# Patient Record
Sex: Male | Born: 1970 | Race: White | Hispanic: No | Marital: Married | Smoking: Former smoker
Health system: Southern US, Community
[De-identification: ages and names within clinical notes are randomized; demographics above are authoritative.]

## PROBLEM LIST (undated history)

## (undated) DIAGNOSIS — F329 Major depressive disorder, single episode, unspecified: Secondary | ICD-10-CM

## (undated) DIAGNOSIS — E785 Hyperlipidemia, unspecified: Secondary | ICD-10-CM

## (undated) DIAGNOSIS — E119 Type 2 diabetes mellitus without complications: Secondary | ICD-10-CM

## (undated) DIAGNOSIS — G4730 Sleep apnea, unspecified: Secondary | ICD-10-CM

## (undated) DIAGNOSIS — F419 Anxiety disorder, unspecified: Secondary | ICD-10-CM

## (undated) DIAGNOSIS — I10 Essential (primary) hypertension: Secondary | ICD-10-CM

## (undated) DIAGNOSIS — Z951 Presence of aortocoronary bypass graft: Secondary | ICD-10-CM

## (undated) DIAGNOSIS — R350 Frequency of micturition: Secondary | ICD-10-CM

## (undated) DIAGNOSIS — R519 Headache, unspecified: Secondary | ICD-10-CM

## (undated) DIAGNOSIS — F32A Depression, unspecified: Secondary | ICD-10-CM

## (undated) DIAGNOSIS — K219 Gastro-esophageal reflux disease without esophagitis: Secondary | ICD-10-CM

## (undated) DIAGNOSIS — R51 Headache: Secondary | ICD-10-CM

## (undated) DIAGNOSIS — I2119 ST elevation (STEMI) myocardial infarction involving other coronary artery of inferior wall: Secondary | ICD-10-CM

## (undated) DIAGNOSIS — I5042 Chronic combined systolic (congestive) and diastolic (congestive) heart failure: Secondary | ICD-10-CM

## (undated) DIAGNOSIS — I255 Ischemic cardiomyopathy: Secondary | ICD-10-CM

## (undated) DIAGNOSIS — Z8701 Personal history of pneumonia (recurrent): Secondary | ICD-10-CM

## (undated) DIAGNOSIS — I2510 Atherosclerotic heart disease of native coronary artery without angina pectoris: Secondary | ICD-10-CM

## (undated) DIAGNOSIS — Z973 Presence of spectacles and contact lenses: Secondary | ICD-10-CM

## (undated) DIAGNOSIS — Z95810 Presence of automatic (implantable) cardiac defibrillator: Secondary | ICD-10-CM

## (undated) HISTORY — DX: Type 2 diabetes mellitus without complications: E11.9

## (undated) HISTORY — DX: Chronic combined systolic (congestive) and diastolic (congestive) heart failure: I50.42

## (undated) HISTORY — DX: Anxiety disorder, unspecified: F41.9

## (undated) HISTORY — DX: Morbid (severe) obesity due to excess calories: E66.01

## (undated) HISTORY — DX: ST elevation (STEMI) myocardial infarction involving other coronary artery of inferior wall: I21.19

## (undated) HISTORY — DX: Essential (primary) hypertension: I10

## (undated) HISTORY — PX: EYE SURGERY: SHX253

## (undated) HISTORY — PX: COLONOSCOPY: SHX174

## (undated) HISTORY — DX: Hyperlipidemia, unspecified: E78.5

## (undated) HISTORY — DX: Atherosclerotic heart disease of native coronary artery without angina pectoris: I25.10

## (undated) HISTORY — DX: Depression, unspecified: F32.A

## (undated) HISTORY — PX: FINGER SURGERY: SHX640

## (undated) HISTORY — DX: Ischemic cardiomyopathy: I25.5

## (undated) HISTORY — DX: Presence of aortocoronary bypass graft: Z95.1

## (undated) HISTORY — DX: Major depressive disorder, single episode, unspecified: F32.9

---
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
## 2007-10-04 DIAGNOSIS — I10 Essential (primary) hypertension: Secondary | ICD-10-CM | POA: Insufficient documentation

## 2008-04-05 DIAGNOSIS — I5042 Chronic combined systolic (congestive) and diastolic (congestive) heart failure: Secondary | ICD-10-CM

## 2008-04-05 HISTORY — PX: CORONARY STENT INTERVENTION: CATH118234

## 2008-04-05 HISTORY — DX: Chronic combined systolic (congestive) and diastolic (congestive) heart failure: I50.42

## 2008-07-07 DIAGNOSIS — I2119 ST elevation (STEMI) myocardial infarction involving other coronary artery of inferior wall: Secondary | ICD-10-CM

## 2008-07-07 HISTORY — DX: ST elevation (STEMI) myocardial infarction involving other coronary artery of inferior wall: I21.19

## 2008-07-08 HISTORY — PX: OTHER SURGICAL HISTORY: SHX169

## 2008-11-03 DIAGNOSIS — I255 Ischemic cardiomyopathy: Secondary | ICD-10-CM | POA: Insufficient documentation

## 2011-04-06 DIAGNOSIS — Z95810 Presence of automatic (implantable) cardiac defibrillator: Secondary | ICD-10-CM

## 2011-04-06 HISTORY — DX: Presence of automatic (implantable) cardiac defibrillator: Z95.810

## 2011-04-06 HISTORY — PX: PACEMAKER INSERTION: SHX728

## 2011-10-04 DIAGNOSIS — I5042 Chronic combined systolic (congestive) and diastolic (congestive) heart failure: Secondary | ICD-10-CM | POA: Insufficient documentation

## 2011-10-04 DIAGNOSIS — Z95810 Presence of automatic (implantable) cardiac defibrillator: Secondary | ICD-10-CM | POA: Insufficient documentation

## 2014-04-05 DIAGNOSIS — Z8701 Personal history of pneumonia (recurrent): Secondary | ICD-10-CM

## 2014-04-05 HISTORY — DX: Personal history of pneumonia (recurrent): Z87.01

## 2014-05-02 ENCOUNTER — Encounter: Payer: Self-pay | Admitting: Neurology

## 2014-05-06 ENCOUNTER — Encounter: Payer: Self-pay | Admitting: Neurology

## 2014-05-06 ENCOUNTER — Ambulatory Visit (INDEPENDENT_AMBULATORY_CARE_PROVIDER_SITE_OTHER): Payer: 59 | Admitting: Neurology

## 2014-05-06 VITALS — BP 124/81 | HR 87 | Temp 98.0°F | Resp 16 | Ht 69.0 in | Wt 300.0 lb

## 2014-05-06 DIAGNOSIS — IMO0002 Reserved for concepts with insufficient information to code with codable children: Secondary | ICD-10-CM

## 2014-05-06 DIAGNOSIS — G4733 Obstructive sleep apnea (adult) (pediatric): Secondary | ICD-10-CM

## 2014-05-06 DIAGNOSIS — Z955 Presence of coronary angioplasty implant and graft: Secondary | ICD-10-CM

## 2014-05-06 DIAGNOSIS — E669 Obesity, unspecified: Secondary | ICD-10-CM

## 2014-05-06 DIAGNOSIS — I5040 Unspecified combined systolic (congestive) and diastolic (congestive) heart failure: Secondary | ICD-10-CM

## 2014-05-06 DIAGNOSIS — Z95810 Presence of automatic (implantable) cardiac defibrillator: Secondary | ICD-10-CM

## 2014-05-06 DIAGNOSIS — E1165 Type 2 diabetes mellitus with hyperglycemia: Secondary | ICD-10-CM

## 2014-05-06 DIAGNOSIS — G4734 Idiopathic sleep related nonobstructive alveolar hypoventilation: Secondary | ICD-10-CM

## 2014-05-06 NOTE — Patient Instructions (Signed)

## 2014-05-06 NOTE — Progress Notes (Signed)
Subjective:    Patient ID: Anibal Quinby is a 44 y.o. male.  HPI     Huston Foley, MD, PhD [facility] [address], [address] [address] [place], [place] [address]  Dear Vonna Kotyk,   I saw your patient, July Nickson, upon your kind request in my neurologic clinic today for initial consultation of his sleep disorder, in particular, concern for underlying obstructive sleep apnea. The patient is unaccompanied today. As you know, Mr. Bohman is a 44 year old right-handed gentleman with an underlying medical history of type 2 diabetes, chronic combined systolic and diastolic heart failure, ischemic cardiomyopathy, morbid obesity, and coronary artery disease, status post MI in 2010 at age 34, status post pacemaker/defibrillator placement in 2013 and s/p cardiac stent placements, who has a history of snoring and reports daytime somnolence. He had a recent overnight pulse oximetry test on 04/22/2014 which I reviewed: Total test time was 6 hours and 37 minutes, average oxygen saturation 91.3%, lowest oxygen saturation 50%, time below 88% saturation was 85.4 minutes.  He moved here from [place]. He tells me that he was actually diagnosed with obstructive sleep apnea with a sleep study over a year ago when he was still residing in [place]. He does not have the actual test results. He was tried on CPAP at night. He had trouble tolerating it but would be willing to come back for diagnosis and treatment with another sleep study. He estimates that his sleep study was well over a year ago. He has gained a lot of weight in the last year because of increase in his insulin dose. He has had trouble with diabetes control. He's had diabetes for about 14 years but thankfully does not endorse any complications from diabetes. He has not seen an ophthalmologist in over a year. He had a recent echocardiogram which he reports showed an EF of 50%. His echocardiogram from August 2014 showed an EF  of 35%. He does not endorse any chest pain or shortness of breath at this time. He endorses loud snoring, gasping sensations while asleep, witnessed apneic pauses and frequent morning headaches.  Her typical bedtime is reported to be around 8 to 9 PM and usual wake time is around 4:30 AM. Sleep onset typically occurs within minutes. He reports feeling poorly rested upon awakening. He wakes up on an average 3 to 4 times in the middle of the night and has to go to the bathroom 3 to 4 times on a typical night. He admits to frequent morning headaches. He has to be at work at 5 AM. He works as an Nature conservation officer at [employer].  He reports excessive daytime somnolence (EDS) and His Epworth Sleepiness Score (ESS) is 18/24 today. He has fallen asleep while driving, in the past, on longer distances. He knows to stop and pull over if he feels sleepy at the wheel. He definitely falls asleep when he is a passenger. He does not take any scheduled. He suspects that his father has sleep apnea but he has not been formally tested. He has a family history of heart disease in his father had heart transplant.  He drinks unsweet tea maybe a glass a day. He drinks alcohol maybe at the most once per month. He quit smoking on 07/07/2008 when he had his heart attack. He had 3 coronary stents placed on 07/08/2008. He denies cataplexy, sleep paralysis, hypnagogic or hypnopompic hallucinations, or sleep attacks. He does not report any vivid dreams, nightmares, dream enactments, or parasomnias,  such as sleep walking but does report some sleep talking.   His wife and 52 yo stepdaughter are still in [place]. He does not have a TV in his bedroom.   His Past Medical History Is Significant For: Past Medical History  Diagnosis Date  . Diabetes mellitus without complication     type ll,uncontrolled with renal complications  . Heart failure     chronic combined systolic and diastolic  . Ischemic cardiomyopathy   . Morbid  obesity   . Atherosclerosis   . Hyperlipemia   . Hypertension     essential  . Cardiac defibrillator in place   . Depression   . Anxiety     His Past Surgical History Is Significant For: Past Surgical History  Procedure Laterality Date  . Eye surgery Left     age 19,to correct lazy eye  . Mastectomy Bilateral 1985    gynecomastia  . Pacemaker insertion  2013    St Jude,implantable defibrillator  . Stents  07/08/2008    3     His Family History Is Significant For: Family History  Problem Relation Age of Onset  . Heart Problems Father     transplant at age 38  . Kidney failure Father   . Dementia Father     His Social History Is Significant For: History   Social History  . Marital Status: Married    Spouse Name: Maralyn Sago    Number of Children: 1  . Years of Education: college   Occupational History  . 1    Social History Main Topics  . Smoking status: Former Games developer  . Smokeless tobacco: Never Used     Comment: quit in 2010  . Alcohol Use: No  . Drug Use: No  . Sexual Activity: None   Other Topics Concern  . None   Social History Narrative    His Allergies Are:  Allergies  Allergen Reactions  . Penicillins Shortness Of Breath  . Sulfa Antibiotics Swelling and Rash  :   His Current Medications Are:  Outpatient Encounter Prescriptions as of 05/06/2014  Medication Sig  . aspirin 325 MG tablet Take 325 mg by mouth daily.  . carvedilol (COREG) 12.5 MG tablet Take 12.5 mg by mouth 2 (two) times daily with a meal.  . CLOPIDOGREL BISULFATE PO Take 75 mg by mouth daily.  Marland Kitchen FLUoxetine (PROZAC) 40 MG capsule Take 40 mg by mouth daily.  . Insulin Detemir (LEVEMIR) 100 UNIT/ML Pen Inject into the skin daily at 10 pm. Inject 40 units  Subcutaneous nightly  . insulin lispro (HUMALOG) 100 UNIT/ML KiwkPen Inject into the skin. Inject 30 units subcutaneous three times daily  . lansoprazole (PREVACID) 15 MG capsule Take 15 mg by mouth daily at 12 noon.  Marland Kitchen LOSARTAN  POTASSIUM PO Take 50 mg by mouth daily.  Marland Kitchen METFORMIN HCL ER PO Take 500 mg by mouth 2 (two) times daily. At bedtime  . oxymetazoline (AFRIN) 0.05 % nasal spray Place into the nose. As needed  . OXYMETAZOLINE HCL, OPHTH, 0.025 % SOLN Apply to eye. 2 sprays each nostril two times daily  . rosuvastatin (CRESTOR) 40 MG tablet Take 40 mg by mouth daily.  :  Review of Systems:  Out of a complete 14 point review of systems, all are reviewed and negative with the exception of these symptoms as listed below:   Review of Systems  Constitutional: Positive for fatigue.  HENT:       Ringing in ears  Respiratory:  Positive for shortness of breath.        Snoring  Genitourinary:       Impotence  Allergic/Immunologic: Positive for environmental allergies.  Neurological: Positive for headaches.       Sleepiness, restless legs  Psychiatric/Behavioral:       Depression, anxiety,, decreased energy, disinterest in activities    Objective:  Neurologic Exam  Physical Exam Physical Examination:   Filed Vitals:   05/06/14 1251  BP: 124/81  Pulse: 87  Temp: 98 F (36.7 C)  Resp: 16    General Examination: The patient is a very pleasant 44 y.o. male in no acute distress. He appears well-developed and well-nourished and adequately groomed. He is obese.   HEENT: Normocephalic, atraumatic, pupils are equal, round and reactive to light and accommodation. Funduscopic exam is normal with sharp disc margins noted. Extraocular tracking is good without limitation to gaze excursion or nystagmus noted. Normal smooth pursuit is noted. Hearing is grossly intact. Tympanic membranes are clear bilaterally. Face is symmetric with normal facial animation and normal facial sensation. Speech is clear with no dysarthria noted. There is no hypophonia. There is no lip, neck/head, jaw or voice tremor. Neck is supple with full range of passive and active motion. There are no carotid bruits on auscultation. Oropharynx exam  reveals: mild mouth dryness, adequate dental hygiene and moderate airway crowding, due to redundant soft palate and elongated uvula, tonsils are 1+. Mallampati is class III. Tongue protrudes centrally and palate elevates symmetrically. Neck size is 18-1/4 inches.   Chest: Clear to auscultation without wheezing, rhonchi or crackles noted.  Heart: S1+S2+0, regular and normal without murmurs, rubs or gallops noted.   Abdomen: Soft, non-tender and non-distended with normal bowel sounds appreciated on auscultation.  Extremities: There is trace pitting edema in the distal lower extremities bilaterally. Pedal pulses are intact.  Skin: Warm and dry without trophic changes noted. There are some varicose veins.  Musculoskeletal: exam reveals no obvious joint deformities, tenderness or joint swelling or erythema.   Neurologically:  Mental status: The patient is awake, alert and oriented in all 4 spheres. His immediate and remote memory, attention, language skills and fund of knowledge are appropriate. There is no evidence of aphasia, agnosia, apraxia or anomia. Speech is clear with normal prosody and enunciation. Thought process is linear. Mood is normal and affect is normal.  Cranial nerves II - XII are as described above under HEENT exam. In addition: shoulder shrug is normal with equal shoulder height noted. Motor exam: Normal bulk, strength and tone is noted. There is no drift, tremor or rebound. Romberg is negative. Reflexes are 2+ throughout. Fine motor skills and coordination: intact with normal finger taps, normal hand movements, normal rapid alternating patting, normal foot taps and normal foot agility.  Cerebellar testing: No dysmetria or intention tremor on finger to nose testing. Heel to shin is unremarkable bilaterally. There is no truncal or gait ataxia.  Sensory exam: intact to light touch, pinprick, vibration, temperature sense in the upper and lower extremities.  Gait, station and balance:  He stands easily. No veering to one side is noted. No leaning to one side is noted. Posture is age-appropriate and stance is narrow based. Gait shows normal stride length and normal pace. No problems turning are noted. He turns en bloc. Tandem walk is unremarkable.               Assessment and Plan:  In summary, Tanor Glaspy is a very pleasant 44 y.o.-year old male  with an underlying medical history of type 2 diabetes, chronic combined systolic and diastolic heart failure, ischemic cardiomyopathy, morbid obesity, and coronary artery disease, status post MI in 2010 at age 89, status post pacemaker/defibrillator placement in 2013 and s/p cardiac stent placements, whose history and physical exam are in keeping with obstructive sleep apnea (OSA). His recent overnight pulse oximetry test shows severe desaturations. I had a long chat with the patient  about my findings and the diagnosis of OSA, its prognosis and treatment options. We talked about medical treatments, surgical interventions and non-pharmacological approaches. I explained in particular the risks and ramifications of untreated moderate to severe OSA, especially with respect to developing cardiovascular disease down the Road, including congestive heart failure, difficult to treat hypertension, cardiac arrhythmias, or stroke. Even type 2 diabetes has, in part, been linked to untreated OSA. Symptoms of untreated OSA include daytime sleepiness, memory problems, mood irritability and mood disorder such as depression and anxiety, lack of energy, as well as recurrent headaches, especially morning headaches. We talked about trying to maintain a healthy lifestyle in general, as well as the importance of weight control. I encouraged the patient to eat healthy, exercise daily and keep well hydrated, to keep a scheduled bedtime and wake time routine, to not skip any meals and eat healthy snacks in between meals. I advised the patient not to drive when feeling  sleepy. I recommended the following at this time: sleep study with potential positive airway pressure titration. (We will score hypopneas at 4% and split the sleep study into diagnostic and treatment portion, if the estimated. 2 hour AHI is >20/h).   I explained the sleep test procedure to the patient and also outlined possible surgical and non-surgical treatment options of OSA, including the use of a custom-made dental device (which would require a referral to a specialist dentist or oral surgeon), upper airway surgical options, such as pillar implants, radiofrequency surgery, tongue base surgery, and UPPP (which would involve a referral to an ENT surgeon). Rarely, jaw surgery such as mandibular advancement may be considered.  I also explained the CPAP treatment option to the patient, who indicated that he would be willing to try CPAP if the need arises. I explained the importance of being compliant with PAP treatment, not only for insurance purposes but primarily to improve His symptoms, and for the patient's long term health benefit, including to reduce His cardiovascular risks. I answered all his questions today and the patientwas in agreement. I would like to see him back after the sleep study is completed and encouraged him to call with any interim questions, concerns, problems or updates.   Thank you very much for allowing me to participate in the care of this nice patient. If I can be of any further assistance to you please do not hesitate to call me at [phone].  Sincerely,   Huston FoleySaima Ezechiel [place]ooksbury, MD, PhD

## 2014-05-25 ENCOUNTER — Encounter: Payer: 59 | Admitting: Neurology

## 2014-05-26 ENCOUNTER — Telehealth: Payer: Self-pay | Admitting: *Deleted

## 2014-05-26 NOTE — Telephone Encounter (Signed)
Pt was called, no answer

## 2014-06-24 ENCOUNTER — Telehealth: Payer: Self-pay | Admitting: Neurology

## 2014-06-24 ENCOUNTER — Ambulatory Visit (INDEPENDENT_AMBULATORY_CARE_PROVIDER_SITE_OTHER): Payer: 59 | Admitting: Neurology

## 2014-06-24 VITALS — BP 137/87 | HR 85 | Resp 14

## 2014-06-24 DIAGNOSIS — R9431 Abnormal electrocardiogram [ECG] [EKG]: Secondary | ICD-10-CM

## 2014-06-24 DIAGNOSIS — G4730 Sleep apnea, unspecified: Secondary | ICD-10-CM

## 2014-06-24 DIAGNOSIS — G4733 Obstructive sleep apnea (adult) (pediatric): Secondary | ICD-10-CM

## 2014-06-24 DIAGNOSIS — G4710 Hypersomnia, unspecified: Secondary | ICD-10-CM

## 2014-06-24 DIAGNOSIS — G479 Sleep disorder, unspecified: Secondary | ICD-10-CM

## 2014-06-24 NOTE — Telephone Encounter (Signed)
Patient has confirmed appointment for tonight's sleep study.

## 2014-06-25 NOTE — Sleep Study (Signed)
Please see the scanned sleep study interpretation located in the Procedure tab within the Chart Review section. 

## 2014-07-10 ENCOUNTER — Telehealth: Payer: Self-pay | Admitting: Neurology

## 2014-07-10 DIAGNOSIS — G4733 Obstructive sleep apnea (adult) (pediatric): Secondary | ICD-10-CM

## 2014-07-10 NOTE — Telephone Encounter (Signed)
Please call and notify patient that the recent sleep study confirmed the diagnosis of severe OSA. He did very well with CPAP during the study with significant improvement of the respiratory events. Therefore, I would like start the patient on CPAP at home. I placed the order in the chart.   Arrange for CPAP set up at home through a DME company of patient's choice and fax/route report to PCP and referring MD (if other than PCP).   The patient will also need a follow up appointment with me in 6-8 weeks post set up that has to be scheduled; help the patient schedule this (in a follow-up slot).   Please re-enforce the importance of compliance with treatment and the need for us to monitor compliance data.   Once you have spoken to the patient and scheduled the return appointment, you may close this encounter, thanks,   Tomorrow Dehaas, MD, PhD [facility] ([facility])    

## 2014-07-11 ENCOUNTER — Encounter: Payer: Self-pay | Admitting: Neurology

## 2014-07-11 ENCOUNTER — Encounter: Payer: Self-pay | Admitting: *Deleted

## 2014-07-11 NOTE — Telephone Encounter (Signed)
Patient contacted our office inquiring of his sleep study results.  The patient was informed that a diagnosis of severe OSA had been determined by Dr. Frances FurbishAthar, yet the CPAP therapy was effective in treatment.  The patient was understanding and was referred to [facility] for CPAP set up.  Dr. Jacinto HalimGanji was faxed a copy of the sleep study results.   Patient instructed to contact our office 6-8 weeks post set up to schedule a follow up appointment.  The patient gave verbal permission to mail a copy of his test results.

## 2014-08-20 ENCOUNTER — Telehealth: Payer: Self-pay | Admitting: Neurology

## 2014-08-20 NOTE — Telephone Encounter (Signed)
pls call patient to make FU appt in sleep clinic. He is not fully compliant with his CPAP and no appt appears to be pending.

## 2014-08-21 NOTE — Telephone Encounter (Signed)
Pt aware of CPAP download results. He made appt for 5/26.

## 2014-08-29 ENCOUNTER — Ambulatory Visit (INDEPENDENT_AMBULATORY_CARE_PROVIDER_SITE_OTHER): Payer: 59 | Admitting: Neurology

## 2014-08-29 ENCOUNTER — Encounter: Payer: Self-pay | Admitting: Neurology

## 2014-08-29 VITALS — BP 110/76 | HR 82 | Resp 18 | Ht 69.0 in | Wt 310.0 lb

## 2014-08-29 DIAGNOSIS — Z955 Presence of coronary angioplasty implant and graft: Secondary | ICD-10-CM | POA: Diagnosis not present

## 2014-08-29 DIAGNOSIS — Z95810 Presence of automatic (implantable) cardiac defibrillator: Secondary | ICD-10-CM | POA: Diagnosis not present

## 2014-08-29 DIAGNOSIS — G4733 Obstructive sleep apnea (adult) (pediatric): Secondary | ICD-10-CM | POA: Diagnosis not present

## 2014-08-29 DIAGNOSIS — E669 Obesity, unspecified: Secondary | ICD-10-CM | POA: Diagnosis not present

## 2014-08-29 DIAGNOSIS — E1165 Type 2 diabetes mellitus with hyperglycemia: Secondary | ICD-10-CM | POA: Diagnosis not present

## 2014-08-29 DIAGNOSIS — Z9989 Dependence on other enabling machines and devices: Principal | ICD-10-CM

## 2014-08-29 DIAGNOSIS — IMO0002 Reserved for concepts with insufficient information to code with codable children: Secondary | ICD-10-CM

## 2014-08-29 NOTE — Patient Instructions (Signed)
Please continue using your CPAP regularly. While your insurance requires that you use CPAP at least 4 hours each night on 70% of the nights, I recommend, that you not skip any nights and use it throughout the night if you can. Getting used to CPAP and staying with the treatment long term does take time and patience and discipline. Untreated obstructive sleep apnea when it is moderate to severe can have an adverse impact on cardiovascular health and raise her risk for heart disease, arrhythmias, hypertension, congestive heart failure, stroke and diabetes. Untreated obstructive sleep apnea causes sleep disruption, nonrestorative sleep, and sleep deprivation. This can have an impact on your day to day functioning and cause daytime sleepiness and impairment of cognitive function, memory loss, mood disturbance, and problems focussing. Using CPAP regularly can improve these symptoms.  I will see you back in 3 months for check up of your sleep apnea. Call or email for questions.

## 2014-08-29 NOTE — Progress Notes (Signed)
Subjective:    Patient ID: Brett Drake is a 44 y.o. male.  HPI     Interim history:   Brett Drake is a 44 year old right-handed gentleman with an underlying medical history of type 2 diabetes, chronic combined systolic and diastolic heart failure, ischemic cardiomyopathy, morbid obesity, and coronary artery disease, status post MI in 2010 at age 50, status post pacemaker/defibrillator placement in 2013 and s/p cardiac stent placements, who presents for follow-up consultation of his obstructive sleep apnea, after his recent sleep study. The patient is unaccompanied today. I first met him on 05/06/2014 at the request of his cardiologist, at which time he reported a prior diagnosis of OSA but intolerance to CPAP in the past. He reported weight gain. He had also had an abnormal overnight pulse oximetry test through his cardiologist's which I reviewed at the time. I invited him back for sleep study. He had a split-night sleep study on 06/24/2014 and went over his test results with him in detail today. His baseline sleep efficiency of was reduced at 65.4% with a latency to sleep of 16.5 minutes and wake after sleep onset of 26 minutes with moderate sleep fragmentation noted. He had absence of slow-wave sleep and absence of REM sleep during the baseline portion of the study. He had frequent PVCs and PACs on EKG. He had moderate snoring. Total AHI was highly elevated at 100.6 per hour. Average oxygen saturation was 90%, nadir was 70%. He was therefore titrated on CPAP during the later portion of the study. His arousal index improved. He achieved slow-wave sleep and REM sleep. Average oxygen saturations improved to 94%, nadir was 85%. He was titrated on CPAP from 5 cm to 10 cm of water pressure, AHI was reduced to 5.9 events per hour at the final pressure with supine REM sleep achieved. Based on the test results I prescribed CPAP therapy for home use at a pressure of 11 cm due to residual sleep disordered  breathing noted on the final pressure of 10 cm.  Today, 08/29/2014: I reviewed his CPAP compliance data from 07/29/2014 through 08/27/2014 which is a total of 30 days during which time he used his machine 27 days with percent used days rated and 4 hours at 47%, indicating suboptimal compliance with an average usage for all nights of 3 hours and 24 minutes only. Residual AHI good at 2.6 per hour with leak low at 2.2 L/m for the 95th percentile and a pressure of 11 cm with EPR of 2. It does look like he has increased his CPAP usage in the last 2 weeks.  Today, 08/29/2014: He reports that he had difficulty adjusting to CPAP but he is getting better with it. In the past 10-14 days he has used CPAP consistently. He had the nasal pillows initially but had sores around the nostrils and did not like the nasal pillows. He switched to the nose mask which he is tolerating well. He feels better rested. He wakes up with more energy. He is overall pleased with how he is doing. He has been seen a nutritionist and has been able to lose about 5 pounds so far. He had some blood in the stool. He is scheduled for colonoscopy next week. He has a family history of colon cancer. His hemoglobin A1c has come down from 11 previously to 8.8, which was checked this week. He has had some changes in his diabetes medications.  Previously:   He has a history of snoring and reports daytime somnolence. He  had an overnight pulse oximetry test on 04/22/2014: Total test time was 6 hours and 37 minutes, average oxygen saturation 91.3%, lowest oxygen saturation 50%, time below 88% saturation was 85.4 minutes.  He moved here from [place]. He tells me that he was actually diagnosed with obstructive sleep apnea with a sleep study over a year ago when he was still residing in [place]. He does not have the actual test results. He was tried on CPAP at night. He had trouble tolerating it but would be willing to come back for diagnosis and treatment  with another sleep study. He estimates that his sleep study was well over a year ago. He has gained a lot of weight in the last year because of increase in his insulin dose. He has had trouble with diabetes control. He's had diabetes for about 14 years but thankfully does not endorse any complications from diabetes. He has not seen an ophthalmologist in over a year. He had a recent echocardiogram which he reports showed an EF of 50%. His echocardiogram from August 2014 showed an EF of 35%. He does not endorse any chest pain or shortness of breath at this time. He endorses loud snoring, gasping sensations while asleep, witnessed apneic pauses and frequent morning headaches.  Her typical bedtime is reported to be around 8 to 9 PM and usual wake time is around 4:30 AM. Sleep onset typically occurs within minutes. He reports feeling poorly rested upon awakening. He wakes up on an average 3 to 4 times in the middle of the night and has to go to the bathroom 3 to 4 times on a typical night. He admits to frequent morning headaches. He has to be at work at 5 AM. He works as an Sales promotion account executive at [employer]  He reports excessive daytime somnolence (EDS) and His Epworth Sleepiness Score (ESS) is 18/24 today. He has fallen asleep while driving, in the past, on longer distances. He knows to stop and pull over if he feels sleepy at the wheel. He definitely falls asleep when he is a passenger. He does not take any scheduled. He suspects that his father has sleep apnea but he has not been formally tested. He has a family history of heart disease in his father had heart transplant.   He drinks unsweet tea maybe a glass a day. He drinks alcohol maybe at the most once per month. He quit smoking on 07/07/2008 when he had his heart attack. He had 3 coronary stents placed on 07/08/2008. He denies cataplexy, sleep paralysis, hypnagogic or hypnopompic hallucinations, or sleep attacks. He does not report any vivid dreams,  nightmares, dream enactments, or parasomnias, such as sleep walking but does report some sleep talking.   His wife and 24 yo stepdaughter are still in [place]. He does not have a TV in his bedroom.    His Past Medical History Is Significant For: Past Medical History  Diagnosis Date  . Diabetes mellitus without complication     type ll,uncontrolled with renal complications  . Heart failure     chronic combined systolic and diastolic  . Ischemic cardiomyopathy   . Morbid obesity   . Atherosclerosis   . Hyperlipemia   . Hypertension     essential  . Cardiac defibrillator in place   . Depression   . Anxiety     His Past Surgical History Is Significant For: Past Surgical History  Procedure Laterality Date  . Eye surgery Left     age  10,to correct lazy eye  . Mastectomy Bilateral 1985    gynecomastia  . Pacemaker insertion  2013    [employer] Jude,implantable defibrillator  . Stents  07/08/2008    3     His Family History Is Significant For: Family History  Problem Relation Age of Onset  . Heart Problems Father     transplant at age 23  . Kidney failure Father   . Dementia Father     His Social History Is Significant For: History   Social History  . Marital Status: Married    Spouse Name: Judson Roch  . Number of Children: 1  . Years of Education: college   Occupational History  . 1    Social History Main Topics  . Smoking status: Former Research scientist (life sciences)  . Smokeless tobacco: Never Used     Comment: quit in 2010  . Alcohol Use: No  . Drug Use: No  . Sexual Activity: Not on file   Other Topics Concern  . None   Social History Narrative    His Allergies Are:  Allergies  Allergen Reactions  . Penicillins Shortness Of Breath  . Sulfa Antibiotics Swelling and Rash  :   His Current Medications Are:  Outpatient Encounter Prescriptions as of 08/29/2014  Medication Sig  . aspirin 325 MG tablet Take 325 mg by mouth daily.  . carvedilol (COREG) 12.5 MG tablet Take 12.5 mg by  mouth 2 (two) times daily with a meal.  . CLOPIDOGREL BISULFATE PO Take 75 mg by mouth daily.  Marland Kitchen FLUoxetine (PROZAC) 40 MG capsule Take 40 mg by mouth daily.  . Insulin Degludec 200 UNIT/ML SOPN Inject into the skin.  Marland Kitchen insulin lispro (HUMALOG) 100 UNIT/ML KiwkPen Inject into the skin. Inject 30 units subcutaneous three times daily  . lansoprazole (PREVACID) 15 MG capsule Take 15 mg by mouth daily at 12 noon.  Marland Kitchen LOSARTAN POTASSIUM PO Take 50 mg by mouth daily.  Marland Kitchen METFORMIN HCL ER PO Take 500 mg by mouth 2 (two) times daily. At bedtime  . oxymetazoline (AFRIN) 0.05 % nasal spray Place into the nose. As needed  . OXYMETAZOLINE HCL, OPHTH, 0.025 % SOLN Apply to eye. 2 sprays each nostril two times daily  . rosuvastatin (CRESTOR) 40 MG tablet Take 40 mg by mouth daily.  . [DISCONTINUED] Insulin Detemir (LEVEMIR) 100 UNIT/ML Pen Inject into the skin daily at 10 pm. Inject 40 units  Subcutaneous nightly   No facility-administered encounter medications on file as of 08/29/2014.  : Review of Systems:  Out of a complete 14 point review of systems, all are reviewed and negative with the exception of these symptoms as listed below:  Review of Systems  All other systems reviewed and are negative.  Objective:  Neurologic Exam  Physical Exam Physical Examination:   Filed Vitals:   08/29/14 1521  BP: 110/76  Pulse: 82  Resp: 18   General Examination: The patient is a very pleasant 44 y.o. male in no acute distress. He appears well-developed and well-nourished and adequately groomed. He is obese.   HEENT: Normocephalic, atraumatic, pupils are equal, round and reactive to light and accommodation. Funduscopic exam is normal with sharp disc margins noted. Extraocular tracking is good without limitation to gaze excursion or nystagmus noted. Normal smooth pursuit is noted. Hearing is grossly intact. Face is symmetric with normal facial animation and normal facial sensation. Speech is clear with no  dysarthria noted. There is no hypophonia. There is no lip, neck/head, jaw or voice  tremor. Neck is supple with full range of passive and active motion. There are no carotid bruits on auscultation. Oropharynx exam reveals: mild mouth dryness, adequate dental hygiene and moderate airway crowding, due to redundant soft palate and elongated uvula, tonsils are 1+. Mallampati is class III. Tongue protrudes centrally and palate elevates symmetrically.    Chest: Clear to auscultation without wheezing, rhonchi or crackles noted.  Heart: S1+S2+0, regular and normal without murmurs, rubs or gallops noted.   Abdomen: Soft, non-tender and non-distended with normal bowel sounds appreciated on auscultation.  Extremities: There is no pitting edema in the distal lower extremities bilaterally. Pedal pulses are intact.  Skin: Warm and dry without trophic changes noted. There are some varicose veins.  Musculoskeletal: exam reveals no obvious joint deformities, tenderness or joint swelling or erythema.   Neurologically:  Mental status: The patient is awake, alert and oriented in all 4 spheres. His immediate and remote memory, attention, language skills and fund of knowledge are appropriate. There is no evidence of aphasia, agnosia, apraxia or anomia. Speech is clear with normal prosody and enunciation. Thought process is linear. Mood is normal and affect is normal.  Cranial nerves II - XII are as described above under HEENT exam. In addition: shoulder shrug is normal with equal shoulder height noted. Motor exam: Normal bulk, strength and tone is noted. There is no drift, tremor or rebound. Romberg is negative. Reflexes are 2+ throughout. Fine motor skills and coordination: intact with normal finger taps, normal hand movements, normal rapid alternating patting, normal foot taps and normal foot agility.  Cerebellar testing: No dysmetria or intention tremor on finger to nose testing. Heel to shin is unremarkable  bilaterally. There is no truncal or gait ataxia.  Sensory exam: intact to light touch, pinprick, vibration, temperature sense in the upper and lower extremities.  Gait, station and balance: He stands easily. No veering to one side is noted. No leaning to one side is noted. Posture is age-appropriate and stance is narrow based. Gait shows normal stride length and normal pace. No problems turning are noted. He turns en bloc. Tandem walk is unremarkable.               Assessment and Plan:  In summary, Brett Drake is a very pleasant 44 year old male with an underlying medical history of type 2 diabetes, chronic combined systolic and diastolic heart failure, ischemic cardiomyopathy, morbid obesity, and coronary artery disease, status post MI in 2010 at age 66, status post pacemaker/defibrillator placement in 2013 and s/p cardiac stent placements, who presents for follow-up consultation of his severe obstructive sleep apnea. He had evidence of severe desaturations, as low as 70%. He has done well with CPAP therapy on a pressure of 11 cm. He had some initial difficulty adjusting to the treatment and has a prior history of CPAP intolerance when he was still living in [place]. Given his previous CPAP intolerance he has done great. He is advised about his recent split-night sleep study results in detail and we also went over his compliance data. His compliance is indeed improved in the last 2 weeks. He is encouraged to continue using CPAP regularly. He is encouraged to try to pursue weight loss and congratulated on his recent weight loss success. His hemoglobin A1c has also come down some. He is working with his primary care provider on his diabetes control. His exam for me is stable and he is reassured.  I again had a long chat with the patient about my  findings and the diagnosis of OSA, its prognosis and treatment options. We talked about medical treatments, surgical interventions and non-pharmacological  approaches. I explained in particular the risks and ramifications of untreated moderate to severe OSA, especially with respect to developing cardiovascular disease down the Road, including congestive heart failure, difficult to treat hypertension, cardiac arrhythmias, or stroke. Even type 2 diabetes has, in part, been linked to untreated OSA. Symptoms of untreated OSA include daytime sleepiness, memory problems, mood irritability and mood disorder such as depression and anxiety, lack of energy, as well as recurrent headaches, especially morning headaches. We talked about trying to maintain a healthy lifestyle in general, as well as the importance of weight control. I encouraged the patient to eat healthy, exercise daily and keep well hydrated, to keep a scheduled bedtime and wake time routine, to not skip any meals and eat healthy snacks in between meals. I advised the patient not to drive when feeling sleepy. I recommended the following at this time: continue CPAP therapy at the current settings on the current mask. I explained the importance of being compliant with PAP treatment, not only for insurance purposes but primarily to improve His symptoms, and for the patient's long term health benefit, including to reduce His cardiovascular risks. I answered all his questions today and the patientwas in agreement. I would like to see him back in 3 months, sooner if needed and encouraged him to call with any interim questions, concerns, problems or updates.  I spent 25 minutes in total face-to-face time with the patient, more than 50% of which was spent in counseling and coordination of care, reviewing test results, reviewing medication and discussing or reviewing the diagnosis of OSA, its prognosis and treatment options.

## 2014-12-04 ENCOUNTER — Ambulatory Visit (INDEPENDENT_AMBULATORY_CARE_PROVIDER_SITE_OTHER): Payer: 59 | Admitting: Neurology

## 2014-12-04 ENCOUNTER — Encounter: Payer: Self-pay | Admitting: Neurology

## 2014-12-04 VITALS — BP 122/74 | HR 78 | Resp 18 | Ht 69.0 in | Wt 317.0 lb

## 2014-12-04 DIAGNOSIS — E669 Obesity, unspecified: Secondary | ICD-10-CM

## 2014-12-04 DIAGNOSIS — Z955 Presence of coronary angioplasty implant and graft: Secondary | ICD-10-CM | POA: Diagnosis not present

## 2014-12-04 DIAGNOSIS — Z95810 Presence of automatic (implantable) cardiac defibrillator: Secondary | ICD-10-CM | POA: Diagnosis not present

## 2014-12-04 DIAGNOSIS — Z9989 Dependence on other enabling machines and devices: Principal | ICD-10-CM

## 2014-12-04 DIAGNOSIS — G4733 Obstructive sleep apnea (adult) (pediatric): Secondary | ICD-10-CM | POA: Diagnosis not present

## 2014-12-04 NOTE — Progress Notes (Signed)
Subjective:    Patient ID: Brett Drake is a 44 y.o. male.  HPI     Interim history:   Brett Drake is a 44 year old right-handed gentleman with an underlying medical history of type 2 diabetes, chronic combined systolic and diastolic heart failure, ischemic cardiomyopathy, morbid obesity, and coronary artery disease, status post MI in 2010 at age 34, status post pacemaker/defibrillator placement in 2013 and s/p cardiac stent placements, who presents for follow-up consultation of his obstructive sleep apnea, on treatment with CPAP. The patient is unaccompanied today. I last saw him on 08/29/2014, at which time we talked about the sleep test results and his compliance data with CPAP therapy. He reported that he had some difficulty adjusting to CPAP therapy but he was getting better. He had used CPAP fairly consistently in the previous 2 weeks and preferred the nasal mask. He felt better rested. He felt that he had more daytime energy and overall was quite pleased with how he was doing. He was able to lose some weight. He was scheduled for a colonoscopy soon. His hemoglobin A1c had come down from 11 to 8.8. I encouraged him to be fully compliant with treatment. I commended him for trying and the fact that he had overall done better than in the past when he was tried on CPAP.  Today, 12/04/2014: I reviewed his CPAP compliance data from 11/03/2014 through 12/02/2014 which is a total of 30 days during which time he used his machine every night with percent used days greater than 4 hours at 90%, indicating excellent compliance with an average usage of 5 hours and 47 minutes, residual AHI at 3.4 per hour, leak low with the 95th percentile at 4.7 L/m on a pressure of 11 cm with EPR of 3.  Today, 12/04/2014: He reports doing better. He is compliant with treatment. He has done much better with sleeping with the mask on. He feels better rested. He is still struggling with his weight. Diabetes numbers have  improved. Unfortunately, his insurance did not cover a recent trial of a new diabetes medication. His cardiologist placed him on Lasix. He is scheduled to see a new endocrinologist in December.  Previously:  I first met him on 05/06/2014 at the request of his cardiologist, at which time he reported a prior diagnosis of OSA but intolerance to CPAP in the past. He reported weight gain. He had also had an abnormal overnight pulse oximetry test through his cardiologist's which I reviewed at the time. I invited him back for sleep study. He had a split-night sleep study on 06/24/2014 and went over his test results with him in detail today. His baseline sleep efficiency of was reduced at 65.4% with a latency to sleep of 16.5 minutes and wake after sleep onset of 26 minutes with moderate sleep fragmentation noted. He had absence of slow-wave sleep and absence of REM sleep during the baseline portion of the study. He had frequent PVCs and PACs on EKG. He had moderate snoring. Total AHI was highly elevated at 100.6 per hour. Average oxygen saturation was 90%, nadir was 70%. He was therefore titrated on CPAP during the later portion of the study. His arousal index improved. He achieved slow-wave sleep and REM sleep. Average oxygen saturations improved to 94%, nadir was 85%. He was titrated on CPAP from 5 cm to 10 cm of water pressure, AHI was reduced to 5.9 events per hour at the final pressure with supine REM sleep achieved. Based on the test results I  prescribed CPAP therapy for home use at a pressure of 11 cm due to residual sleep disordered breathing noted on the final pressure of 10 cm.  I reviewed his CPAP compliance data from 07/29/2014 through 08/27/2014 which is a total of 30 days during which time he used his machine 27 days with percent used days rated and 4 hours at 47%, indicating suboptimal compliance with an average usage for all nights of 3 hours and 24 minutes only. Residual AHI good at 2.6 per hour with  leak low at 2.2 L/m for the 95th percentile and a pressure of 11 cm with EPR of 2. It does look like he has increased his CPAP usage in the last 2 weeks.   He has a history of snoring and reports daytime somnolence. He had an overnight pulse oximetry test on 04/22/2014: Total test time was 6 hours and 37 minutes, average oxygen saturation 91.3%, lowest oxygen saturation 50%, time below 88% saturation was 85.4 minutes.   He moved here from [place]. He tells me that he was actually diagnosed with obstructive sleep apnea with a sleep study over a year ago when he was still residing in [place]. He does not have the actual test results. He was tried on CPAP at night. He had trouble tolerating it but would be willing to come back for diagnosis and treatment with another sleep study. He estimates that his sleep study was well over a year ago. He has gained a lot of weight in the last year because of increase in his insulin dose. He has had trouble with diabetes control. He's had diabetes for about 14 years but thankfully does not endorse any complications from diabetes. He has not seen an ophthalmologist in over a year. He had a recent echocardiogram which he reports showed an EF of 50%. His echocardiogram from August 2014 showed an EF of 35%. He does not endorse any chest pain or shortness of breath at this time. He endorses loud snoring, gasping sensations while asleep, witnessed apneic pauses and frequent morning headaches.  Her typical bedtime is reported to be around 8 to 9 PM and usual wake time is around 4:30 AM. Sleep onset typically occurs within minutes. He reports feeling poorly rested upon awakening. He wakes up on an average 3 to 4 times in the middle of the night and has to go to the bathroom 3 to 4 times on a typical night. He admits to frequent morning headaches. He has to be at work at 5 AM. He works as an Sales promotion account executive at [employer]  He reports excessive daytime somnolence (EDS) and  His Epworth Sleepiness Score (ESS) is 18/24 today. He has fallen asleep while driving, in the past, on longer distances. He knows to stop and pull over if he feels sleepy at the wheel. He definitely falls asleep when he is a passenger. He does not take any scheduled. He suspects that his father has sleep apnea but he has not been formally tested. He has a family history of heart disease in his father had heart transplant.   He drinks unsweet tea maybe a glass a day. He drinks alcohol maybe at the most once per month. He quit smoking on 07/07/2008 when he had his heart attack. He had 3 coronary stents placed on 07/08/2008. He denies cataplexy, sleep paralysis, hypnagogic or hypnopompic hallucinations, or sleep attacks. He does not report any vivid dreams, nightmares, dream enactments, or parasomnias, such as sleep walking but does report  some sleep talking.   His wife and 62 yo stepdaughter are still in [place]. He does not have a TV in his bedroom.    His Past Medical History Is Significant For: Past Medical History  Diagnosis Date  . Diabetes mellitus without complication     type ll,uncontrolled with renal complications  . Heart failure     chronic combined systolic and diastolic  . Ischemic cardiomyopathy   . Morbid obesity   . Atherosclerosis   . Hyperlipemia   . Hypertension     essential  . Cardiac defibrillator in place   . Depression   . Anxiety     His Past Surgical History Is Significant For: Past Surgical History  Procedure Laterality Date  . Eye surgery Left     age 74,to correct lazy eye  . Mastectomy Bilateral 1985    gynecomastia  . Pacemaker insertion  2013    [employer] Jude,implantable defibrillator  . Stents  07/08/2008    3     His Family History Is Significant For: Family History  Problem Relation Age of Onset  . Heart Problems Father     transplant at age 52  . Kidney failure Father   . Dementia Father     His Social History Is Significant For: Social  History   Social History  . Marital Status: Married    Spouse Name: Judson Roch  . Number of Children: 1  . Years of Education: college   Occupational History  . 1    Social History Main Topics  . Smoking status: Former Research scientist (life sciences)  . Smokeless tobacco: Never Used     Comment: quit in 2010  . Alcohol Use: No  . Drug Use: No  . Sexual Activity: Not Asked   Other Topics Concern  . None   Social History Narrative    His Allergies Are:  Allergies  Allergen Reactions  . Penicillins Shortness Of Breath  . Sulfa Antibiotics Swelling and Rash  :  His Current Medications Are:  Outpatient Encounter Prescriptions as of 12/04/2014  Medication Sig  . aspirin 325 MG tablet Take 325 mg by mouth daily.  . carvedilol (COREG) 12.5 MG tablet Take 12.5 mg by mouth 2 (two) times daily with a meal.  . carvedilol (COREG) 12.5 MG tablet Take 25 mg by mouth.  . CLOPIDOGREL BISULFATE PO Take 75 mg by mouth daily.  Marland Kitchen FLUoxetine (PROZAC) 40 MG capsule Take 40 mg by mouth daily.  . furosemide (LASIX) 40 MG tablet   . HUMALOG KWIKPEN 200 UNIT/ML SOPN   . insulin lispro (HUMALOG) 100 UNIT/ML KiwkPen Inject into the skin. Inject 30 units subcutaneous three times daily  . lansoprazole (PREVACID) 15 MG capsule Take 15 mg by mouth daily at 12 noon.  Marland Kitchen LOSARTAN POTASSIUM PO Take 50 mg by mouth daily.  Marland Kitchen METFORMIN HCL ER PO Take 500 mg by mouth 2 (two) times daily. At bedtime  . oxymetazoline (AFRIN) 0.05 % nasal spray Place into the nose. As needed  . OXYMETAZOLINE HCL, OPHTH, 0.025 % SOLN Apply to eye. 2 sprays each nostril two times daily  . rosuvastatin (CRESTOR) 40 MG tablet Take 40 mg by mouth daily.   No facility-administered encounter medications on file as of 12/04/2014.  :  Review of Systems:  Out of a complete 14 point review of systems, all are reviewed and negative with the exception of these symptoms as listed below:   Review of Systems  Neurological:  Patient states that he is doing well  on CPAP, no complaints or concerns.     Objective:  Neurologic Exam  Physical Exam Physical Examination:   Filed Vitals:   12/04/14 1619  BP: 122/74  Pulse: 78  Resp: 18   General Examination: The patient is a very pleasant 44 y.o. male in no acute distress. He appears well-developed and well-nourished and adequately groomed. He is obese. He is in good spirits today.  HEENT: Normocephalic, atraumatic, pupils are equal, round and reactive to light and accommodation. Extraocular tracking is good without limitation to gaze excursion or nystagmus noted. Normal smooth pursuit is noted. Hearing is grossly intact. Face is symmetric with normal facial animation and normal facial sensation. Speech is clear with no dysarthria noted. There is no hypophonia. There is no lip, neck/head, jaw or voice tremor. Neck is supple with full range of passive and active motion. There are no carotid bruits on auscultation. Oropharynx exam reveals: mild mouth dryness, adequate dental hygiene and moderate airway crowding, due to redundant soft palate and elongated uvula, tonsils are 1+. Mallampati is class III. Tongue protrudes centrally and palate elevates symmetrically.    Chest: Clear to auscultation without wheezing, rhonchi or crackles noted.  Heart: S1+S2+0, regular and normal without murmurs, rubs or gallops noted.   Abdomen: Soft, non-tender and non-distended with normal bowel sounds appreciated on auscultation.  Extremities: There is no pitting edema in the distal lower extremities bilaterally. Pedal pulses are intact.  Skin: Warm and dry without trophic changes noted. There are some varicose veins.  Musculoskeletal: exam reveals no obvious joint deformities, tenderness or joint swelling or erythema.   Neurologically:  Mental status: The patient is awake, alert and oriented in all 4 spheres. His immediate and remote memory, attention, language skills and fund of knowledge are appropriate. There is no  evidence of aphasia, agnosia, apraxia or anomia. Speech is clear with normal prosody and enunciation. Thought process is linear. Mood is normal and affect is normal.  Cranial nerves II - XII are as described above under HEENT exam. In addition: shoulder shrug is normal with equal shoulder height noted.  Motor exam: Normal bulk, strength and tone is noted. There is no drift, tremor or rebound. Romberg is negative. Reflexes are 2+ throughout. Fine motor skills and coordination: intact with normal finger taps, normal hand movements, normal rapid alternating patting, normal foot taps and normal foot agility.  Cerebellar testing: No dysmetria or intention tremor on finger to nose testing. Heel to shin is unremarkable bilaterally. There is no truncal or gait ataxia.  Sensory exam: intact to light touch in the upper and lower extremities.  Gait, station and balance: He stands easily. No veering to one side is noted. No leaning to one side is noted. Posture is age-appropriate and stance is narrow based. Gait shows normal stride length and normal pace. No problems turning are noted. He turns en bloc. Tandem walk is unremarkable.               Assessment and Plan:  In summary, Brett Drake is a very pleasant 44 year old male with an underlying medical history of type 2 diabetes, chronic combined systolic and diastolic heart failure, ischemic cardiomyopathy, morbid obesity, and coronary artery disease, status post MI in 2010 at age 75, status post pacemaker/defibrillator placement in 2013 and s/p cardiac stent placements, who presents for follow-up consultation of his severe obstructive sleep apnea, now established on CPAP therapy at a pressure of 11 cm with excellent  compliance at this time. He has improved his treatment adherence quite a bit in the last 3 months. He is congratulated on his CPAP compliance and encouraged to continue with treatment without skipping nights and trying to keep the mask on all  night long. He had evidence of severe desaturations, as low as 70% without treatment and these improved significantly with CPAP. He endorses improvement of his sleep and daytime energy level. He did have some initial difficulty adjusting to the treatment and has a prior history of CPAP intolerance when he was still living in [place]. Given his previous CPAP intolerance he has done very well. We briefly went over his sleep study results from March of this year again today.  I explained the importance of being compliant with PAP treatment, not only for insurance purposes but primarily to improve His symptoms, and for the patient's long term health benefit, including to reduce His cardiovascular risks. He is encouraged to work hard on his weight loss and ongoing better diabetes control. He is scheduled to see an endocrinologist in December of this year. From my end of things he has done very well and I suggested a one-year checkup for sleep apnea. I answered all his questions today and he was in agreement. I spent 20 minutes in total face-to-face time with the patient, more than 50% of which was spent in counseling and coordination of care, reviewing test results, reviewing medication and discussing or reviewing the diagnosis of OSA, its prognosis and treatment options.

## 2014-12-04 NOTE — Patient Instructions (Addendum)
Please continue using your CPAP regularly. While your insurance requires that you use CPAP at least 4 hours each night on 70% of the nights, I recommend, that you not skip any nights and use it throughout the night if you can. Getting used to CPAP and staying with the treatment long term does take time and patience and discipline. Untreated obstructive sleep apnea when it is moderate to severe can have an adverse impact on cardiovascular health and raise her risk for heart disease, arrhythmias, hypertension, congestive heart failure, stroke and diabetes. Untreated obstructive sleep apnea causes sleep disruption, nonrestorative sleep, and sleep deprivation. This can have an impact on your day to day functioning and cause daytime sleepiness and impairment of cognitive function, memory loss, mood disturbance, and problems focussing. Using CPAP regularly can improve these symptoms.  Keep up the good work! I will see you back in 12 months for sleep apnea check up.   Please continue to work on improving your diabetes numbers and weight loss.

## 2015-11-23 DIAGNOSIS — I472 Ventricular tachycardia: Secondary | ICD-10-CM

## 2015-11-23 DIAGNOSIS — I2510 Atherosclerotic heart disease of native coronary artery without angina pectoris: Secondary | ICD-10-CM | POA: Diagnosis present

## 2015-11-23 DIAGNOSIS — I4729 Other ventricular tachycardia: Secondary | ICD-10-CM

## 2015-11-23 NOTE — H&P (Signed)
OFFICE VISIT NOTES COPIED TO EPIC FOR DOCUMENTATION  . History of Present Illness Brett Drake AGNP-C; 11/21/2015 7:36 AM) The patient is a 45 year old male who presents for a Follow-up for CAD. He states he had MI in 2010 at age 69. Pt states that he has had no symptoms since MI at age 14. He had a pacemaker/defibrillator placed in 2013.  He denies any chest pain, shortness of breath, PND, orthopnea, edema, palpitations, or symptoms suggestive of claudication or TIA. His lipids and blood pressure are well controlled. His diabetes is still uncontrolled with the HbA1c 8.4%, although signficantly improved from 10% since establishing with an endocrinologist. He underwent sleep study and was found to have severe obstructive sleep apnea and has been compliant with CPAP. No significant change in weight since his last visit. He presents here for follow up due to evidence of fluid volume overload and tachycardia on pacemaker transmissions.  Given abnormal pacemaker transmission, he was scheduled for nuclear stress test to evaluate for progression of CAD and presents today for follow up.     Problem List/Past Medical (April Rinaldo Ratel; 11/19/2015 3:25 PM) Atherosclerosis of native coronary artery of native heart without angina pectoris (I25.10)  Coronary angiography 07/08/2008: Proximal LAD 40-50%, small left circumflex 60-70% ostial disease. Large RCA occluded in the proximal segment, 3.0 x 28 mm and 3.5 x 28 mm Promus DES placed. LVEF 45%. Echocardiogram 04/18/2014: 1. Poor echo window. Left ventricle cavity is normal in size. Mild concentric hypertrophy of the left ventricle. Mild decrease in global wall motion. Doppler evidence of grade II (pseudonormal) diastolic dysfunction. Left ventricle regional wall motion findings: No wall motion abnormalities. Visual EF is 50-55%. Calculated EF 42%. 2. Left atrial cavity is mildly dilated. 3. Trace mitral regurgitation. Trace tricuspid regurgitation. No  evidence of pulmonary hypertension. Compared to Echocardiogram 11/15/2012: Moderate to severe LV systolic dysfunction, EF 35%. No significant valvular abnormalities. Exercise Myoview stress test 04/28/2013: Patient exercised for 5 minutes and 15 seconds. No chest pain. Normal blood pressure response. No evidence of ischemia by EKG. Achieved 7.0 mets. Perfusion imaging study demonstrated scar in the left circumflex coronary artery distribution with mild superimposed ischemia. Ejection fraction 29%. 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 EP study 07/14/2011: Patient had inducible VT, AICD placed. Severe obstructive sleep apnea (G47.33) 06/24/2014 Follows Dr Huston Foley  Allergies (April Rinaldo Ratel; 11/19/2015 3:25 PM) Penicillins  Rash. Sulfa Drugs  Swelling, Rash.  Family History (April Rinaldo Ratel; 11/19/2015 3:25 PM) Mother  In good health. Father  In stable health. Had Heart Transplant at age 15. Siblings  only child  Social History (April Rinaldo Ratel; 11/19/2015 3:25 PM) Current tobacco use  Former smoker. quit in 2010 Alcohol Use  Occasional alcohol use. minimal Marital status  Married. Number of Children  1 step daughter Living Situation  Lives with spouse.  Past Surgical History (April Rinaldo Ratel; 11/19/2015 3:25 PM) eye surgery  Left. 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 (Week 1: 1 pill in the morning Week 2: 1 pill in the morning, 1 in the evening Week 3: 2 pills in the morning, 1 in the evening Week 4: 2 pills in the morning, 2 in the evening) Carvedilol (25MG  Tablet, 1 (one and a half) Tablet Oral two times daily, Taken starting 10/23/2015) Active. Losartan Potassium (50MG  Tablet, 1 Oral daily, Taken starting 12/2013) Active. Clopidogrel Bisulfate (75MG  Tablet, 1 Oral daily, Taken starting 07/2008) Active. Crestor (40MG  Tablet, 1 Oral daily, Taken starting 07/2008) Active. FLUoxetine HCl (40MG  Capsule, 1 Oral daily, Taken starting 2005) Active. Prevacid (15MG  Capsule DR, 1 Oral daily, Taken starting 2005) Active. HumaLOG KwikPen (100UNIT/ML Soln Pen-inj, Subcutaneous three times daily) Active. (36 units at breakfast and lunch; 40 at dinner) Aspirin (325MG  Tablet, 1 Oral daily) Active. Oxymetazoline HCl (0.025% Solution, 2 sprays ea nostril Nasal two times daily) Active. MetFORMIN HCl ER (OSM) (500MG  Tablet ER 24HR, 2 Oral at bedtime) Active. (1 in morning) Levemir FlexPen (100UNIT/ML Solution, 120 units Subcutaneous at bedtime) Active. Lasix (40MG  Tablet, 1 Oral daily) Active. Vitamin D (1000UNIT Tablet, 1 Oral daily) Active. Multivitamin Adult (1 Oral daily) Active. Medications Reconciled (verbally)  Diagnostic Studies History (April Rinaldo RatelGarrison; 11/19/2015 3:27 PM) Echocardiogram 11/06/2015 Left ventricle cavity is normal in size. Mild concentric hypertrophy of the left ventricle. Mild decrease in global wall motion. Doppler evidence of grade II (pseudonormal) diastolic dysfunction. Study suggests elevated LA/LVEDP. Poor echo window. Wall motion abnormality has reduced sensitivity. Inferior wall appears moderately hypokinetic. LVEF 50-55% Left atrial cavity is moderate to severely dilated at 4.9 cm. Compared to the study done on  04/18/2014, no significant change noted. Nuclear stress test 10/31/2015 1. Resting EKG demonstrated normal sinus rhythm, inferior infarct old, anterior infarct old. Stress EKG is negative for myocardial ischemia. Patient exercised on Bruce protocol for 5 minutes and achieved a workload of 5.67 mets. Stress terminated due to dyspnea and achieving 94% of MPHR(THR > 85% MPHR). There were occasional PVCs in recovery. Hypertensive blood pressure response, peak blood pressure 200/90 mmHg. 2. The perfusion imaging study reveals markedly dilated left ventricle in both rest and stress images, 253 mL. There is a very large-sized severe defect in the inferior, inferolateral, inferoapical wall consistent with scar with moderate peri-infarct ischemia especially involving the lateral wall. There is a moderate-sized anterior wall scar extending from the base towards the apex with very mild peri-infarct ischemia especially towards the apical anterior wall. Left ventricular systolic function calculated by QGS was 31% with marked global hypokinesis and inferior akinesis. This is a high risk study, consider further cardiac work-up. Labwork  06/09/2015: TC 124, triglycerides 109, HDL 32, LDL 70, HbA1c 8.4%, TSH 2.79, Vitamin D 21, glucose 254, creatinine 1.0, CMP normal 11/20/2014: HbA1c 8.5% 12/17/2013: Total cholesterol 122, triglycerides 189, HDL 37, LDL 69, creatinine 0.9, potassium 4.4, CMP normal 12/03/2013: HbA1c 10.1%, urine microalbumin 264.7 Coronary Angiogram 07/08/2008 Coronary angiography: Proximal LAD 40-50%, small left circumflex 60-30% ostial disease. Large RCA occluded in the proximal segment. 3.0 x 28 mm and 3.5 x 28 mm Promus DES placed. LVEF 45%. [facility] [place] Sleep Study 06/24/2011 Sleep Study- Dr Huston FoleySaima Athar 06/24/2014: Severe OSA start CPAP EP study 07/14/2011 Patient had inducible VT, AICD placed.    Review of [facility](Bridgette Revonda Standardllison, [place];  11/21/2015 7:39 AM) General Present- Obesity. Not Present- Anorexia, Fatigue and Fever. Respiratory Not Present- Cough, Decreased Exercise Tolerance, Difficulty Breathing on  Exertion and Dyspnea. Cardiovascular Not Present- Chest Pain, Claudications, Edema, Orthopnea, Palpitations and Paroxysmal Nocturnal Dyspnea. Gastrointestinal Not Present- Black, Tarry Stool, Change in Bowel Habits and Nausea. Neurological Not Present- Focal Neurological Symptoms and Syncope. Endocrine Not Present- Cold Intolerance, Excessive Sweating, Heat Intolerance and Thyroid Problems. Hematology Not Present- Anemia, Easy Bruising, Petechiae and Prolonged Bleeding.  Vitals (April Garrison; 11/19/2015 3:39 PM) 11/19/2015 3:27 PM Weight: 314 lb Height: 71in Body Surface Area: 2.55 m Body Mass Index: 43.79 kg/m  Pulse: 88 (Regular)  P.OX: 98% (Room air) BP: 112/68 (Sitting, Left Arm, Standard)       Physical Exam (Bridgette Revonda Standardllison, AGNP-C; 11/21/2015 7:39 AM) General Mental Status-Alert. General Appearance-Cooperative, Appears stated age, Not in acute distress. Orientation-Oriented X3. Build & Nutrition-Well built and Morbidly obese.  Head and Neck Thyroid Gland Characteristics - no palpable nodules, no palpable enlargement.  Chest and Lung Exam Palpation Tender - No chest wall tenderness. Auscultation Breath sounds - Clear.  Cardiovascular Inspection Jugular vein - Right - No Distention. Auscultation Heart Sounds - S1 WNL, S2 WNL and No gallop present. Murmurs & Other Heart Sounds - Murmur - No murmur.  Abdomen Palpation/Percussion Normal exam - Non Tender and No hepatosplenomegaly. Auscultation Normal exam - Bowel sounds normal.  Peripheral Vascular Lower Extremity Inspection - Left - No Pigmentation, No Varicose veins. Right - No Pigmentation, No Varicose veins. Palpation - Edema - Left - No edema. Right - No edema. Femoral pulse - Left - Normal. Right - Normal. Popliteal  pulse - Left - Normal. Right - Normal. Dorsalis pedis pulse - Left - Normal. Right - Normal. Posterior tibial pulse - Bilateral - Note: unable to palpate. Carotid arteries - Left-No Carotid bruit. Carotid arteries - Right-No Carotid bruit. Abdomen-No prominent abdominal aortic pulsation, No epigastric bruit.  Neurologic Motor-Grossly intact without any focal deficits.  Musculoskeletal Global Assessment Left Lower Extremity - normal range of motion without pain. Right Lower Extremity - normal range of motion without pain.    Assessment & Plan (Bridgette Revonda Standardllison AGNP-C; 11/21/2015 7:38 AM) Atherosclerosis of native coronary artery of native heart without angina pectoris (I25.10) Story: Coronary angiography 07/08/2008: Proximal LAD 40-50%, small left circumflex 60-70% ostial disease. Large RCA occluded in the proximal segment, 3.0 x 28 mm and 3.5 x 28 mm Promus DES placed. LVEF 45%.  Echocardiogram 11/06/2015: Left ventricle cavity is normal in size. Mild concentric hypertrophy of the left ventricle. Mild decrease in global wall motion. Doppler evidence of grade II (pseudonormal) diastolic dysfunction. Study suggests elevated LA/LVEDP.  Poor echo window. Wall motion abnormality has reduced sensitivity. Inferior wall appears moderately hypokinetic. LVEF 50-55% Left atrial cavity is moderate to severely dilated at 4.9 cm. Compared to the study done on 04/18/2014, no significant change noted.  Exercise sestamibi stress test 10/31/2015: 1. Resting EKG demonstrated normal sinus rhythm, inferior infarct old, anterior infarct old.  Stress EKG is negative for myocardial ischemia.  Patient exercised on Bruce protocol for 5 minutes and achieved a workload of 5.67 mets.  Stress terminated due to dyspnea and achieving 94% of MPHR(THR > 85% MPHR).  There were occasional PVCs in recovery.  Hypertensive blood pressure response, peak blood pressure 200/90 mmHg. 2. The perfusion imaging study reveals  markedly dilated left ventricle in both rest and stress images, 253 mL.  There is a very large-sized severe defect in the inferior, inferolateral, inferoapical wall consistent with scar with moderate peri-infarct ischemia especially involving the lateral wall.  There is a moderate-sized anterior wall scar extending from the  base towards the apex with very mild peri-infarct ischemia especially towards the apical anterior wall.  Left ventricular systolic function calculated by QGS was 31% with marked global hypokinesis and inferior akinesis. This is a high risk study, consider further cardiac work-up.  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  EP study 07/14/2011: Patient had inducible VT, AICD placed. Impression: Remote ICD transmission 10/01/2015: AP 7%. VP < 1%. No therapy. Normal function. No high rates. CoreVue: No CHF.  Impedance monitoring for CHF via remote pacemaker/ICD 10/28/2015: No further NSVT since October 20, 2015. CHF improving to baseline.  Inperson ICD check 11/19/2015: Normal ICD function. No sustainted VT/VF. No therapy. No CHF. ERI 4.5 years. Normal thresholds. Not pacer dependant. NSVT episodes as previouly noted. No high atrial rates. No CHF.  Episodic ICD interogation for battery recall 03/13/2015: SVT @ 170/min on 03/01/2015 for 3 minutes. Current Plans Check of automatic implantable cardioverter/defibrillator (AICD) (interrogation only) (29562) Uncontrolled type 2 diabetes mellitus without complication, with long-term current use of insulin (E11.65)   Current Plans Mechanism of underlying disease process and action of medications discussed with the patient. I discussed primary/secondary prevention and also  dietary counseling was done. He presents for follow-up nuclear stress test performed due to tachycardia and evidence of fluid volume overload on pacemaker transmission. Stress test high risk revealing markedly dilated LV, and a very large-sized severe defect in the inferior, inferolateral, inferoapical wall consistent with scar with moderate peri-infarct ischemia especially involving the lateral wall, as well as a moderate-sized anterior wall scar extending from the base towards the apex with very mild peri-infarct ischemia especially towards the apical anterior wall. Given significant risk factors for progression of CAD and abnormal nuclear stress test, will schedule for left heart catheterization for further evaluation of coronary anatomy. We discussed regarding risks, benefits, alternatives to this including CTA and continued medical therapy. Patient wants to proceed. Understands <1-2% risk of death, stroke, MI, urgent CABG, bleeding, infection, renal failure but not limited to these. Follow up after cath for reevaluation and further recommendations.  *I have discussed this case with Dr. Jacinto Halim and he participated in formulating the plan.*    Signed by Brett Drake, AGNP-C (11/21/2015 7:40 AM)

## 2015-11-28 ENCOUNTER — Ambulatory Visit (HOSPITAL_COMMUNITY)
Admission: RE | Admit: 2015-11-28 | Discharge: 2015-11-28 | Disposition: A | Discharge status: Home / Self Care | Payer: Managed Care, Other (non HMO) | Source: Ambulatory Visit | Attending: Cardiology | Admitting: Cardiology

## 2015-11-28 ENCOUNTER — Encounter (HOSPITAL_COMMUNITY)
Admission: RE | Disposition: A | Discharge status: Home / Self Care | Payer: Self-pay | Source: Ambulatory Visit | Attending: Cardiology

## 2015-11-28 DIAGNOSIS — I255 Ischemic cardiomyopathy: Secondary | ICD-10-CM | POA: Diagnosis not present

## 2015-11-28 DIAGNOSIS — Z794 Long term (current) use of insulin: Secondary | ICD-10-CM | POA: Diagnosis not present

## 2015-11-28 DIAGNOSIS — Z6841 Body Mass Index (BMI) 40.0 and over, adult: Secondary | ICD-10-CM | POA: Insufficient documentation

## 2015-11-28 DIAGNOSIS — G4733 Obstructive sleep apnea (adult) (pediatric): Secondary | ICD-10-CM | POA: Diagnosis not present

## 2015-11-28 DIAGNOSIS — Z87891 Personal history of nicotine dependence: Secondary | ICD-10-CM | POA: Diagnosis not present

## 2015-11-28 DIAGNOSIS — I4729 Other ventricular tachycardia: Secondary | ICD-10-CM

## 2015-11-28 DIAGNOSIS — I252 Old myocardial infarction: Secondary | ICD-10-CM | POA: Diagnosis not present

## 2015-11-28 DIAGNOSIS — Z79899 Other long term (current) drug therapy: Secondary | ICD-10-CM | POA: Insufficient documentation

## 2015-11-28 DIAGNOSIS — E785 Hyperlipidemia, unspecified: Secondary | ICD-10-CM | POA: Insufficient documentation

## 2015-11-28 DIAGNOSIS — I10 Essential (primary) hypertension: Secondary | ICD-10-CM | POA: Diagnosis not present

## 2015-11-28 DIAGNOSIS — I2510 Atherosclerotic heart disease of native coronary artery without angina pectoris: Secondary | ICD-10-CM | POA: Diagnosis present

## 2015-11-28 DIAGNOSIS — I472 Ventricular tachycardia: Secondary | ICD-10-CM

## 2015-11-28 DIAGNOSIS — E1165 Type 2 diabetes mellitus with hyperglycemia: Secondary | ICD-10-CM | POA: Diagnosis not present

## 2015-11-28 DIAGNOSIS — F329 Major depressive disorder, single episode, unspecified: Secondary | ICD-10-CM | POA: Diagnosis not present

## 2015-11-28 DIAGNOSIS — Z8249 Family history of ischemic heart disease and other diseases of the circulatory system: Secondary | ICD-10-CM | POA: Diagnosis not present

## 2015-11-28 DIAGNOSIS — Z9013 Acquired absence of bilateral breasts and nipples: Secondary | ICD-10-CM | POA: Diagnosis not present

## 2015-11-28 DIAGNOSIS — Z950 Presence of cardiac pacemaker: Secondary | ICD-10-CM | POA: Diagnosis not present

## 2015-11-28 HISTORY — PX: CARDIAC CATHETERIZATION: SHX172

## 2015-11-28 LAB — GLUCOSE, CAPILLARY
GLUCOSE-CAPILLARY: 315 mg/dL — AB (ref 65–99)
Glucose-Capillary: 265 mg/dL — ABNORMAL HIGH (ref 65–99)

## 2015-11-28 LAB — POCT ACTIVATED CLOTTING TIME
ACTIVATED CLOTTING TIME: 191 s
ACTIVATED CLOTTING TIME: 208 s

## 2015-11-28 SURGERY — LEFT HEART CATH AND CORONARY ANGIOGRAPHY

## 2015-11-28 MED ORDER — HYDROMORPHONE HCL 1 MG/ML IJ SOLN
INTRAMUSCULAR | Status: DC | PRN
  Administered 2015-11-28 (×2): 0.5 mg via INTRAVENOUS

## 2015-11-28 MED ORDER — NITROGLYCERIN 1 MG/10 ML FOR IR/CATH LAB
INTRA_ARTERIAL | Status: AC
Start: 2015-11-28 — End: 2015-11-28
  Filled 2015-11-28: qty 10

## 2015-11-28 MED ORDER — HEPARIN SODIUM (PORCINE) 1000 UNIT/ML IJ SOLN
INTRAMUSCULAR | Status: AC
  Filled 2015-11-28: qty 1

## 2015-11-28 MED ORDER — SODIUM CHLORIDE 0.9 % IV SOLN: 250.0000 mL | INTRAVENOUS | Status: DC | PRN

## 2015-11-28 MED ORDER — SODIUM CHLORIDE 0.9 % WEIGHT BASED INFUSION: 1.0000 mL/kg/h | INTRAVENOUS | Status: DC

## 2015-11-28 MED ORDER — IOPAMIDOL (ISOVUE-370) INJECTION 76%
INTRAVENOUS | Status: AC
  Filled 2015-11-28: qty 50

## 2015-11-28 MED ORDER — ADENOSINE 12 MG/4ML IV SOLN
INTRAVENOUS | Status: AC
  Filled 2015-11-28: qty 16

## 2015-11-28 MED ORDER — LIDOCAINE HCL (PF) 1 % IJ SOLN
INTRAMUSCULAR | Status: AC
  Filled 2015-11-28: qty 30

## 2015-11-28 MED ORDER — METFORMIN HCL 500 MG PO TABS: 500.0000 mg | ORAL_TABLET | Freq: Two times a day (BID) | ORAL | Status: DC

## 2015-11-28 MED ORDER — IOPAMIDOL (ISOVUE-370) INJECTION 76%
INTRAVENOUS | Status: DC | PRN
  Administered 2015-11-28: 105 mL via INTRA_ARTERIAL

## 2015-11-28 MED ORDER — HYDROMORPHONE HCL 1 MG/ML IJ SOLN
INTRAMUSCULAR | Status: AC
  Filled 2015-11-28: qty 1

## 2015-11-28 MED ORDER — SODIUM CHLORIDE 0.9% FLUSH: 3.0000 mL | Freq: Two times a day (BID) | INTRAVENOUS | Status: DC

## 2015-11-28 MED ORDER — IOPAMIDOL (ISOVUE-370) INJECTION 76%
INTRAVENOUS | Status: AC
  Filled 2015-11-28: qty 100

## 2015-11-28 MED ORDER — MIDAZOLAM HCL 2 MG/2ML IJ SOLN
INTRAMUSCULAR | Status: AC
  Filled 2015-11-28: qty 2

## 2015-11-28 MED ORDER — LIDOCAINE HCL (PF) 1 % IJ SOLN
INTRAMUSCULAR | Status: DC | PRN
  Administered 2015-11-28: 4 mL

## 2015-11-28 MED ORDER — ASPIRIN 81 MG PO CHEW: 81.0000 mg | CHEWABLE_TABLET | ORAL | Status: DC

## 2015-11-28 MED ORDER — HEPARIN (PORCINE) IN NACL 2-0.9 UNIT/ML-% IJ SOLN
INTRAMUSCULAR | Status: AC
  Filled 2015-11-28: qty 1000

## 2015-11-28 MED ORDER — VERAPAMIL HCL 2.5 MG/ML IV SOLN
INTRAVENOUS | Status: AC
  Filled 2015-11-28: qty 2

## 2015-11-28 MED ORDER — SODIUM CHLORIDE 0.9 % WEIGHT BASED INFUSION
3.0000 mL/kg/h | INTRAVENOUS | Status: DC
  Administered 2015-11-28: 3 mL/kg/h via INTRAVENOUS

## 2015-11-28 MED ORDER — HEPARIN (PORCINE) IN NACL 2-0.9 UNIT/ML-% IJ SOLN
INTRAMUSCULAR | Status: DC | PRN
  Administered 2015-11-28: 1000 mL

## 2015-11-28 MED ORDER — HEPARIN SODIUM (PORCINE) 1000 UNIT/ML IJ SOLN
INTRAMUSCULAR | Status: DC | PRN
  Administered 2015-11-28: 5000 [IU] via INTRAVENOUS
  Administered 2015-11-28: 3000 [IU] via INTRAVENOUS

## 2015-11-28 MED ORDER — SODIUM CHLORIDE 0.9% FLUSH: 3.0000 mL | INTRAVENOUS | Status: DC | PRN

## 2015-11-28 MED ORDER — VERAPAMIL HCL 2.5 MG/ML IV SOLN
INTRA_ARTERIAL | Status: DC | PRN
  Administered 2015-11-28: 7.5 mL via INTRA_ARTERIAL

## 2015-11-28 MED ORDER — MIDAZOLAM HCL 2 MG/2ML IJ SOLN
INTRAMUSCULAR | Status: DC | PRN
  Administered 2015-11-28: 2 mg via INTRAVENOUS

## 2015-11-28 MED ORDER — ADENOSINE (DIAGNOSTIC) 140MCG/KG/MIN
INTRAVENOUS | Status: DC | PRN
  Administered 2015-11-28: 140 ug/kg/min via INTRAVENOUS

## 2015-11-28 SURGICAL SUPPLY — 11 items
CATH OPTITORQUE TIG 4.0 5F (CATHETERS) ×2 IMPLANT
CATH VISTA GUIDE 6FR XBLAD4 (CATHETERS) ×2 IMPLANT
DEVICE RAD COMP TR BAND LRG (VASCULAR PRODUCTS) ×2 IMPLANT
GLIDESHEATH SLEND A-KIT 6F 20G (SHEATH) ×2 IMPLANT
GUIDEWIRE PRESSURE COMET II (WIRE) ×2 IMPLANT
KIT ESSENTIALS PG (KITS) ×2 IMPLANT
KIT HEART LEFT (KITS) ×2 IMPLANT
PACK CARDIAC CATHETERIZATION (CUSTOM PROCEDURE TRAY) ×2 IMPLANT
TRANSDUCER W/STOPCOCK (MISCELLANEOUS) ×2 IMPLANT
TUBING CIL FLEX 10 FLL-RA (TUBING) ×2 IMPLANT
WIRE SAFE-T 1.5MM-J .035X260CM (WIRE) ×2 IMPLANT

## 2015-11-28 NOTE — Progress Notes (Signed)
Pt transferred over from cath lab at 1540  [facility].  Pt alert and oriented.  Denied any pain.  Right radial site has TRB applied with 11 cc.

## 2015-11-28 NOTE — Interval H&P Note (Signed)
History and Physical Interval Note:  11/28/2015 2:24 PM  Brett Drake  has presented today for surgery, with the diagnosis of abnormal stress test  The various methods of treatment have been discussed with the patient and family. After consideration of risks, benefits and other options for treatment, the patient has consented to  Procedure(s): Left Heart Cath and Coronary Angiography (N/A) and possible PCI as a surgical intervention .  The patient's history has been reviewed, patient examined, no change in status, stable for surgery.  I have reviewed the patient's chart and labs.  Questions were answered to the patient's satisfaction.   Ischemic Symptoms? CCS II (Slight limitation of ordinary activity) Anti-ischemic Medical Therapy? Maximal Medical Therapy (2 or more classes of medications) Non-invasive Test Results? High-risk stress test findings: cardiac mortality >3%/yr Prior CABG? No Previous CABG   Patient Information:   1-2V CAD, no prox LAD  A (8)  Indication: 19; Score: 8   Patient Information:   CTO of 1 vessel, no other CAD  A (7)  Indication: 29; Score: 7   Patient Information:   1V CAD with prox LAD  A (9)  Indication: 35; Score: 9   Patient Information:   2V-CAD with prox LAD  A (9)  Indication: 41; Score: 9   Patient Information:   3V-CAD without LMCA  A (9)  Indication: 47; Score: 9   Patient Information:   3V-CAD without LMCA With Abnormal LV systolic function  A (9)  Indication: 48; Score: 9   Patient Information:   LMCA-CAD  A (9)  Indication: 49; Score: 9   Patient Information:   2V-CAD with prox LAD PCI  A (7)  Indication: 62; Score: 7   Patient Information:   2V-CAD with prox LAD CABG  A (8)  Indication: 62; Score: 8   Patient Information:   3V-CAD without LMCA With Low CAD burden(i.e., 3 focal stenoses, low SYNTAX score) PCI  A (7)  Indication: 63; Score: 7   Patient Information:   3V-CAD without  LMCA With Low CAD burden(i.e., 3 focal stenoses, low SYNTAX score) CABG  A (9)  Indication: 63; Score: 9   Patient Information:   3V-CAD without LMCA E06c - Intermediate-high CAD burden (i.e., multiple diffuse lesions, presence of CTO, or high SYNTAX score) PCI  U (4)  Indication: 64; Score: 4   Patient Information:   3V-CAD without LMCA E06c - Intermediate-high CAD burden (i.e., multiple diffuse lesions, presence of CTO, or high SYNTAX score) CABG  A (9)  Indication: 64; Score: 9   Patient Information:   LMCA-CAD With Isolated LMCA stenosis  PCI  U (6)  Indication: 65; Score: 6   Patient Information:   LMCA-CAD With Isolated LMCA stenosis  CABG  A (9)  Indication: 65; Score: 9   Patient Information:   LMCA-CAD Additional CAD, low CAD burden (i.e., 1- to 2-vessel additional involvement, low SYNTAX score) PCI  U (5)  Indication: 66; Score: 5   Patient Information:   LMCA-CAD Additional CAD, low CAD burden (i.e., 1- to 2-vessel additional involvement, low SYNTAX score) CABG  A (9)  Indication: 66; Score: 9   Patient Information:   LMCA-CAD Additional CAD, intermediate-high CAD burden (i.e., 3-vessel involvement, presence of CTO, or high SYNTAX score) PCI  I (3)  Indication: 67; Score: 3   Patient Information:   LMCA-CAD Additional CAD, intermediate-high CAD burden (i.e., 3-vessel involvement, presence of CTO, or high SYNTAX score) CABG  A (9)  Indication: 67; Score: 9  [name]

## 2015-11-28 NOTE — Discharge Instructions (Signed)
Radial Site Care °Refer to this sheet in the next few weeks. These instructions provide you with information about caring for yourself after your procedure. Your health care provider may also give you more specific instructions. Your treatment has been planned according to current medical practices, but problems sometimes occur. Call your health care provider if you have any problems or questions after your procedure. °WHAT TO EXPECT AFTER THE PROCEDURE °After your procedure, it is typical to have the following: °· Bruising at the radial site that usually fades within 1-2 weeks. °· Blood collecting in the tissue (hematoma) that may be painful to the touch. It should usually decrease in size and tenderness within 1-2 weeks. °HOME CARE INSTRUCTIONS °· Take medicines only as directed by your health care provider. °· You may shower 24-48 hours after the procedure or as directed by your health care provider. Remove the bandage (dressing) and gently wash the site with plain soap and water. Pat the area dry with a clean towel. Do not rub the site, because this may cause bleeding. °· Do not take baths, swim, or use a hot tub until your health care provider approves. °· Check your insertion site every day for redness, swelling, or drainage. °· Do not apply powder or lotion to the site. °· Do not flex or bend the affected arm for 24 hours or as directed by your health care provider. °· Do not push or pull heavy objects with the affected arm for 24 hours or as directed by your health care provider. °· Do not lift over 10 lb (4.5 kg) for 5 days after your procedure or as directed by your health care provider. °· Ask your health care provider when it is okay to: °¨ Return to work or school. °¨ Resume usual physical activities or sports. °¨ Resume sexual activity. °· Do not drive home if you are discharged the same day as the procedure. Have someone else drive you. °· You may drive 24 hours after the procedure unless otherwise  instructed by your health care provider. °· Do not operate machinery or power tools for 24 hours after the procedure. °· If your procedure was done as an outpatient procedure, which means that you went home the same day as your procedure, a responsible adult should be with you for the first 24 hours after you arrive home. °· Keep all follow-up visits as directed by your health care provider. This is important. °SEEK MEDICAL CARE IF: °· You have a fever. °· You have chills. °· You have increased bleeding from the radial site. Hold pressure on the site. °SEEK IMMEDIATE MEDICAL CARE IF: °· You have unusual pain at the radial site. °· You have redness, warmth, or swelling at the radial site. °· You have drainage (other than a small amount of blood on the dressing) from the radial site. °· The radial site is bleeding, and the bleeding does not stop after 30 minutes of holding steady pressure on the site. °· Your arm or hand becomes pale, cool, tingly, or numb. °  °This information is not intended to replace advice given to you by your health care provider. Make sure you discuss any questions you have with your health care provider. °  °Document Released: 04/24/2010 Document Revised: 04/12/2014 Document Reviewed: 10/08/2013 °Elsevier Interactive Patient Education ©2016 [employer] ° °

## 2015-12-01 ENCOUNTER — Encounter (HOSPITAL_COMMUNITY): Payer: Self-pay | Admitting: Cardiology

## 2015-12-04 ENCOUNTER — Encounter: Payer: Self-pay | Admitting: Neurology

## 2015-12-04 ENCOUNTER — Ambulatory Visit (INDEPENDENT_AMBULATORY_CARE_PROVIDER_SITE_OTHER): Payer: Managed Care, Other (non HMO) | Admitting: Adult Health

## 2015-12-04 VITALS — BP 112/71 | HR 80 | Resp 20 | Ht 69.0 in | Wt 312.0 lb

## 2015-12-04 DIAGNOSIS — Z9989 Dependence on other enabling machines and devices: Principal | ICD-10-CM

## 2015-12-04 DIAGNOSIS — G4733 Obstructive sleep apnea (adult) (pediatric): Secondary | ICD-10-CM | POA: Diagnosis not present

## 2015-12-04 NOTE — Progress Notes (Addendum)
PATIENT: Fausto Sampedro DOB: 1970-06-19  REASON FOR VISIT: follow up- shirt to sleep apnea on CPAP HISTORY FROM: patient  HISTORY OF PRESENT ILLNESS: Mr. Loflin is a 45 year old male with a history of obstructive sleep apnea on CPAP. He returns today for a compliance download. His download indicates that he uses machine 30 out of 30 days for compliance of 100%. He uses his machine greater than 4 hours 29/30 days for compliance of 97%. On average he uses his machine 6 hours and 46 minutes. His residual AHI is 3.0 on 11 cm water with EPR of 3. Overall the patient feels that he is doing well. He does question potentially changing to a full face mask due to allergies. However he does have a beard and therefore he may not be able to obtain a good seal with the mask. Patient states that he has been diagnosed with several heart blockages. He has his first consult in a couple of weeks. The plan is for cardiac bypass. He denies any new neurological symptoms. He returns today for an evaluation.  HISTORY 12/04/14: Mr. Staten is a 45 year old right-handed gentleman with an underlying medical history of type 2 diabetes, chronic combined systolic and diastolic heart failure, ischemic cardiomyopathy, morbid obesity, and coronary artery disease, status post MI in 2010 at age 33, status post pacemaker/defibrillator placement in 2013 and s/p cardiac stent placements, who presents for follow-up consultation of his obstructive sleep apnea, on treatment with CPAP. The patient is unaccompanied today. I last saw him on 08/29/2014, at which time we talked about the sleep test results and his compliance data with CPAP therapy. He reported that he had some difficulty adjusting to CPAP therapy but he was getting better. He had used CPAP fairly consistently in the previous 2 weeks and preferred the nasal mask. He felt better rested. He felt that he had more daytime energy and overall was quite pleased with how he was  doing. He was able to lose some weight. He was scheduled for a colonoscopy soon. His hemoglobin A1c had come down from 11 to 8.8. I encouraged him to be fully compliant with treatment. I commended him for trying and the fact that he had overall done better than in the past when he was tried on CPAP.  Today, 12/04/2014: I reviewed his CPAP compliance data from 11/03/2014 through 12/02/2014 which is a total of 30 days during which time he used his machine every night with percent used days greater than 4 hours at 90%, indicating excellent compliance with an average usage of 5 hours and 47 minutes, residual AHI at 3.4 per hour, leak low with the 95th percentile at 4.7 L/m on a pressure of 11 cm with EPR of 3.  Today, 12/04/2014: He reports doing better. He is compliant with treatment. He has done much better with sleeping with the mask on. He feels better rested. He is still struggling with his weight. Diabetes numbers have improved. Unfortunately, his insurance did not cover a recent trial of a new diabetes medication. His cardiologist placed him on Lasix. He is scheduled to see a new endocrinologist in December.  Previously:  I first met him on 05/06/2014 at the request of his cardiologist, at which time he reported a prior diagnosis of OSA but intolerance to CPAP in the past. He reported weight gain. He had also had an abnormal overnight pulse oximetry test through his cardiologist's which I reviewed at the time. I invited him back for sleep study. He  had a split-night sleep study on 06/24/2014 and went over his test results with him in detail today. His baseline sleep efficiency of was reduced at 65.4% with a latency to sleep of 16.5 minutes and wake after sleep onset of 26 minutes with moderate sleep fragmentation noted. He had absence of slow-wave sleep and absence of REM sleep during the baseline portion of the study. He had frequent PVCs and PACs on EKG. He had moderate snoring. Total AHI was highly  elevated at 100.6 per hour. Average oxygen saturation was 90%, nadir was 70%. He was therefore titrated on CPAP during the later portion of the study. His arousal index improved. He achieved slow-wave sleep and REM sleep. Average oxygen saturations improved to 94%, nadir was 85%. He was titrated on CPAP from 5 cm to 10 cm of water pressure, AHI was reduced to 5.9 events per hour at the final pressure with supine REM sleep achieved. Based on the test results I prescribed CPAP therapy for home use at a pressure of 11 cm due to residual sleep disordered breathing noted on the final pressure of 10 cm.  I reviewed his CPAP compliance data from 07/29/2014 through 08/27/2014 which is a total of 30 days during which time he used his machine 27 days with percent used days rated and 4 hours at 47%, indicating suboptimal compliance with an average usage for all nights of 3 hours and 24 minutes only. Residual AHI good at 2.6 per hour with leak low at 2.2 L/m for the 95th percentile and a pressure of 11 cm with EPR of 2. It does look like he has increased his CPAP usage in the last 2 weeks.   He has a history of snoring and reports daytime somnolence. He had an overnight pulse oximetry test on 04/22/2014: Total test time was 6 hours and 37 minutes, average oxygen saturation 91.3%, lowest oxygen saturation 50%, time below 88% saturation was 85.4 minutes.  He moved here from [place]. He tells me that he was actually diagnosed with obstructive sleep apnea with a sleep study over a year ago when he was still residing in [place]. He does not have the actual test results. He was tried on CPAP at night. He had trouble tolerating it but would be willing to come back for diagnosis and treatment with another sleep study. He estimates that his sleep study was well over a year ago. He has gained a lot of weight in the last year because of increase in his insulin dose. He has had trouble with diabetes control. He's had diabetes for  about 14 years but thankfully does not endorse any complications from diabetes. He has not seen an ophthalmologist in over a year. He had a recent echocardiogram which he reports showed an EF of 50%. His echocardiogram from August 2014 showed an EF of 35%. He does not endorse any chest pain or shortness of breath at this time. He endorses loud snoring, gasping sensations while asleep, witnessed apneic pauses and frequent morning headaches.  Her typical bedtime is reported to be around 8 to 9 PM and usual wake time is around 4:30 AM. Sleep onset typically occurs within minutes. He reports feeling poorly rested upon awakening. He wakes up on an average 3 to 4 times in the middle of the night and has to go to the bathroom 3 to 4 times on a typical night. He admits to frequent morning headaches. He has to be at work at 5 AM. He works as  an Sales promotion account executive at [employer]  He reports excessive daytime somnolence (EDS) and His Epworth Sleepiness Score (ESS) is 18/24 today. He has fallen asleep while driving, in the past, on longer distances. He knows to stop and pull over if he feels sleepy at the wheel. He definitely falls asleep when he is a passenger. He does not take any scheduled. He suspects that his father has sleep apnea but he has not been formally tested. He has a family history of heart disease in his father had heart transplant.  He drinks unsweet tea maybe a glass a day. He drinks alcohol maybe at the most once per month. He quit smoking on 07/07/2008 when he had his heart attack. He had 3 coronary stents placed on 07/08/2008. He denies cataplexy, sleep paralysis, hypnagogic or hypnopompic hallucinations, or sleep attacks. He does not report any vivid dreams, nightmares, dream enactments, or parasomnias, such as sleep walking but does report some sleep talking.   His wife and 4 yo stepdaughter are still in [place]. He does not have a TV in his bedroom  REVIEW OF SYSTEMS: Out of a  complete 14 system review of symptoms, the patient complains only of the following symptoms, and all other reviewed systems are negative.  See history of present illness  ALLERGIES: Allergies  Allergen Reactions  . Penicillins Rash  . Sulfa Antibiotics Swelling and Rash    HOME MEDICATIONS: Outpatient Medications Prior to Visit  Medication Sig Dispense Refill  . aspirin 325 MG tablet Take 325 mg by mouth daily.    . carvedilol (COREG) 12.5 MG tablet Take 27.5 mg by mouth 2 (two) times daily with a meal.     . Cholecalciferol (VITAMIN D PO) Take 1 tablet by mouth daily.    Marland Kitchen CLOPIDOGREL BISULFATE PO Take 75 mg by mouth daily.    Marland Kitchen FLUoxetine (PROZAC) 40 MG capsule Take 40 mg by mouth daily.    . furosemide (LASIX) 40 MG tablet Take 40 mg by mouth daily.   11  . HUMALOG KWIKPEN 200 UNIT/ML SOPN Inject 32-40 Units into the skin daily. Take 32 units with breakfast & lunch Take 40 units with dinner    . insulin detemir (LEVEMIR) 100 UNIT/ML injection Inject 140 Units into the skin at bedtime.    . lansoprazole (PREVACID) 15 MG capsule Take 15 mg by mouth daily at 12 noon.    Marland Kitchen losartan (COZAAR) 50 MG tablet Take 50 mg by mouth daily.    . metFORMIN (GLUCOPHAGE) 500 MG tablet Take 1-2 tablets (500-1,000 mg total) by mouth 2 (two) times daily with a meal. Take 500 mg every morning  Take 1000 mg at bedtime    . Multiple Vitamin (MULTI-VITAMIN PO) Take 1 tablet by mouth daily.    Marland Kitchen oxymetazoline (AFRIN) 0.05 % nasal spray Place into the nose. As needed    . rosuvastatin (CRESTOR) 40 MG tablet Take 40 mg by mouth daily.     No facility-administered medications prior to visit.     PAST MEDICAL HISTORY: Past Medical History:  Diagnosis Date  . Anxiety   . Atherosclerosis   . Cardiac defibrillator in place   . Depression   . Diabetes mellitus without complication ([place])    type ll,uncontrolled with renal complications  . Heart failure (HCC)    chronic combined systolic and diastolic  .  Hyperlipemia   . Hypertension    essential  . Ischemic cardiomyopathy   . Morbid obesity ([place])  PAST SURGICAL HISTORY: Past Surgical History:  Procedure Laterality Date  . CARDIAC CATHETERIZATION N/A 11/28/2015   Procedure: Left Heart Cath and Coronary Angiography;  Surgeon: [name], MD;  Location: [facility];  Service: Cardiovascular;  Laterality: N/A;  . CARDIAC CATHETERIZATION N/A 11/28/2015   Procedure: Intravascular Pressure Wire/FFR Study;  Surgeon: [name], MD;  Location: [facility];  Service: Cardiovascular;  Laterality: N/A;  . EYE SURGERY Left    age 40,to correct lazy eye  . MASTECTOMY Bilateral 1985   gynecomastia  . PACEMAKER INSERTION  2013   St Jude,implantable defibrillator  . stents  07/08/2008   3     FAMILY HISTORY: Family History  Problem Relation Age of Onset  . Heart Problems Father     transplant at age 47  . Kidney failure Father   . Dementia Father     SOCIAL HISTORY: Social History   Social History  . Marital status: Married    Spouse name: Judson Roch  . Number of children: 1  . Years of education: college   Occupational History  . 1    Social History Main Topics  . Smoking status: Former Research scientist (life sciences)  . Smokeless tobacco: Never Used     Comment: quit in 2010  . Alcohol use No  . Drug use: No  . Sexual activity: Not on file   Other Topics Concern  . Not on file   Social History Narrative  . No narrative on file      PHYSICAL EXAM  Vitals:   12/04/15 1627  BP: 112/71  Pulse: 80  Resp: 20  Weight: (!) 312 lb (141.5 kg)  Height: _0  (1.753 m)   Body mass index is 46.07 kg/m.  Generalized: Well developed, in no acute distress  Neck: Circumference 19 inches  Neurological examination  Mentation: Alert oriented to time, place, history taking. Follows all commands speech and language fluent Cranial nerve II-XII: Pupils were equal round reactive to light. Extraocular movements were full, visual field were  full on confrontational test. Facial sensation and strength were normal. Uvula tongue midline. Head turning and shoulder shrug  were normal and symmetric. Motor: The motor testing reveals 5 over 5 strength of all 4 extremities. Good symmetric motor tone is noted throughout.  Sensory: Sensory testing is intact to soft touch on all 4 extremities. No evidence of extinction is noted.  Coordination: Cerebellar testing reveals good finger-nose-finger and heel-to-shin bilaterally.  Gait and station: Gait is normal. Reflexes: Deep tendon reflexes are symmetric and normal bilaterally.   DIAGNOSTIC DATA (LABS, IMAGING, TESTING) - I reviewed patient records, labs, notes, testing and imaging myself where available.     ASSESSMENT AND PLAN 45 y.o. year old male  has a past medical history of Anxiety; Atherosclerosis; Cardiac defibrillator in place; Depression; Diabetes mellitus without complication ([place]); Heart failure ([place]); Hyperlipemia; Hypertension; Ischemic cardiomyopathy; and Morbid obesity ([place]). here with:  1. Obstructive sleep apnea on CPAP  Overall the patient has excellent compliance. His apnea has reduced to 3 events per hour. The patient is considering a full face mask. I stated if he wanted to try this we could fit him for the mask in our sleep lab however his beard may cause an interference. He verbalized understanding. He states that he would call if he decides to try a new mask. Otherwise he will follow-up in one year with Dr. Carmelia Bake, MSN, NP-C 12/04/2015, 4:34 PM [facility] [phone]  [address], [place], [place] [address] [phone]  I reviewed the above note and documentation by the Nurse Practitioner and agree with the history, physical exam, assessment and plan as outlined above. I was immediately available for face-to-face consultation. Star Age, MD, PhD [facility])

## 2015-12-04 NOTE — Patient Instructions (Signed)
Overall doing well.  Continue using CPAP nightly. If you want to try full face mask- please call If your symptoms worsen or you develop new symptoms please let us know.

## 2015-12-10 ENCOUNTER — Institutional Professional Consult (permissible substitution) (INDEPENDENT_AMBULATORY_CARE_PROVIDER_SITE_OTHER): Payer: Managed Care, Other (non HMO) | Admitting: Surgery

## 2015-12-10 ENCOUNTER — Encounter: Payer: Self-pay | Admitting: Surgery

## 2015-12-10 VITALS — BP 138/86 | HR 84 | Resp 20 | Ht 69.0 in | Wt 312.0 lb

## 2015-12-10 DIAGNOSIS — I2510 Atherosclerotic heart disease of native coronary artery without angina pectoris: Secondary | ICD-10-CM

## 2015-12-11 ENCOUNTER — Other Ambulatory Visit: Payer: Self-pay | Admitting: *Deleted

## 2015-12-11 DIAGNOSIS — I2510 Atherosclerotic heart disease of native coronary artery without angina pectoris: Secondary | ICD-10-CM

## 2015-12-12 ENCOUNTER — Encounter: Payer: Self-pay | Admitting: Surgery

## 2015-12-12 NOTE — Progress Notes (Signed)
Cardiothoracic Surgery Consultation  PCP is [name], [name], NP Referring Provider is [name], MD  Chief Complaint  Patient presents with  . Coronary Artery Disease    Surgical eval, Cardiac Cath 11/28/15, ECHO 11/06/15,    HPI:  The patient is a morbidly obese 45 year old poorly controlled diabetic with hypertension and hyperlipidemia, severe OSA on CPAP,  a long history of coronary artery disease s/p PCI and DES x 2 to an occluded RCA 07/2008 after presenting in [place] with an acute IMI. He had an ICD placed in 2013 in [place] for ischemic cardiomyopathy with an EF of 35% and inducible VT. He has not had any chest pain, shortness of breath or orthopnea/PND but was noted on pacemaker transmission to have volume overload and tachycardia. He had a nuclear stress which was felt to be a high risk study with a very large severe defect in the inferior, inferolateral, inferoapical walls consistent with scar with moderate peri-infarct ischemia especially involving the lateral wall. There was also a moderate-sized anterior wall scar from the base towards the apex with very mild peri-infarct ischemia. LVEF was 31% with marked global hypokinesis and inferior akinesis. Cath on 11/28/2015 showed a dilated LV with an LVEF of 35%. The RCA was occluded proximally with faint collaterals from the left to the PDA. The LAD had 70-80% proximal stenosis with an FFR of 0.77. The LCX has a moderate sized Ramus with 90% ostial stenosis. The remainder of the LCX is small and diffusely diseased. Right heart cath not done. His last echo on 11/06/2015 showed an EF of 50-55% with normal LV cavity size and a mild decrease in global wall motion. There was trace MR.   Past Medical History:  Diagnosis Date  . Anxiety   . Atherosclerosis   . Cardiac defibrillator in place   . Depression   . 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 Heart failure (HCC)    chronic  combined systolic and diastolic  . Hyperlipemia   . Hypertension    essential  . Ischemic cardiomyopathy   . Morbid obesity (HCC)     Past Surgical History:  Procedure Laterality Date  . CARDIAC CATHETERIZATION N/A 11/28/2015   Procedure: Left Heart Cath and Coronary Angiography;  Surgeon: [name], MD;  Location: [facility] INVASIVE CV LAB;  Service: Cardiovascular;  Laterality: N/A;  . CARDIAC CATHETERIZATION N/A 11/28/2015   Procedure: Intravascular Pressure Wire/FFR Study;  Surgeon: [name], MD;  Location: [facility] INVASIVE CV LAB;  Service: Cardiovascular;  Laterality: N/A;  . EYE SURGERY Left    age 69,to correct lazy eye  . MASTECTOMY Bilateral 1985   gynecomastia  . PACEMAKER INSERTION  2013   St Jude,implantable defibrillator  . stents  07/08/2008   3     Family History  Problem Relation Age of Onset  . Heart Problems Father     transplant at age 59  . Kidney failure Father   . Dementia Father     Social History Social History  Substance Use Topics  . Smoking status: Former Games developer  . Smokeless tobacco: Never Used     Comment: quit in 2010  . Alcohol use No    Current Outpatient Prescriptions  Medication Sig Dispense Refill  . aspirin 325 MG tablet Take 325 mg by mouth daily.    . carvedilol (COREG) 12.5 MG tablet Take 27.5 mg by mouth 2 (two) times daily with a meal.     .  Cholecalciferol (VITAMIN D PO) Take 1 tablet by mouth daily.    Marland Kitchen CLOPIDOGREL BISULFATE PO Take 75 mg by mouth daily.    Marland Kitchen FLUoxetine (PROZAC) 40 MG capsule Take 40 mg by mouth daily.    . furosemide (LASIX) 40 MG tablet Take 40 mg by mouth daily.   11  . HUMALOG KWIKPEN 200 UNIT/ML SOPN Inject 32-40 Units into the skin daily. Take 32 units with breakfast & lunch Take 40 units with dinner    . Insulin Degludec (TRESIBA FLEXTOUCH) 200 UNIT/ML SOPN Inject 100 Units into the skin at bedtime.    . lansoprazole (PREVACID) 15 MG capsule Take 15 mg by mouth daily at 12 noon.    Marland Kitchen losartan (COZAAR) 50 MG  tablet Take 50 mg by mouth daily.    . metFORMIN (GLUCOPHAGE) 500 MG tablet Take 1-2 tablets (500-1,000 mg total) by mouth 2 (two) times daily with a meal. Take 500 mg every morning  Take 1000 mg at bedtime    . Multiple Vitamin (MULTI-VITAMIN PO) Take 1 tablet by mouth daily.    Marland Kitchen oxymetazoline (AFRIN) 0.05 % nasal spray Place into the nose. As needed    . rosuvastatin (CRESTOR) 40 MG tablet Take 40 mg by mouth daily.     No current facility-administered medications for this visit.     Allergies  Allergen Reactions  . Penicillins Rash  . Sulfa Antibiotics Swelling and Rash    Review of Systems  Constitutional: Positive for activity change and fatigue. Negative for appetite change and unexpected weight change.  Eyes: Negative.   Respiratory: Positive for apnea. Negative for shortness of breath.        Uses CPAP  Cardiovascular: Positive for palpitations. Negative for chest pain and leg swelling.  Gastrointestinal:       Reflux and heartburn  Endocrine: Negative.   Genitourinary: Negative.   Musculoskeletal: Negative.   Skin: Negative.   Allergic/Immunologic: Negative.   Neurological: Positive for headaches.  Hematological: Negative.   Psychiatric/Behavioral: The patient is nervous/anxious.        Depression    BP 138/86 (BP Location: Right Arm, Patient Position: Sitting, Cuff Size: Large)   Pulse 84   Resp 20   Ht 5\' 9"  (1.753 m)   Wt (!) 312 lb (141.5 kg)   SpO2 97% Comment: RA  BMI 46.07 kg/m  Physical Exam  Constitutional: He is oriented to person, place, and time.  Morbidly obese gentleman in no distress  HENT:  Head: Normocephalic and atraumatic.  Mouth/Throat: Oropharynx is clear and moist.  Eyes: EOM are normal. Pupils are equal, round, and reactive to light.  Neck: Normal range of motion. Neck supple. No JVD present. No thyromegaly present.  Cardiovascular: Normal rate, regular rhythm, normal heart sounds and intact distal pulses.   No murmur  heard. Pulmonary/Chest: Effort normal and breath sounds normal. No respiratory distress.  Scars over both breasts from bilateral mastectomy in past for gynecomastia.  Abdominal: Soft. Bowel sounds are normal. He exhibits no distension and no mass. There is no tenderness.  Musculoskeletal: Normal range of motion. He exhibits no edema or tenderness.  Lymphadenopathy:    He has no cervical adenopathy.  Neurological: He is alert and oriented to person, place, and time. He has normal strength. No cranial nerve deficit or sensory deficit.  Skin: Skin is warm and dry.  Psychiatric: He has a normal mood and affect.     Diagnostic Tests:  Physicians   Panel Physicians Referring Physician Case  Authorizing Physician  Yates DecampJay Ganji, MD (Primary)    Procedures   Intravascular Pressure Wire/FFR Study  Left Heart Cath and Coronary Angiography  Conclusion   Angiographic data: Left ventricle: Dilated LV, moderate global hypokinesis, inferior akinesis. EF 35%. RCA occluded in the proximal segment. Extensive collaterals from the left to the right. Prior stents evident. Left main: Mildly diseased, mild calcification. LAD mild calcification the proximal segment. There is a 70-80% stenosis in the proximal LAD, by FFR hemodynamically significant at 0.77. Mid to distal LAD has mild diffuse disease. Diagonal 1 is small with severely diffusely diseased, ostium has a 90% stenosis. Circumflex coronary artery: Moderate diffuse disease. Moderate size was selected most. Ostium has a 80-90% stenosis. OM1 is very large area of distribution however is severely diseased and subtotally occluded in the proximal and midsegment. Ramus intermediate: Small caliber however moderate area of distribution, diffusely diseased with a ostial 90% stenosis.  Recommendation: Patient needs evaluation for CABG. It is unfortunate that in spite of significant coronary artery disease, patient continues to have morbid obesity, uncontrolled  diabetes mellitus. He needs cardiac risk stratification.  Procedural Details/Technique   Technical Details Procedure performed: Left heart catheterization including hemodynamic monitoring of the left ventricle, LV gram, selective right and left coronary arteriography.  Indication: He has history of known coronary artery disease and myocardial infarction in 2010 at the age of 45 years. Other history includes uncontrolled diabetes mellitus, hypertension and hyperlipidemia. Had RCA intervention at that time during myocardial infarction. His recent coronary angiography was April 2010 revealing moderate disease in the LAD, 60-70% ostial circumflex disease, large RCA is occluded. EF was 45%. His recent ICD interrogation and revealed recurrent episodes of nonsustained ventricular tachycardia. Given this he underwent repeat stress testing on 10/31/2015 revealing inferolateral scar with moderate peri-infarct ischemia especially involving the lateral wall, anterior wall scar with mild peri-infarct ischemia and EF was 31%. Echocardiogram on 11/06/2015 revealed LVEF 50-55%, poor echo window with decreased wall motion sensitivity. Due to abnormal stress test is now brought to the cardiac catheter position laterality coronary anatomy.  Technique: Under sterile precautions using a 6 French right radial arterial access, a 6 French sheath was introduced into the right radial artery. A 5 [place] Tig 4 catheter was advanced into the ascending aorta selective right coronary artery and left coronary artery was cannulated and angiography was performed in multiple views. The catheter was pulled back Out of the body over exchange length J-wire. Same Catheter was used to perform LV gram which was performed in RAO projection. Catheter exchanged out of the body over J-Wire. NO immediate complications noted. Patient tolerated the procedure well.   Conscious sedation protocol was followed, I personally administered conscious sedation  and monitored the patient. Patient received 2 milligrams of Versed and 1 milligram Dilaudid . Patient tolerated the procedure well and there was no complication from conscious sedation. Time administered was 60 minutes.   Estimated blood loss <50 mL. . During this procedure the patient was administered the following to achieve and maintain moderate conscious sedation: Versed 2 mg, Dilaudid 1 mg, while the patient's heart rate, blood pressure, and oxygen saturation were continuously monitored. The period of conscious sedation was 60 minutes, of which I was present face-to-face 100% of this time.    Coronary Findings   Dominance: Right  Left Anterior Descending  Ost LAD lesion, 75% stenosed. The lesion is discrete.  Mid LAD lesion, 40% stenosed.  First Diagonal Branch  Vessel is small in size.  Ost 1st  Diag to 1st Diag lesion, 40% stenosed.  Second Diagonal [employer] 2nd Diag to 2nd Diag lesion, 40% stenosed.  Ramus Intermedius  Ost Ramus to Ramus lesion, 95% stenosed.  Left Circumflex  Vessel is moderate in size.  Ost Cx to Prox Cx lesion, 90% stenosed.  First Obtuse Marginal Branch  Vessel is small in size.  Ost 1st Mrg to 1st Mrg lesion, 99% stenosed.  Right Coronary Artery  Prox RCA to Dist RCA lesion, 100% stenosed. The lesion was previously treated using a stent (unknown type) over 2 years ago.  Right Posterior Descending Artery  RPDA filled by collaterals from 2nd Sept.  Inferior Septal  Inf Sept filled by collaterals from 3rd Sept.  Left Heart   Left Ventricle The left ventricle is moderately dilated. There is moderate to severe left ventricular systolic dysfunction. The left ventricular ejection fraction is 35-45% by visual estimate. Diffuse hypokinesis and inferior akinesis. Poor LV visualization. Hand contrast used. Normal LVEDP.    Coronary Diagrams   Diagnostic Diagram     Implants     No implant documentation for this case.  PACS Images   Show images for  Cardiac catheterization   Link to Procedure Log   Procedure Log    Hemo Data   Flowsheet Row Most Recent Value  AO Systolic Pressure 127 mmHg  AO Diastolic Pressure 82 mmHg  AO Mean 102 mmHg  LV Systolic Pressure 123 mmHg  LV Diastolic Pressure 8 mmHg  LV EDP 21 mmHg  Arterial Occlusion Pressure Extended Systolic Pressure 114 mmHg  Arterial Occlusion Pressure Extended Diastolic Pressure 73 mmHg  Arterial Occlusion Pressure Extended Mean Pressure 91 mmHg  Left Ventricular Apex Extended Systolic Pressure 123 mmHg  Left Ventricular Apex Extended Diastolic Pressure 10 mmHg  Left Ventricular Apex Extended EDP Pressure 17 mmHg    Impression:  This 45 year old morbidly obese, poorly controlled diabetic has severe multi-vessel coronary artery disease and ischemic cardiomyopathy with a high risk nuclear stress test. His EF by echo 11/06/2015 was recorded at 50-55% but it looked more like 35-40% by cath. I have personally reviewed and interpreted his cath films. He has a significant proximal LAD stenosis and the distal vessel appears graftable. The Ramus has a tight ostial stenosis and is a moderate-sized graftable vessel. The remainder of the LCX is small caliber and not graftable and at risk for ongoing ischemia. The RCA is occluded proximally and the PDA fills faintly and appears small and diffusely diseased. It may not be graftable. I think CABG is probably the best treatment for him although his long term prognosis is going to be poor if he does not get his multiple risk factors under good control. His operative risk is increased due to his morbid obesity, poorly controlled DM, and LV dysfunction. I discussed the operative procedure with the patient and his wife including alternatives, benefits and risks; including but not limited to bleeding, blood transfusion, infection, stroke, myocardial infarction, graft failure, heart block requiring a permanent pacemaker, organ dysfunction, and death.   Brett Drake understands and agrees to proceed.  We will schedule carotid dopplers and PFT's for further evaluation prior to surgery.  Plan:  CABG on Friday 12/19/2015   I spent 60 minutes performing this new patient evaluation and > 50% of this time was spent face to face counseling and coordinating the care of this patient's severe multi-vessel coronary disease.   Brett Borne, MD [facility] [phone]

## 2015-12-16 NOTE — Pre-Procedure Instructions (Signed)
Samnang Shugars  12/16/2015     [facility] SERVICE - [place], [place] - [address] [facility] [address] [place] [address] [place] [address] Phone: [phone] Fax: [phone]  Karin Golden at [facility] [address] [place] - [address] [facility] [address] [place] [address] Phone: [phone] Fax: [phone]    Your procedure is scheduled on Friday, September 15.  Report to [facility] Admitting at 5:30 AM               Your surgery or procedure is scheduled for 7:30 AM.   Call this number if you have problems the morning of surgery:[phone]   Remember:  Do not eat food or drink liquids after midnight Thursday, September 14.  Take these medicines the morning of surgery with A SIP OF WATER:carvedilol (COREG), FLUoxetine (PROZAC), rosuvastatin (CRESTOR).                DO NOT TAKE Metformin the Morning of Surgery.               How to Manage Your Diabetes Before and After Surgery  Why is it important to control my blood sugar before and after surgery? . Improving blood sugar levels before and after surgery helps healing and can limit problems. . A way of improving blood sugar control is eating a healthy diet by: o  Eating less sugar and carbohydrates o  Increasing activity/exercise o  Talking with your doctor about reaching your blood sugar goals . High blood sugars (greater than 180 mg/dL) can raise your risk of infections and slow your recovery, so you will need to focus on controlling your diabetes during the weeks before surgery. . Make sure that the doctor who takes care of your diabetes knows about your planned surgery including the date and location.  How do I manage my blood sugar before surgery? . Check your blood sugar at least 4 times a day, starting 2 days before surgery, to make sure that the level is not too high or low. o Check your blood sugar the morning of your surgery when you wake up and every 2  hours until you get to the Short Stay unit. . If your blood sugar is less than 70 mg/dL, you will need to treat for low blood sugar: o Do not take insulin. o Treat a low blood sugar (less than 70 mg/dL) with  cup of clear juice (cranberry or apple), 4 glucose tablets, OR glucose gel. o Recheck blood sugar in 15 minutes after treatment (to make sure it is greater than 70 mg/dL). If your blood sugar is not greater than 70 mg/dL on recheck, call [phone] for further instructions. . Report your blood sugar to the short stay nurse when you get to Short Stay.  . If you are admitted to the hospital after surgery: o Your blood sugar will be checked by the staff and you will probably be given insulin after surgery (instead of oral diabetes medicines) to make sure you have good blood sugar levels. o The goal for blood sugar control after surgery is 80-180 mg/dL.     WHAT DO I DO ABOUT MY DIABETES MEDICATION?   Marland Kitchen Do not take oral diabetes medicines (pills) the morning of surgery.  . THE NIGHT BEFORE SURGERY, take ___________ units of ___________insulin.       Marland Kitchen HE MORNING OF SURGERY, take _____________ units of __________insulin.  . The day of  surgery, do not take other diabetes injectables, including Byetta (exenatide), Bydureon (exenatide ER), Victoza (liraglutide), or Trulicity (dulaglutide).  . If your CBG is greater than 220 mg/dL, you may take  of your sliding scale (correction) dose of insulin.  Other Instructions:    Patient Signature:  Date:   Nurse Signature:  Date:   Reviewed and Endorsed by [facility] Patient Education Committee, August 2015                 [facility]- [facility]  Before surgery, you can play an important role. Because skin is not sterile, your skin needs to be as free of germs as possible. You can reduce the number of germs on your skin by washing with CHG (chlorahexidine gluconate) Soap before surgery.  CHG is an antiseptic cleaner which  kills germs and bonds with the skin to continue killing germs even after washing.  Please do not use if you have an allergy to CHG or antibacterial soaps. If your skin becomes reddened/irritated stop using the CHG.  Do not shave (including legs and underarms) for at least 48 hours prior to first CHG shower. It is OK to shave your face.  Please follow these instructions carefully.   1. Shower the NIGHT BEFORE SURGERY and the MORNING OF SURGERY with CHG.   2. If you chose to wash your hair, wash your hair first as usual with your normal shampoo.  3. After you shampoo, rinse your hair and body thoroughly to remove the shampoo.  4. Use CHG as you would any other liquid soap. You can apply CHG directly to the skin and wash gently with a scrungie or a clean washcloth.   5. Apply the CHG Soap to your body ONLY FROM THE NECK DOWN.  Do not use on open wounds or open sores. Avoid contact with your eyes, ears, mouth and genitals (private parts). Wash genitals (private parts) with your normal soap.  6. Wash thoroughly, paying special attention to the area where your surgery will be performed.  7. Thoroughly rinse your body with warm water from the neck down.  8. DO NOT shower/wash with your normal soap after using and rinsing off the CHG Soap.  9. Pat yourself dry with a CLEAN TOWEL.   10. Wear CLEAN PAJAMAS   11. Place CLEAN SHEETS on your bed the night of your first shower and DO NOT SLEEP WITH PETS.  Day of Surgery: Do not apply any deodorants/lotions. Please wear clean clothes to the hospital/surgery center.                  Do not wear jewelry, make-up or nail polish.  Do not wear lotions, powders, or perfumes, or deodorant.   Men may shave face and neck.  Do not bring valuables to the hospital.  [facility] is not responsible for any belongings or valuables.  Contacts, dentures or bridgework may not be worn into surgery.  Leave your suitcase in the car.  After surgery it may be  brought to your room.  For patients admitted to the hospital, discharge time will be determined by your treatment team.  Special instructions:  -  Please read over the following fact sheets that you were given. [facility]- Preparing For Surgery and Patient Instructions for Mupirocin Application, Pain Booklet, Incentive Spirometry.

## 2015-12-17 ENCOUNTER — Ambulatory Visit (HOSPITAL_BASED_OUTPATIENT_CLINIC_OR_DEPARTMENT_OTHER)
Admission: RE | Admit: 2015-12-17 | Discharge: 2015-12-17 | Disposition: A | Discharge status: Home / Self Care | Payer: Managed Care, Other (non HMO) | Source: Ambulatory Visit | Attending: Surgery | Admitting: Surgery

## 2015-12-17 ENCOUNTER — Ambulatory Visit (HOSPITAL_COMMUNITY)
Admission: RE | Admit: 2015-12-17 | Discharge: 2015-12-17 | Disposition: A | Discharge status: Home / Self Care | Payer: Managed Care, Other (non HMO) | Source: Ambulatory Visit | Attending: Surgery | Admitting: Surgery

## 2015-12-17 ENCOUNTER — Encounter (HOSPITAL_COMMUNITY): Payer: Self-pay

## 2015-12-17 DIAGNOSIS — Z01818 Encounter for other preprocedural examination: Secondary | ICD-10-CM

## 2015-12-17 DIAGNOSIS — K219 Gastro-esophageal reflux disease without esophagitis: Secondary | ICD-10-CM | POA: Diagnosis present

## 2015-12-17 DIAGNOSIS — I110 Hypertensive heart disease with heart failure: Secondary | ICD-10-CM

## 2015-12-17 DIAGNOSIS — I509 Heart failure, unspecified: Secondary | ICD-10-CM

## 2015-12-17 DIAGNOSIS — I472 Ventricular tachycardia: Secondary | ICD-10-CM | POA: Diagnosis not present

## 2015-12-17 DIAGNOSIS — E785 Hyperlipidemia, unspecified: Secondary | ICD-10-CM | POA: Diagnosis present

## 2015-12-17 DIAGNOSIS — Z95810 Presence of automatic (implantable) cardiac defibrillator: Secondary | ICD-10-CM

## 2015-12-17 DIAGNOSIS — I255 Ischemic cardiomyopathy: Secondary | ICD-10-CM | POA: Diagnosis present

## 2015-12-17 DIAGNOSIS — Z87891 Personal history of nicotine dependence: Secondary | ICD-10-CM | POA: Insufficient documentation

## 2015-12-17 DIAGNOSIS — Z8249 Family history of ischemic heart disease and other diseases of the circulatory system: Secondary | ICD-10-CM

## 2015-12-17 DIAGNOSIS — Z79899 Other long term (current) drug therapy: Secondary | ICD-10-CM

## 2015-12-17 DIAGNOSIS — Z7982 Long term (current) use of aspirin: Secondary | ICD-10-CM

## 2015-12-17 DIAGNOSIS — K5900 Constipation, unspecified: Secondary | ICD-10-CM | POA: Diagnosis not present

## 2015-12-17 DIAGNOSIS — I252 Old myocardial infarction: Secondary | ICD-10-CM

## 2015-12-17 DIAGNOSIS — Z6841 Body Mass Index (BMI) 40.0 and over, adult: Secondary | ICD-10-CM

## 2015-12-17 DIAGNOSIS — I2510 Atherosclerotic heart disease of native coronary artery without angina pectoris: Secondary | ICD-10-CM | POA: Diagnosis not present

## 2015-12-17 DIAGNOSIS — E1121 Type 2 diabetes mellitus with diabetic nephropathy: Secondary | ICD-10-CM | POA: Diagnosis present

## 2015-12-17 DIAGNOSIS — Z01812 Encounter for preprocedural laboratory examination: Secondary | ICD-10-CM

## 2015-12-17 DIAGNOSIS — F329 Major depressive disorder, single episode, unspecified: Secondary | ICD-10-CM | POA: Diagnosis present

## 2015-12-17 DIAGNOSIS — E119 Type 2 diabetes mellitus without complications: Secondary | ICD-10-CM | POA: Insufficient documentation

## 2015-12-17 DIAGNOSIS — I5042 Chronic combined systolic (congestive) and diastolic (congestive) heart failure: Secondary | ICD-10-CM | POA: Diagnosis present

## 2015-12-17 DIAGNOSIS — Z794 Long term (current) use of insulin: Secondary | ICD-10-CM

## 2015-12-17 DIAGNOSIS — E8770 Fluid overload, unspecified: Secondary | ICD-10-CM | POA: Diagnosis not present

## 2015-12-17 DIAGNOSIS — I2582 Chronic total occlusion of coronary artery: Secondary | ICD-10-CM | POA: Diagnosis present

## 2015-12-17 DIAGNOSIS — Z880 Allergy status to penicillin: Secondary | ICD-10-CM

## 2015-12-17 DIAGNOSIS — G4733 Obstructive sleep apnea (adult) (pediatric): Secondary | ICD-10-CM | POA: Diagnosis present

## 2015-12-17 DIAGNOSIS — Z882 Allergy status to sulfonamides status: Secondary | ICD-10-CM

## 2015-12-17 DIAGNOSIS — Z7902 Long term (current) use of antithrombotics/antiplatelets: Secondary | ICD-10-CM

## 2015-12-17 DIAGNOSIS — F419 Anxiety disorder, unspecified: Secondary | ICD-10-CM | POA: Diagnosis present

## 2015-12-17 DIAGNOSIS — Z0183 Encounter for blood typing: Secondary | ICD-10-CM

## 2015-12-17 DIAGNOSIS — D62 Acute posthemorrhagic anemia: Secondary | ICD-10-CM | POA: Diagnosis not present

## 2015-12-17 DIAGNOSIS — Z955 Presence of coronary angioplasty implant and graft: Secondary | ICD-10-CM

## 2015-12-17 DIAGNOSIS — E1165 Type 2 diabetes mellitus with hyperglycemia: Secondary | ICD-10-CM | POA: Diagnosis present

## 2015-12-17 HISTORY — DX: Gastro-esophageal reflux disease without esophagitis: K21.9

## 2015-12-17 HISTORY — DX: Sleep apnea, unspecified: G47.30

## 2015-12-17 HISTORY — DX: Headache, unspecified: R51.9

## 2015-12-17 HISTORY — DX: Frequency of micturition: R35.0

## 2015-12-17 HISTORY — DX: Presence of automatic (implantable) cardiac defibrillator: Z95.810

## 2015-12-17 HISTORY — DX: Presence of spectacles and contact lenses: Z97.3

## 2015-12-17 HISTORY — DX: Personal history of pneumonia (recurrent): Z87.01

## 2015-12-17 HISTORY — DX: Headache: R51

## 2015-12-17 LAB — VAS US DOPPLER PRE CABG
LCCAPDIAS: 23 cm/s
LCCAPSYS: 123 cm/s
LEFT ECA DIAS: -17 cm/s
LEFT VERTEBRAL DIAS: -13 cm/s
LICADDIAS: -22 cm/s
Left CCA dist dias: -29 cm/s
Left CCA dist sys: -71 cm/s
Left ICA dist sys: -66 cm/s
Left ICA prox dias: -31 cm/s
Left ICA prox sys: -65 cm/s
RCCADSYS: -70 cm/s
RCCAPDIAS: 20 cm/s
RIGHT ECA DIAS: -30 cm/s
RIGHT VERTEBRAL DIAS: 17 cm/s
Right CCA prox sys: 106 cm/s

## 2015-12-17 LAB — GLUCOSE, CAPILLARY: Glucose-Capillary: 337 mg/dL — ABNORMAL HIGH (ref 65–99)

## 2015-12-17 LAB — BLOOD GAS, ARTERIAL
Acid-Base Excess: 1.7 mmol/L (ref 0.0–2.0)
Bicarbonate: 25.6 mmol/L (ref 20.0–28.0)
DRAWN BY: 449841
FIO2: 0.21
O2 SAT: 97 %
PATIENT TEMPERATURE: 98.6
pCO2 arterial: 39.4 mmHg (ref 32.0–48.0)
pH, Arterial: 7.429 (ref 7.350–7.450)
pO2, Arterial: 92.5 mmHg (ref 83.0–108.0)

## 2015-12-17 LAB — COMPREHENSIVE METABOLIC PANEL
ALT: 55 U/L (ref 17–63)
AST: 36 U/L (ref 15–41)
Albumin: 3.3 g/dL — ABNORMAL LOW (ref 3.5–5.0)
Alkaline Phosphatase: 96 U/L (ref 38–126)
Anion gap: 10 (ref 5–15)
BUN: 19 mg/dL (ref 6–20)
CHLORIDE: 105 mmol/L (ref 101–111)
CO2: 22 mmol/L (ref 22–32)
CREATININE: 0.86 mg/dL (ref 0.61–1.24)
Calcium: 9.1 mg/dL (ref 8.9–10.3)
GFR calc non Af Amer: >60 mL/min (ref >60)
Glucose, Bld: 309 mg/dL — ABNORMAL HIGH (ref 65–99)
POTASSIUM: 4.3 mmol/L (ref 3.5–5.1)
SODIUM: 137 mmol/L (ref 135–145)
Total Bilirubin: 0.8 mg/dL (ref 0.3–1.2)
Total Protein: 6.2 g/dL — ABNORMAL LOW (ref 6.5–8.1)

## 2015-12-17 LAB — PULMONARY FUNCTION TEST
DL/VA % pred: 85 %
DL/VA: 4.05 ml/min/mmHg/L
DLCO unc % pred: 59 %
DLCO unc: 20.18 ml/min/mmHg
FEF 25-75 PRE: 3.74 L/s
FEF 25-75 Post: 3.93 L/sec
FEF2575-%CHANGE-POST: 5 %
FEF2575-%Pred-Post: 103 %
FEF2575-%Pred-Pre: 98 %
FEV1-%CHANGE-POST: 0 %
FEV1-%PRED-POST: 78 %
FEV1-%PRED-PRE: 79 %
FEV1-POST: 3.3 L
FEV1-PRE: 3.32 L
FEV1FVC-%CHANGE-POST: 2 %
FEV1FVC-%Pred-Pre: 107 %
FEV6-%Change-Post: -2 %
FEV6-%PRED-POST: 73 %
FEV6-%Pred-Pre: 75 %
FEV6-PRE: 3.91 L
FEV6-Post: 3.81 L
FEV6FVC-%PRED-PRE: 103 %
FEV6FVC-%Pred-Post: 103 %
FVC-%CHANGE-POST: -2 %
FVC-%Pred-Post: 71 %
FVC-%Pred-Pre: 73 %
FVC-POST: 3.81 L
FVC-Pre: 3.91 L
POST FEV1/FVC RATIO: 87 %
PRE FEV6/FVC RATIO: 100 %
Post FEV6/FVC ratio: 100 %
Pre FEV1/FVC ratio: 85 %
RV % PRED: 77 %
RV: 1.55 L
TLC % pred: 77 %
TLC: 5.51 L

## 2015-12-17 LAB — PROTIME-INR
INR: 0.94
Prothrombin Time: 12.6 seconds (ref 11.4–15.2)

## 2015-12-17 LAB — URINALYSIS, ROUTINE W REFLEX MICROSCOPIC
BILIRUBIN URINE: NEGATIVE
HGB URINE DIPSTICK: NEGATIVE
KETONES UR: NEGATIVE mg/dL
LEUKOCYTES UA: NEGATIVE
Nitrite: NEGATIVE
PH: 6 (ref 5.0–8.0)
PROTEIN: NEGATIVE mg/dL
Specific Gravity, Urine: 1.035 — ABNORMAL HIGH (ref 1.005–1.030)

## 2015-12-17 LAB — CBC
HCT: 40.6 % (ref 39.0–52.0)
Hemoglobin: 12.8 g/dL — ABNORMAL LOW (ref 13.0–17.0)
MCH: 25.5 pg — ABNORMAL LOW (ref 26.0–34.0)
MCHC: 31.5 g/dL (ref 30.0–36.0)
MCV: 80.9 fL (ref 78.0–100.0)
PLATELETS: 215 10*3/uL (ref 150–400)
RBC: 5.02 MIL/uL (ref 4.22–5.81)
RDW: 14.8 % (ref 11.5–15.5)
WBC: 6.2 10*3/uL (ref 4.0–10.5)

## 2015-12-17 LAB — SURGICAL PCR SCREEN
MRSA, PCR: NEGATIVE
Staphylococcus aureus: NEGATIVE

## 2015-12-17 LAB — TYPE AND SCREEN
ABO/RH(D): B NEG
ANTIBODY SCREEN: NEGATIVE

## 2015-12-17 LAB — URINE MICROSCOPIC-ADD ON: RBC / HPF: NONE SEEN RBC/hpf (ref 0–5)

## 2015-12-17 LAB — ABO/RH: ABO/RH(D): B NEG

## 2015-12-17 LAB — APTT: APTT: 27 s (ref 24–36)

## 2015-12-17 MED ORDER — ALBUTEROL SULFATE (2.5 MG/3ML) 0.083% IN NEBU
2.5000 mg | INHALATION_SOLUTION | Freq: Once | RESPIRATORY_TRACT | Status: AC
  Administered 2015-12-17: 2.5 mg via RESPIRATORY_TRACT

## 2015-12-17 NOTE — Pre-Procedure Instructions (Signed)
Lenor CoffinChristopher Darnold  12/17/2015      [facility] [place], [place] - [id number] [place] [id number] Phone: [phone] Fax: [phone]  [facility] [address] - [place], [place] - [address] [place] [address] Phone: [phone] Fax: [phone]    Your procedure is scheduled on Friday, September 15th, 2017.  [facility]   Call this number if you have problems the morning of surgery:  [phone]   Remember:  Do not eat food or drink liquids after midnight.   Take these medicines the morning of surgery with A SIP OF WATER: Carvedilol (Coreg), Fluoxetine (Prozac).   WHAT DO I DO ABOUT MY DIABETES MEDICATION?  Marland Kitchen. Do not take oral diabetes medicines (pills) the morning of surgery.  Do NOT take Metformin the morning of surgery.   . The NIGHT Before Surgery, take 50 units of Tresiba.    . THE MORNING OF SURGERY, take 0 units of Humalog insulin unless blood sugar is greater than 220.  . If your CBG is greater than 220 mg/dL, you may take  of your sliding scale (correction) dose of insulin.  The morning of surgery, if blood sugar is greater than 220, you may take 1/2 of your sliding scale (16 units of Humalog).     7 days prior to surgery, stop taking: Clopidogrel (Plavix), Aleve, Naproxen, Ibuprofen, Advil, Motrin, BC's, Goody's, Fish oil, all herbal medications, and all vitamins.    How to Manage Your Diabetes Before and After Surgery  Why is it important to control my blood sugar before and after surgery? . Improving blood sugar levels before and after surgery helps healing and can limit problems. . A way of improving blood sugar control is eating a healthy diet by: o  Eating less sugar and carbohydrates o  Increasing activity/exercise o  Talking with your doctor about reaching your blood sugar goals . High blood  sugars (greater than 180 mg/dL) can raise your risk of infections and slow your recovery, so you will need to focus on controlling your diabetes during the weeks before surgery. . Make sure that the doctor who takes care of your diabetes knows about your planned surgery including the date and location.  How do I manage my blood sugar before surgery? . Check your blood sugar at least 4 times a day, starting 2 days before surgery, to make sure that the level is not too high or low. o Check your blood sugar the morning of your surgery when you wake up and every 2 hours until you get to the Short Stay unit. . If your blood sugar is less than 70 mg/dL, you will need to treat for low blood sugar: o Do not take insulin. o Treat a low blood sugar (less than 70 mg/dL) with  cup of clear juice (cranberry or apple), 4 glucose tablets, OR glucose gel. o Recheck blood sugar in 15 minutes after treatment (to make sure it is greater than 70 mg/dL). If your blood sugar is not greater than 70 mg/dL on recheck, call [phone] for further instructions. . Report your blood sugar to the short stay nurse when you get to Short Stay.  . If you are admitted to the hospital after surgery: o Your blood sugar will be checked by the staff and you will probably be given insulin after surgery (instead  of oral diabetes medicines) to make sure you have good blood sugar levels. o The goal for blood sugar control after surgery is 80-180 mg/dL.    Do not wear jewelry.  Do not wear lotions, powders, or colognes, or deoderant.  Men may shave face and neck.  Do not bring valuables to the hospital.  [facility] is not responsible for any belongings or valuables.  Contacts, dentures or bridgework may not be worn into surgery.  Leave your suitcase in the car.  After surgery it may be brought to your room.  For patients admitted to the hospital, discharge time will be determined by your treatment team.  Patients discharged the  day of surgery will not be allowed to drive home.   Special instructions:  Preparing for Surgery.  Please read over the following fact sheets that you were given. MRSA Information, Incentive Spirometry   [place]- Preparing For Surgery  Before surgery, you can play an important role. Because skin is not sterile, your skin needs to be as free of germs as possible. You can reduce the number of germs on your skin by washing with CHG (chlorahexidine gluconate) Soap before surgery.  CHG is an antiseptic cleaner which kills germs and bonds with the skin to continue killing germs even after washing.  Please do not use if you have an allergy to CHG or antibacterial soaps. If your skin becomes reddened/irritated stop using the CHG.  Do not shave (including legs and underarms) for at least 48 hours prior to first CHG shower. It is OK to shave your face.  Please follow these instructions carefully.   1. Shower the NIGHT BEFORE SURGERY and the MORNING OF SURGERY with CHG.   2. If you chose to wash your hair, wash your hair first as usual with your normal shampoo.  3. After you shampoo, rinse your hair and body thoroughly to remove the shampoo.  4. Use CHG as you would any other liquid soap. You can apply CHG directly to the skin and wash gently with a scrungie or a clean washcloth.   5. Apply the CHG Soap to your body ONLY FROM THE NECK DOWN.  Do not use on open wounds or open sores. Avoid contact with your eyes, ears, mouth and genitals (private parts). Wash genitals (private parts) with your normal soap.  6. Wash thoroughly, paying special attention to the area where your surgery will be performed.  7. Thoroughly rinse your body with warm water from the neck down.  8. DO NOT shower/wash with your normal soap after using and rinsing off the CHG Soap.  9. Pat yourself dry with a CLEAN TOWEL.   10. Wear CLEAN PAJAMAS   11. Place CLEAN SHEETS on your bed the night of your first shower and  DO NOT SLEEP WITH PETS.  Day of Surgery: Do not apply any deodorants/lotions. Please wear clean clothes to the hospital/surgery center.

## 2015-12-17 NOTE — Progress Notes (Signed)
PCP - Dr. Maxcine Hameborah [name] Cardiologist - Dr. Nadara EatonGangi Endocrinologist - Dr. Ocie CornfieldMonica Doerr  EKG - 11/26/15 CXR - 12/17/15 Echo- 11/06/2015 Stress test - 10/31/15 Cardiac Cath - 11/28/15  Patient denies chest pain and shortness of breath at PAT appointment.  Patient's blood sugar 337 on arrival to appointment and states that he did take his diabetes medications this morning and that he had a sausage and cheese croissant at 0645.  Patient informed nurse that he checks his blood sugar 3 times a day and that his fasting glucose is usually around 250 but goes down later in the day.  Darius Bumpyan Brooks, RN notified of blood sugar.  Last dose of Plavix was 12/10/15.  Per Darius Bumpyan Brooks, RN, patient is to continue 325 mg Aspirin but should not take day of surgery.  Patient made aware and verbalized understanding.    Patient has a [employer] ICD and states it was last checked at Dr. Jodi MarbleGangi's office last month.   Kerry FortBrian Small with [employer] made aware of patient's scheduled surgery and stated that he would arrive to hospital before 0700 on day of surgery.

## 2015-12-17 NOTE — Progress Notes (Signed)
Pre-op Cardiac Surgery  Carotid Findings:  No evidence of stenosis in bilateral carotid arteries.  Upper Extremity Right Left  Brachial Pressures 124 128  Radial Waveforms Triphasic Triphasic  Ulnar Waveforms Triphasic Triphasic  Palmar Arch (Allen's Test) WNL WNL   Findings:  WNL    Lower  Extremity Right Left  Dorsalis Pedis Tiphasic Triphasic  Anterior Tibial Triphasic Triphasic  Posterior Tibial Triphasic Triphasic  Ankle/Brachial Indices 1.1 1.1    Findings:  Normal ABI at rest.   Brett Drake, BS, RDMS, RVT

## 2015-12-18 LAB — HEMOGLOBIN A1C
Hgb A1c MFr Bld: 8.3 % — ABNORMAL HIGH (ref 4.8–5.6)
MEAN PLASMA GLUCOSE: 192 mg/dL

## 2015-12-18 MED ORDER — VANCOMYCIN HCL 10 G IV SOLR
1500.0000 mg | INTRAVENOUS | Status: AC
  Administered 2015-12-19: 1500 mg via INTRAVENOUS
  Filled 2015-12-18: qty 1500

## 2015-12-18 MED ORDER — SODIUM CHLORIDE 0.9 % IV SOLN
INTRAVENOUS | Status: AC
  Administered 2015-12-19: 2.4 [IU]/h via INTRAVENOUS
  Filled 2015-12-18: qty 2.5

## 2015-12-18 MED ORDER — MAGNESIUM SULFATE 50 % IJ SOLN
40.0000 meq | INTRAMUSCULAR | Status: DC
  Filled 2015-12-18: qty 10

## 2015-12-18 MED ORDER — POTASSIUM CHLORIDE 2 MEQ/ML IV SOLN
80.0000 meq | INTRAVENOUS | Status: DC
  Filled 2015-12-18: qty 40

## 2015-12-18 MED ORDER — EPINEPHRINE HCL 1 MG/ML IJ SOLN
0.0000 ug/min | INTRAVENOUS | Status: DC
  Filled 2015-12-18: qty 4

## 2015-12-18 MED ORDER — SODIUM CHLORIDE 0.9 % IV SOLN
INTRAVENOUS | Status: AC
  Administered 2015-12-19: 69.8 mL/h via INTRAVENOUS
  Filled 2015-12-18: qty 40

## 2015-12-18 MED ORDER — DEXTROSE 5 % IV SOLN
1.5000 g | INTRAVENOUS | Status: AC
  Administered 2015-12-19: 1.5 g via INTRAVENOUS
  Administered 2015-12-19: .75 g via INTRAVENOUS
  Filled 2015-12-18 (×2): qty 1.5

## 2015-12-18 MED ORDER — DEXMEDETOMIDINE HCL IN NACL 400 MCG/100ML IV SOLN
0.1000 ug/kg/h | INTRAVENOUS | Status: AC
  Administered 2015-12-19: .5 ug/kg/h via INTRAVENOUS
  Filled 2015-12-18: qty 100

## 2015-12-18 MED ORDER — PHENYLEPHRINE HCL 10 MG/ML IJ SOLN
30.0000 ug/min | INTRAMUSCULAR | Status: AC
  Administered 2015-12-19: 25 ug/min via INTRAVENOUS
  Filled 2015-12-18: qty 2

## 2015-12-18 MED ORDER — SODIUM CHLORIDE 0.9 % IV SOLN
INTRAVENOUS | Status: DC
  Filled 2015-12-18: qty 30

## 2015-12-18 MED ORDER — NITROGLYCERIN IN D5W 200-5 MCG/ML-% IV SOLN
2.0000 ug/min | INTRAVENOUS | Status: DC
  Filled 2015-12-18: qty 250

## 2015-12-18 MED ORDER — METOPROLOL TARTRATE 12.5 MG HALF TABLET: 12.5000 mg | ORAL_TABLET | Freq: Once | ORAL | Status: DC

## 2015-12-18 MED ORDER — DOPAMINE-DEXTROSE 3.2-5 MG/ML-% IV SOLN
0.0000 ug/kg/min | INTRAVENOUS | Status: AC
  Administered 2015-12-19: 3 ug/kg/min via INTRAVENOUS
  Filled 2015-12-18: qty 250

## 2015-12-18 MED ORDER — DEXTROSE 5 % IV SOLN
750.0000 mg | INTRAVENOUS | Status: DC
  Filled 2015-12-18: qty 750

## 2015-12-18 MED ORDER — CHLORHEXIDINE GLUCONATE 0.12 % MT SOLN
15.0000 mL | Freq: Once | OROMUCOSAL | Status: AC
  Administered 2015-12-19: 15 mL via OROMUCOSAL

## 2015-12-18 MED ORDER — PAPAVERINE HCL 30 MG/ML IJ SOLN
INTRAMUSCULAR | Status: AC
  Administered 2015-12-19: 500 mL
  Filled 2015-12-18: qty 2.5

## 2015-12-18 NOTE — Anesthesia Preprocedure Evaluation (Addendum)
Anesthesia Evaluation  Patient identified by MRN, date of birth, ID band Patient awake    Reviewed: Allergy & Precautions, NPO status , Patient's Chart, lab work & pertinent test results, reviewed documented beta blocker date and time   Airway Mallampati: III  TM Distance: >3 FB Neck ROM: Full    Dental  (+) Teeth Intact, Dental Advisory Given   Pulmonary sleep apnea , former smoker,    breath sounds clear to auscultation       Cardiovascular hypertension, Pt. on medications and Pt. on home beta blockers + CAD, + Past MI and +CHF  + pacemaker + Cardiac Defibrillator  Rhythm:Regular Rate:Normal     Neuro/Psych  Headaches, PSYCHIATRIC DISORDERS Anxiety Depression    GI/Hepatic Neg liver ROS, GERD  Medicated,  Endo/Other  diabetes, Type 2, Insulin Dependent, Oral Hypoglycemic Agents  Renal/GU   negative genitourinary   Musculoskeletal negative musculoskeletal ROS (+)   Abdominal   Peds negative pediatric ROS (+)  Hematology negative hematology ROS (+)   Anesthesia Other Findings   Reproductive/Obstetrics negative OB ROS                            Lab Results  Component Value Date   WBC 6.2 12/17/2015   HGB 12.8 (L) 12/17/2015   HCT 40.6 12/17/2015   MCV 80.9 12/17/2015   PLT 215 12/17/2015   Lab Results  Component Value Date   CREATININE 0.86 12/17/2015   BUN 19 12/17/2015   NA 137 12/17/2015   K 4.3 12/17/2015   CL 105 12/17/2015   CO2 22 12/17/2015   Lab Results  Component Value Date   INR 0.94 12/17/2015     Anesthesia Physical Anesthesia Plan  ASA: IV  Anesthesia Plan: General   Post-op Pain Management:    Induction: Intravenous  Airway Management Planned: Oral ETT  Additional Equipment: Arterial line, CVP, PA Cath, Ultrasound Guidance Line Placement and TEE  Intra-op Plan:   Post-operative Plan: Post-operative intubation/ventilation  Informed Consent: I  have reviewed the patients History and Physical, chart, labs and discussed the procedure including the risks, benefits and alternatives for the proposed anesthesia with the patient or authorized representative who has indicated his/her understanding and acceptance.   Dental advisory given  Plan Discussed with: CRNA  Anesthesia Plan Comments:         Anesthesia Quick Evaluation

## 2015-12-18 NOTE — Progress Notes (Signed)
Anesthesia chart review: Patient is a 45 year old male scheduled for CABG on 12/19/2015 by Dr. Laneta SimmersBartle.  History includes CAD/MI s/p DES RCA 07/08/08 [facility]), chronic combined systolic and diastolic CHF, ischemic cardiomyopathy, s/p [employer] ICD 07/14/11, hypertension, hyperlipidemia, former smoker, diabetes mellitus type 2, GERD, obstructive sleep apnea, headaches, anxiety, depression, morbid obesity, gynecomastia s/p surgery '85.  PCP is Maxcine Hameborah [name], NP. Cardiologist is Dr. Jacinto HalimGanji. He has been doing "pacer" checks.  Endocrinologist is Dr. Ocie CornfieldMonica Doerr.  Meds include aspirin 325 mg, Coreg, Plavix, Prozac, Lasix, Humalog, Tresiba, Prevacid, losartan, metformin, Afrin, Crestor. Plavix on hold since 12/10/15.   11/28/15 Cardiac cath: Angiographic data: Left ventricle: Dilated LV, moderate global hypokinesis, inferior akinesis. EF 35%. RCA occluded in the proximal segment. Extensive collaterals from the left to the right. Prior stents evident. Left main: Mildly diseased, mild calcification. LAD mild calcification the proximal segment. There is a 70-80% stenosis in the proximal LAD, by FFR hemodynamically significant at 0.77. Mid to distal LAD has mild diffuse disease. Diagonal 1 is small with severely diffusely diseased, ostium has a 90% stenosis. Circumflex coronary artery: Moderate diffuse disease. Moderate size was selected most. Ostium has a 80-90% stenosis. OM1 is very large area of distribution however is severely diseased and subtotally occluded in the proximal and midsegment. Ramus intermediate: Small caliber however moderate area of distribution, diffusely diseased with a ostial 90% stenosis. Recommendation: Patient needs evaluation for CABG. It is unfortunate that in spite of significant coronary artery disease, patient continues to have morbid obesity, uncontrolled diabetes mellitus. He needs cardiac risk stratification.  11/06/15 Echo [facility]): Conclusion:  1. Left  ventricle cavity is normal in size. Mild concentric hypertrophy of the left ventricle. Mild decrease in global wall motion. Doppler evidence of grade 2 diastolic dysfunction. Study suggest elevated LA/LVEDP. Poor echo window. Wall motion abnormality has reduce sensitivity. Inferior wall appears moderately hypokinetic. LVEF 50-55%. 2. Left atrial cavity is moderate to severely dilated at 4.9 cm. 3. Compared to the study done on 04/18/2014, no significant change noted.  12/17/15 Carotid U/S: Carotid Duplex Evaluation: No evidence of significant stenosis in bilateral carotid arteries.  12/17/15 PFTS: FVC 3.91 (73%), FEV1 3.32 (79%), DLCOunc 20.18 (59%).   Preoperative EKG [facility]), chest x-ray, labs noted. Non-fasting glucose 309 (called to TCTS RN Ryan by PAT RN). A1c 8.3 consistent with mean plasma glucose of 192. He will get a fasting CBG on arrival.  [facility] (faxed this morning). PAT RN already contacted Kerry FortBrian Small with [employer].  Further evaluation by his anesthesiologist and surgeon on arrival tomorrow.  Velna Ochsllison Abeer Deskins, PA-C [facility] Phone [phone] 12/18/2015 11:45 AM

## 2015-12-18 NOTE — H&P (Signed)
[address]       [place] [address]             [phone]      Cardiothoracic Surgery History and Physical   PCP is [name], [name], NP Referring Provider is [name], MD      Chief Complaint  Patient presents with  . Coronary Artery Disease        HPI:  The patient is a morbidly obese 45 year old poorly controlled diabetic with hypertension and hyperlipidemia, severe OSA on CPAP,  a long history of coronary artery disease s/p PCI and DES x 2 to an occluded RCA 07/2008 after presenting in [place] with an acute IMI. He had an ICD placed in 2013 in [place] for ischemic cardiomyopathy with an EF of 35% and inducible VT. He has not had any chest pain, shortness of breath or orthopnea/PND but was noted on pacemaker transmission to have volume overload and tachycardia. He had a nuclear stress which was felt to be a high risk study with a very large severe defect in the inferior, inferolateral, inferoapical walls consistent with scar with moderate peri-infarct ischemia especially involving the lateral wall. There was also a moderate-sized anterior wall scar from the base towards the apex with very mild peri-infarct ischemia. LVEF was 31% with marked global hypokinesis and inferior akinesis. Cath on 11/28/2015 showed a dilated LV with an LVEF of 35%. The RCA was occluded proximally with faint collaterals from the left to the PDA. The LAD had 70-80% proximal stenosis with an FFR of 0.77. The LCX has a moderate sized Ramus with 90% ostial stenosis. The remainder of the LCX is small and diffusely diseased. Right heart cath not done. His last echo on 11/06/2015 showed an EF of 50-55% with normal LV cavity size and a mild decrease in global wall motion. There was trace MR.       Past Medical History:  Diagnosis Date  . Anxiety   . Atherosclerosis   . Cardiac defibrillator in place   . Depression   . 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 Heart failure (HCC)    chronic combined systolic and diastolic  . Hyperlipemia   . Hypertension    essential  . Ischemic cardiomyopathy   . Morbid obesity (HCC)          Past Surgical History:  Procedure Laterality Date  . CARDIAC CATHETERIZATION N/A 11/28/2015   Procedure: Left Heart Cath and Coronary Angiography;  Surgeon: [name], MD;  Location: [facility] INVASIVE CV LAB;  Service: Cardiovascular;  Laterality: N/A;  . CARDIAC CATHETERIZATION N/A 11/28/2015   Procedure: Intravascular Pressure Wire/FFR Study;  Surgeon: [name], MD;  Location: [facility] INVASIVE CV LAB;  Service: Cardiovascular;  Laterality: N/A;  . EYE SURGERY Left    age 84,to correct lazy eye  . MASTECTOMY Bilateral 1985   gynecomastia  . PACEMAKER INSERTION  2013   St Jude,implantable defibrillator  . stents  07/08/2008   3           Family History  Problem Relation Age of Onset  . Heart Problems Father     transplant at age 70  . Kidney failure Father   . Dementia Father     Social History       Social History  Substance Use Topics  . Smoking status: Former Games developer  . Smokeless tobacco: Never Used  Comment: quit in 2010  . Alcohol use No          Current Outpatient Prescriptions  Medication Sig Dispense Refill  . aspirin 325 MG tablet Take 325 mg by mouth daily.    . carvedilol (COREG) 12.5 MG tablet Take 27.5 mg by mouth 2 (two) times daily with a meal.     . Cholecalciferol (VITAMIN D PO) Take 1 tablet by mouth daily.    Marland Kitchen. CLOPIDOGREL BISULFATE PO Take 75 mg by mouth daily.    Marland Kitchen. FLUoxetine (PROZAC) 40 MG capsule Take 40 mg by mouth daily.    . furosemide (LASIX) 40 MG tablet Take 40 mg by mouth daily.   11  . HUMALOG KWIKPEN 200 UNIT/ML SOPN Inject 32-40 Units into the skin daily. Take 32 units with breakfast & lunch Take 40 units with dinner    . Insulin Degludec (TRESIBA FLEXTOUCH) 200 UNIT/ML SOPN Inject  100 Units into the skin at bedtime.    . lansoprazole (PREVACID) 15 MG capsule Take 15 mg by mouth daily at 12 noon.    Marland Kitchen. losartan (COZAAR) 50 MG tablet Take 50 mg by mouth daily.    . metFORMIN (GLUCOPHAGE) 500 MG tablet Take 1-2 tablets (500-1,000 mg total) by mouth 2 (two) times daily with a meal. Take 500 mg every morning  Take 1000 mg at bedtime    . Multiple Vitamin (MULTI-VITAMIN PO) Take 1 tablet by mouth daily.    Marland Kitchen. oxymetazoline (AFRIN) 0.05 % nasal spray Place into the nose. As needed    . rosuvastatin (CRESTOR) 40 MG tablet Take 40 mg by mouth daily.     No current facility-administered medications for this visit.         Allergies  Allergen Reactions  . Penicillins Rash  . Sulfa Antibiotics Swelling and Rash    Review of Systems  Constitutional: Positive for activity change and fatigue. Negative for appetite change and unexpected weight change.  Eyes: Negative.   Respiratory: Positive for apnea. Negative for shortness of breath.        Uses CPAP  Cardiovascular: Positive for palpitations. Negative for chest pain and leg swelling.  Gastrointestinal:       Reflux and heartburn  Endocrine: Negative.   Genitourinary: Negative.   Musculoskeletal: Negative.   Skin: Negative.   Allergic/Immunologic: Negative.   Neurological: Positive for headaches.  Hematological: Negative.   Psychiatric/Behavioral: The patient is nervous/anxious.        Depression    BP 138/86 (BP Location: Right Arm, Patient Position: Sitting, Cuff Size: Large)   Pulse 84   Resp 20   Ht 5\' 9"  (1.753 m)   Wt (!) 312 lb (141.5 kg)   SpO2 97% Comment: RA  BMI 46.07 kg/m  Physical Exam  Constitutional: He is oriented to person, place, and time.  Morbidly obese gentleman in no distress  HENT:  Head: Normocephalic and atraumatic.  Mouth/Throat: Oropharynx is clear and moist.  Eyes: EOM are normal. Pupils are equal, round, and reactive to light.  Neck: Normal range of motion.  Neck supple. No JVD present. No thyromegaly present.  Cardiovascular: Normal rate, regular rhythm, normal heart sounds and intact distal pulses.   No murmur heard. Pulmonary/Chest: Effort normal and breath sounds normal. No respiratory distress.  Scars over both breasts from bilateral mastectomy in past for gynecomastia.  Abdominal: Soft. Bowel sounds are normal. He exhibits no distension and no mass. There is no tenderness.  Musculoskeletal: Normal range of motion. He exhibits  no edema or tenderness.  Lymphadenopathy:    He has no cervical adenopathy.  Neurological: He is alert and oriented to person, place, and time. He has normal strength. No cranial nerve deficit or sensory deficit.  Skin: Skin is warm and dry.  Psychiatric: He has a normal mood and affect.     Diagnostic Tests:  Physicians   Panel Physicians Referring Physician Case Authorizing Physician  [name], MD (Primary)    Procedures   Intravascular Pressure Wire/FFR Study  Left Heart Cath and Coronary Angiography  Conclusion   Angiographic data:Left ventricle: Dilated LV, moderate global hypokinesis, inferior akinesis. EF 35%. RCA occluded in the proximal segment. Extensive collaterals from the left to the right. Prior stents evident. Left main:Mildly diseased, mild calcification. LAD mild calcification the proximal segment. There is a 70-80% stenosis in the proximal LAD, by FFR hemodynamically significant at 0.77. Mid to distal LAD has mild diffuse disease. Diagonal 1 is small with severely diffusely diseased, ostium has a 90% stenosis. Circumflex coronary artery:Moderate diffuse disease. Moderate size was selected most. Ostium has a 80-90% stenosis. OM1 is very large area of distribution however is severely diseased and subtotally occluded in the proximal and midsegment. Ramus intermediate: Small caliber however moderate area of distribution, diffusely diseased with a ostial 90%  stenosis.  Recommendation:Patient needs evaluation for CABG. It is unfortunate that in spite of significant coronary artery disease, patient continues to have morbid obesity, uncontrolled diabetes mellitus. He needs cardiac risk stratification.  Procedural Details/Technique   Technical Details Procedure performed: Left heart catheterization including hemodynamic monitoring of the left ventricle, LV gram, selective right and left coronary arteriography.  Indication: He has history of known coronary artery disease and myocardial infarction in 2010 at the age of 53 years. Other history includes uncontrolled diabetes mellitus, hypertension and hyperlipidemia. Had RCA intervention at that time during myocardial infarction. His recent coronary angiography was April 2010 revealing moderate disease in the LAD, 60-70% ostial circumflex disease, large RCA is occluded. EF was 45%. His recent ICD interrogation and revealed recurrent episodes of nonsustained ventricular tachycardia. Given this he underwent repeat stress testing on 10/31/2015 revealing inferolateral scar with moderate peri-infarct ischemia especially involving the lateral wall, anterior wall scar with mild peri-infarct ischemia and EF was 31%. Echocardiogram on 11/06/2015 revealed LVEF 50-55%, poor echo window with decreased wall motion sensitivity. Due to abnormal stress test is now brought to the cardiac catheter position laterality coronary anatomy.  Technique: Under sterile precautions using a 6 French right radial arterial access, a 6 French sheath was introduced into the right radial artery. A 5 [place] Tig 4 catheter was advanced into the ascending aorta selective right coronary artery and left coronary artery was cannulated and angiography was performed in multiple views. The catheter was pulled back Out of the body over exchange length J-wire. Same Catheter was used to perform LV gram which was performed in RAO projection. Catheter exchanged  out of the body over J-Wire. NO immediate complications noted. Patient tolerated the procedure well.   Conscious sedation protocol was followed, I personally administered conscious sedation and monitored the patient. Patient received 2 milligrams of Versed and 1 milligram Dilaudid . Patient tolerated the procedure well and there was no complication from conscious sedation. Time administered was 60 minutes.   Estimated blood loss <50 mL. . During this procedure the patient was administered the following to achieve and maintain moderate conscious sedation: Versed 2 mg, Dilaudid 1 mg, while the patient's heart rate, blood pressure, and oxygen  saturation were continuously monitored. The period of conscious sedation was 60 minutes, of which I was present face-to-face 100% of this time.    Coronary Findings   Dominance: Right  Left Anterior Descending  Ost LAD lesion, 75% stenosed. The lesion is discrete.  Mid LAD lesion, 40% stenosed.  First Diagonal Branch  Vessel is small in size.  Ost 1st Diag to 1st Diag lesion, 40% stenosed.  Second Diagonal [employer] 2nd Diag to 2nd Diag lesion, 40% stenosed.  Ramus Intermedius  Ost Ramus to Ramus lesion, 95% stenosed.  Left Circumflex  Vessel is moderate in size.  Ost Cx to Prox Cx lesion, 90% stenosed.  First Obtuse Marginal Branch  Vessel is small in size.  Ost 1st Mrg to 1st Mrg lesion, 99% stenosed.  Right Coronary Artery  Prox RCA to Dist RCA lesion, 100% stenosed. The lesion was previously treated using a stent (unknown type) over 2 years ago.  Right Posterior Descending Artery  RPDA filled by collaterals from 2nd Sept.  Inferior Septal  Inf Sept filled by collaterals from 3rd Sept.  Left Heart   Left Ventricle The left ventricle is moderately dilated. There is moderate to severe left ventricular systolic dysfunction. The left ventricular ejection fraction is 35-45% by visual estimate. Diffuse hypokinesis and inferior akinesis. Poor LV  visualization. Hand contrast used. Normal LVEDP.    Coronary Diagrams   Diagnostic Diagram     Implants        No implant documentation for this case.  PACS Images   Show images for Cardiac catheterization   Link to Procedure Log   Procedure Log    Hemo Data   Flowsheet Row Most Recent Value  AO Systolic Pressure 127 mmHg  AO Diastolic Pressure 82 mmHg  AO Mean 102 mmHg  LV Systolic Pressure 123 mmHg  LV Diastolic Pressure 8 mmHg  LV EDP 21 mmHg  Arterial Occlusion Pressure Extended Systolic Pressure 114 mmHg  Arterial Occlusion Pressure Extended Diastolic Pressure 73 mmHg  Arterial Occlusion Pressure Extended Mean Pressure 91 mmHg  Left Ventricular Apex Extended Systolic Pressure 123 mmHg  Left Ventricular Apex Extended Diastolic Pressure 10 mmHg  Left Ventricular Apex Extended EDP Pressure 17 mmHg    Impression:  This 45 year old morbidly obese, poorly controlled diabetic has severe multi-vessel coronary artery disease and ischemic cardiomyopathy with a high risk nuclear stress test. His EF by echo 11/06/2015 was recorded at 50-55% but it looked more like 35-40% by cath. I have personally reviewed and interpreted his cath films. He has a significant proximal LAD stenosis and the distal vessel appears graftable. The Ramus has a tight ostial stenosis and is a moderate-sized graftable vessel. The remainder of the LCX is small caliber and not graftable and at risk for ongoing ischemia. The RCA is occluded proximally and the PDA fills faintly and appears small and diffusely diseased. It may not be graftable. I think CABG is probably the best treatment for him although his long term prognosis is going to be poor if he does not get his multiple risk factors under good control. His operative risk is increased due to his morbid obesity, poorly controlled DM, and LV dysfunction. I discussed the operative procedure with the patient and his wife including alternatives, benefits and  risks; including but not limited to bleeding, blood transfusion, infection, stroke, myocardial infarction, graft failure, heart block requiring a permanent pacemaker, organ dysfunction, and death.  Lenor Coffin understands and agrees to proceed.  We will  schedule carotid dopplers and PFT's for further evaluation prior to surgery.  Plan:  CABG on Friday 12/19/2015   Alleen Borne, MD [facility] [phone]

## 2015-12-19 ENCOUNTER — Inpatient Hospital Stay (HOSPITAL_COMMUNITY): Payer: Managed Care, Other (non HMO)

## 2015-12-19 ENCOUNTER — Inpatient Hospital Stay (HOSPITAL_COMMUNITY): Payer: Managed Care, Other (non HMO) | Admitting: Anesthesiology

## 2015-12-19 ENCOUNTER — Inpatient Hospital Stay (HOSPITAL_COMMUNITY)
Admission: RE | Admit: 2015-12-19 | Discharge: 2015-12-24 | DRG: 236 | Disposition: A | Discharge status: Home / Self Care | Payer: Managed Care, Other (non HMO) | Source: Ambulatory Visit | Attending: Surgery | Admitting: Surgery

## 2015-12-19 ENCOUNTER — Encounter (HOSPITAL_COMMUNITY)
Admission: RE | Disposition: A | Discharge status: Home / Self Care | Payer: Self-pay | Source: Ambulatory Visit | Attending: Surgery

## 2015-12-19 DIAGNOSIS — Z01818 Encounter for other preprocedural examination: Secondary | ICD-10-CM | POA: Diagnosis not present

## 2015-12-19 DIAGNOSIS — Z0183 Encounter for blood typing: Secondary | ICD-10-CM | POA: Diagnosis not present

## 2015-12-19 DIAGNOSIS — I255 Ischemic cardiomyopathy: Secondary | ICD-10-CM | POA: Diagnosis present

## 2015-12-19 DIAGNOSIS — D62 Acute posthemorrhagic anemia: Secondary | ICD-10-CM | POA: Diagnosis not present

## 2015-12-19 DIAGNOSIS — K219 Gastro-esophageal reflux disease without esophagitis: Secondary | ICD-10-CM | POA: Diagnosis present

## 2015-12-19 DIAGNOSIS — E8770 Fluid overload, unspecified: Secondary | ICD-10-CM | POA: Diagnosis not present

## 2015-12-19 DIAGNOSIS — I25110 Atherosclerotic heart disease of native coronary artery with unstable angina pectoris: Secondary | ICD-10-CM

## 2015-12-19 DIAGNOSIS — E785 Hyperlipidemia, unspecified: Secondary | ICD-10-CM | POA: Diagnosis present

## 2015-12-19 DIAGNOSIS — Z87891 Personal history of nicotine dependence: Secondary | ICD-10-CM | POA: Diagnosis not present

## 2015-12-19 DIAGNOSIS — Z951 Presence of aortocoronary bypass graft: Secondary | ICD-10-CM

## 2015-12-19 DIAGNOSIS — E1165 Type 2 diabetes mellitus with hyperglycemia: Secondary | ICD-10-CM | POA: Diagnosis present

## 2015-12-19 DIAGNOSIS — J90 Pleural effusion, not elsewhere classified: Secondary | ICD-10-CM

## 2015-12-19 DIAGNOSIS — Z01812 Encounter for preprocedural laboratory examination: Secondary | ICD-10-CM | POA: Diagnosis not present

## 2015-12-19 DIAGNOSIS — G4733 Obstructive sleep apnea (adult) (pediatric): Secondary | ICD-10-CM | POA: Diagnosis present

## 2015-12-19 DIAGNOSIS — Z95810 Presence of automatic (implantable) cardiac defibrillator: Secondary | ICD-10-CM | POA: Diagnosis not present

## 2015-12-19 DIAGNOSIS — I2582 Chronic total occlusion of coronary artery: Secondary | ICD-10-CM | POA: Diagnosis present

## 2015-12-19 DIAGNOSIS — K5900 Constipation, unspecified: Secondary | ICD-10-CM | POA: Diagnosis not present

## 2015-12-19 DIAGNOSIS — E1121 Type 2 diabetes mellitus with diabetic nephropathy: Secondary | ICD-10-CM | POA: Diagnosis present

## 2015-12-19 DIAGNOSIS — I5042 Chronic combined systolic (congestive) and diastolic (congestive) heart failure: Secondary | ICD-10-CM | POA: Diagnosis present

## 2015-12-19 DIAGNOSIS — I2510 Atherosclerotic heart disease of native coronary artery without angina pectoris: Secondary | ICD-10-CM | POA: Diagnosis present

## 2015-12-19 DIAGNOSIS — I110 Hypertensive heart disease with heart failure: Secondary | ICD-10-CM | POA: Diagnosis present

## 2015-12-19 DIAGNOSIS — F419 Anxiety disorder, unspecified: Secondary | ICD-10-CM | POA: Diagnosis present

## 2015-12-19 DIAGNOSIS — F329 Major depressive disorder, single episode, unspecified: Secondary | ICD-10-CM | POA: Diagnosis present

## 2015-12-19 DIAGNOSIS — I472 Ventricular tachycardia: Secondary | ICD-10-CM | POA: Diagnosis not present

## 2015-12-19 DIAGNOSIS — Z6841 Body Mass Index (BMI) 40.0 and over, adult: Secondary | ICD-10-CM | POA: Diagnosis not present

## 2015-12-19 DIAGNOSIS — Z955 Presence of coronary angioplasty implant and graft: Secondary | ICD-10-CM | POA: Diagnosis not present

## 2015-12-19 HISTORY — PX: TEE WITHOUT CARDIOVERSION: SHX5443

## 2015-12-19 HISTORY — PX: CORONARY ARTERY BYPASS GRAFT: SHX141

## 2015-12-19 LAB — CBC WITH DIFFERENTIAL/PLATELET
BASOS ABS: 0.1 10*3/uL (ref 0.0–0.1)
Basophils Relative: 0 %
EOS ABS: 0 10*3/uL (ref 0.0–0.7)
EOS PCT: 0 %
HCT: 32.5 % — ABNORMAL LOW (ref 39.0–52.0)
HEMOGLOBIN: 10.1 g/dL — AB (ref 13.0–17.0)
Lymphocytes Relative: 8 %
Lymphs Abs: 1.9 10*3/uL (ref 0.7–4.0)
MCH: 25.3 pg — ABNORMAL LOW (ref 26.0–34.0)
MCHC: 31.1 g/dL (ref 30.0–36.0)
MCV: 81.3 fL (ref 78.0–100.0)
Monocytes Absolute: 1.4 10*3/uL — ABNORMAL HIGH (ref 0.1–1.0)
Monocytes Relative: 6 %
NEUTROS PCT: 86 %
Neutro Abs: 19.9 10*3/uL — ABNORMAL HIGH (ref 1.7–7.7)
PLATELETS: 263 10*3/uL (ref 150–400)
RBC: 4 MIL/uL — AB (ref 4.22–5.81)
RDW: 14.7 % (ref 11.5–15.5)
WBC: 23.3 10*3/uL — ABNORMAL HIGH (ref 4.0–10.5)

## 2015-12-19 LAB — HEMOGLOBIN AND HEMATOCRIT, BLOOD
HCT: 29.9 % — ABNORMAL LOW (ref 39.0–52.0)
Hemoglobin: 9.7 g/dL — ABNORMAL LOW (ref 13.0–17.0)

## 2015-12-19 LAB — GLUCOSE, CAPILLARY
GLUCOSE-CAPILLARY: 187 mg/dL — AB (ref 65–99)
GLUCOSE-CAPILLARY: 143 mg/dL — AB (ref 65–99)
GLUCOSE-CAPILLARY: 155 mg/dL — AB (ref 65–99)
GLUCOSE-CAPILLARY: 173 mg/dL — AB (ref 65–99)
Glucose-Capillary: 180 mg/dL — ABNORMAL HIGH (ref 65–99)
Glucose-Capillary: 174 mg/dL — ABNORMAL HIGH (ref 65–99)
Glucose-Capillary: 152 mg/dL — ABNORMAL HIGH (ref 65–99)
Glucose-Capillary: 158 mg/dL — ABNORMAL HIGH (ref 65–99)
Glucose-Capillary: 142 mg/dL — ABNORMAL HIGH (ref 65–99)
Glucose-Capillary: 182 mg/dL — ABNORMAL HIGH (ref 65–99)

## 2015-12-19 LAB — POCT I-STAT 3, ART BLOOD GAS (G3+)
ACID-BASE DEFICIT: 2 mmol/L (ref 0.0–2.0)
ACID-BASE DEFICIT: 2 mmol/L (ref 0.0–2.0)
BICARBONATE: 25.9 mmol/L (ref 20.0–28.0)
BICARBONATE: 24.8 mmol/L (ref 20.0–28.0)
Bicarbonate: 24.5 mmol/L (ref 20.0–28.0)
O2 SAT: 91 %
O2 Saturation: 98 %
O2 Saturation: 97 %
PCO2 ART: 47.8 mmHg (ref 32.0–48.0)
PH ART: 7.341 — AB (ref 7.350–7.450)
PO2 ART: 95 mmHg (ref 83.0–108.0)
Patient temperature: 36.7
Patient temperature: 36.5
TCO2: 26 mmol/L (ref 0–100)
TCO2: 27 mmol/L (ref 0–100)
TCO2: 26 mmol/L (ref 0–100)
pCO2 arterial: 46.1 mmHg (ref 32.0–48.0)
pCO2 arterial: 49.4 mmHg — ABNORMAL HIGH (ref 32.0–48.0)
pH, Arterial: 7.332 — ABNORMAL LOW (ref 7.350–7.450)
pH, Arterial: 7.306 — ABNORMAL LOW (ref 7.350–7.450)
pO2, Arterial: 65 mmHg — ABNORMAL LOW (ref 83.0–108.0)
pO2, Arterial: 103 mmHg (ref 83.0–108.0)

## 2015-12-19 LAB — APTT: aPTT: 29 seconds (ref 24–36)

## 2015-12-19 LAB — POCT I-STAT, CHEM 8
BUN: 13 mg/dL (ref 6–20)
Calcium, Ion: 1.16 mmol/L (ref 1.15–1.40)
Chloride: 103 mmol/L (ref 101–111)
Creatinine, Ser: 0.7 mg/dL (ref 0.61–1.24)
Glucose, Bld: 155 mg/dL — ABNORMAL HIGH (ref 65–99)
HEMATOCRIT: 30 % — AB (ref 39.0–52.0)
HEMOGLOBIN: 10.2 g/dL — AB (ref 13.0–17.0)
Potassium: 4.3 mmol/L (ref 3.5–5.1)
SODIUM: 140 mmol/L (ref 135–145)
TCO2: 26 mmol/L (ref 0–100)

## 2015-12-19 LAB — CBC
HEMATOCRIT: 33.2 % — AB (ref 39.0–52.0)
Hemoglobin: 10.5 g/dL — ABNORMAL LOW (ref 13.0–17.0)
MCH: 25.4 pg — ABNORMAL LOW (ref 26.0–34.0)
MCHC: 31.6 g/dL (ref 30.0–36.0)
MCV: 80.4 fL (ref 78.0–100.0)
Platelets: 240 10*3/uL (ref 150–400)
RBC: 4.13 MIL/uL — ABNORMAL LOW (ref 4.22–5.81)
RDW: 14.8 % (ref 11.5–15.5)
WBC: 24.8 10*3/uL — ABNORMAL HIGH (ref 4.0–10.5)

## 2015-12-19 LAB — PLATELET COUNT: PLATELETS: 211 10*3/uL (ref 150–400)

## 2015-12-19 LAB — POCT I-STAT 4, (NA,K, GLUC, HGB,HCT)
Glucose, Bld: 182 mg/dL — ABNORMAL HIGH (ref 65–99)
HCT: 30 % — ABNORMAL LOW (ref 39.0–52.0)
HEMOGLOBIN: 10.2 g/dL — AB (ref 13.0–17.0)
POTASSIUM: 4.1 mmol/L (ref 3.5–5.1)
Sodium: 139 mmol/L (ref 135–145)

## 2015-12-19 LAB — CREATININE, SERUM
CREATININE: 0.86 mg/dL (ref 0.61–1.24)
GFR calc Af Amer: >60 mL/min (ref >60)

## 2015-12-19 LAB — ECHO INTRAOPERATIVE TEE: WEIGHTICAEL: 5040 [oz_av]

## 2015-12-19 LAB — MAGNESIUM: MAGNESIUM: 2.6 mg/dL — AB (ref 1.7–2.4)

## 2015-12-19 LAB — PROTIME-INR
INR: 1.3
Prothrombin Time: 16.3 seconds — ABNORMAL HIGH (ref 11.4–15.2)

## 2015-12-19 SURGERY — CORONARY ARTERY BYPASS GRAFTING (CABG)
Anesthesia: General | Site: Chest

## 2015-12-19 MED ORDER — DOCUSATE SODIUM 100 MG PO CAPS
200.0000 mg | ORAL_CAPSULE | Freq: Every day | ORAL | Status: DC
  Administered 2015-12-20 – 2015-12-21 (×2): 200 mg via ORAL
  Filled 2015-12-19 (×2): qty 2

## 2015-12-19 MED ORDER — PROPOFOL 10 MG/ML IV BOLUS
INTRAVENOUS | Status: AC
  Filled 2015-12-19: qty 20

## 2015-12-19 MED ORDER — ROCURONIUM BROMIDE 10 MG/ML (PF) SYRINGE
PREFILLED_SYRINGE | INTRAVENOUS | Status: AC
  Filled 2015-12-19: qty 40

## 2015-12-19 MED ORDER — ASPIRIN EC 325 MG PO TBEC
325.0000 mg | DELAYED_RELEASE_TABLET | Freq: Every day | ORAL | Status: DC
  Administered 2015-12-20 – 2015-12-21 (×2): 325 mg via ORAL
  Filled 2015-12-19 (×2): qty 1

## 2015-12-19 MED ORDER — PHENYLEPHRINE 40 MCG/ML (10ML) SYRINGE FOR IV PUSH (FOR BLOOD PRESSURE SUPPORT)
PREFILLED_SYRINGE | INTRAVENOUS | Status: AC
  Filled 2015-12-19: qty 10

## 2015-12-19 MED ORDER — MIDAZOLAM HCL 10 MG/2ML IJ SOLN
INTRAMUSCULAR | Status: AC
  Filled 2015-12-19: qty 2

## 2015-12-19 MED ORDER — FAMOTIDINE IN NACL 20-0.9 MG/50ML-% IV SOLN
20.0000 mg | Freq: Two times a day (BID) | INTRAVENOUS | Status: AC
  Administered 2015-12-19: 20 mg via INTRAVENOUS

## 2015-12-19 MED ORDER — LACTATED RINGERS IV SOLN
INTRAVENOUS | Status: DC | PRN
  Administered 2015-12-19: 07:00:00 via INTRAVENOUS

## 2015-12-19 MED ORDER — OXYCODONE HCL 5 MG PO TABS
5.0000 mg | ORAL_TABLET | ORAL | Status: DC | PRN
  Administered 2015-12-20 (×2): 10 mg via ORAL
  Administered 2015-12-21: 5 mg via ORAL
  Administered 2015-12-21 (×3): 10 mg via ORAL
  Administered 2015-12-22 (×2): 5 mg via ORAL
  Filled 2015-12-19 (×7): qty 2

## 2015-12-19 MED ORDER — MORPHINE SULFATE (PF) 2 MG/ML IV SOLN
1.0000 mg | INTRAVENOUS | Status: AC | PRN
  Administered 2015-12-19 (×2): 2 mg via INTRAVENOUS

## 2015-12-19 MED ORDER — FENTANYL CITRATE (PF) 250 MCG/5ML IJ SOLN
INTRAMUSCULAR | Status: DC | PRN
  Administered 2015-12-19 (×3): 250 ug via INTRAVENOUS
  Administered 2015-12-19: 100 ug via INTRAVENOUS
  Administered 2015-12-19: 150 ug via INTRAVENOUS
  Administered 2015-12-19: 50 ug via INTRAVENOUS
  Administered 2015-12-19: 200 ug via INTRAVENOUS
  Administered 2015-12-19: 50 ug via INTRAVENOUS
  Administered 2015-12-19: 100 ug via INTRAVENOUS

## 2015-12-19 MED ORDER — ASPIRIN 81 MG PO CHEW: 324.0000 mg | CHEWABLE_TABLET | Freq: Every day | ORAL | Status: DC

## 2015-12-19 MED ORDER — BISACODYL 10 MG RE SUPP: 10.0000 mg | Freq: Every day | RECTAL | Status: DC

## 2015-12-19 MED ORDER — EPHEDRINE 5 MG/ML INJ
INTRAVENOUS | Status: AC
  Filled 2015-12-19: qty 10

## 2015-12-19 MED ORDER — THROMBIN 20000 UNITS EX SOLR
CUTANEOUS | Status: AC
Start: 2015-12-19 — End: 2015-12-19
  Filled 2015-12-19: qty 20000

## 2015-12-19 MED ORDER — VANCOMYCIN HCL IN DEXTROSE 1-5 GM/200ML-% IV SOLN
1000.0000 mg | Freq: Once | INTRAVENOUS | Status: AC
Start: 2015-12-19 — End: 2015-12-19
  Administered 2015-12-19: 1000 mg via INTRAVENOUS
  Filled 2015-12-19: qty 200

## 2015-12-19 MED ORDER — ACETAMINOPHEN 160 MG/5ML PO SOLN: 650.0000 mg | Freq: Once | ORAL | Status: AC

## 2015-12-19 MED ORDER — HEPARIN SODIUM (PORCINE) 1000 UNIT/ML IJ SOLN
INTRAMUSCULAR | Status: DC | PRN
  Administered 2015-12-19: 53000 [IU] via INTRAVENOUS

## 2015-12-19 MED ORDER — SODIUM CHLORIDE 0.9% FLUSH
3.0000 mL | Freq: Two times a day (BID) | INTRAVENOUS | Status: DC
  Administered 2015-12-20 – 2015-12-21 (×3): 3 mL via INTRAVENOUS

## 2015-12-19 MED ORDER — POTASSIUM CHLORIDE 10 MEQ/50ML IV SOLN: 10.0000 meq | INTRAVENOUS | Status: AC

## 2015-12-19 MED ORDER — EPHEDRINE SULFATE 50 MG/ML IJ SOLN
INTRAMUSCULAR | Status: DC | PRN
  Administered 2015-12-19: 5 mg via INTRAVENOUS

## 2015-12-19 MED ORDER — ADULT MULTIVITAMIN W/MINERALS CH
1.0000 | ORAL_TABLET | Freq: Every day | ORAL | Status: DC
  Administered 2015-12-20 – 2015-12-21 (×2): 1 via ORAL
  Filled 2015-12-19 (×2): qty 1

## 2015-12-19 MED ORDER — INSULIN REGULAR BOLUS VIA INFUSION
0.0000 [IU] | Freq: Three times a day (TID) | INTRAVENOUS | Status: DC
  Filled 2015-12-19: qty 10

## 2015-12-19 MED ORDER — METOPROLOL TARTRATE 5 MG/5ML IV SOLN: 2.5000 mg | INTRAVENOUS | Status: DC | PRN

## 2015-12-19 MED ORDER — FENTANYL CITRATE (PF) 250 MCG/5ML IJ SOLN
INTRAMUSCULAR | Status: AC
  Filled 2015-12-19: qty 5

## 2015-12-19 MED ORDER — NITROGLYCERIN IN D5W 200-5 MCG/ML-% IV SOLN: 0.0000 ug/min | INTRAVENOUS | Status: DC

## 2015-12-19 MED ORDER — SODIUM CHLORIDE 0.9% FLUSH: 3.0000 mL | INTRAVENOUS | Status: DC | PRN

## 2015-12-19 MED ORDER — SODIUM CHLORIDE 0.9 % IV SOLN
INTRAVENOUS | Status: AC
  Filled 2015-12-19 (×2): qty 2.5

## 2015-12-19 MED ORDER — PHENYLEPHRINE HCL 10 MG/ML IJ SOLN
0.0000 ug/min | INTRAVENOUS | Status: DC
  Administered 2015-12-20: 50 ug/min via INTRAVENOUS
  Filled 2015-12-19 (×4): qty 2

## 2015-12-19 MED ORDER — SODIUM CHLORIDE 0.9 % IV SOLN
250.0000 mL | INTRAVENOUS | Status: DC
  Administered 2015-12-20: 250 mL via INTRAVENOUS

## 2015-12-19 MED ORDER — MIDAZOLAM HCL 5 MG/5ML IJ SOLN
INTRAMUSCULAR | Status: DC | PRN
  Administered 2015-12-19 (×5): 2 mg via INTRAVENOUS

## 2015-12-19 MED ORDER — PANTOPRAZOLE SODIUM 40 MG PO TBEC
40.0000 mg | DELAYED_RELEASE_TABLET | Freq: Every day | ORAL | Status: DC
  Administered 2015-12-21: 40 mg via ORAL
  Filled 2015-12-19: qty 1

## 2015-12-19 MED ORDER — MAGNESIUM SULFATE 4 GM/100ML IV SOLN
4.0000 g | Freq: Once | INTRAVENOUS | Status: AC
Start: 2015-12-19 — End: 2015-12-19
  Administered 2015-12-19: 4 g via INTRAVENOUS
  Filled 2015-12-19: qty 100

## 2015-12-19 MED ORDER — ALBUMIN HUMAN 5 % IV SOLN
250.0000 mL | INTRAVENOUS | Status: AC | PRN
  Administered 2015-12-19: 250 mL via INTRAVENOUS

## 2015-12-19 MED ORDER — METOPROLOL TARTRATE 12.5 MG HALF TABLET
12.5000 mg | ORAL_TABLET | Freq: Two times a day (BID) | ORAL | Status: DC
  Administered 2015-12-20: 12.5 mg via ORAL
  Filled 2015-12-19: qty 1

## 2015-12-19 MED ORDER — ACETAMINOPHEN 160 MG/5ML PO SOLN: 1000.0000 mg | Freq: Four times a day (QID) | ORAL | Status: DC

## 2015-12-19 MED ORDER — ROCURONIUM BROMIDE 10 MG/ML (PF) SYRINGE
PREFILLED_SYRINGE | INTRAVENOUS | Status: DC | PRN
  Administered 2015-12-19 (×8): 50 mg via INTRAVENOUS

## 2015-12-19 MED ORDER — TRAMADOL HCL 50 MG PO TABS
50.0000 mg | ORAL_TABLET | ORAL | Status: DC | PRN
  Administered 2015-12-20 – 2015-12-21 (×2): 100 mg via ORAL
  Filled 2015-12-19 (×2): qty 2

## 2015-12-19 MED ORDER — BISACODYL 5 MG PO TBEC
10.0000 mg | DELAYED_RELEASE_TABLET | Freq: Every day | ORAL | Status: DC
  Administered 2015-12-20 – 2015-12-21 (×2): 10 mg via ORAL
  Filled 2015-12-19 (×2): qty 2

## 2015-12-19 MED ORDER — 0.9 % SODIUM CHLORIDE (POUR BTL) OPTIME
TOPICAL | Status: DC | PRN
  Administered 2015-12-19: 5000 mL

## 2015-12-19 MED ORDER — CHLORHEXIDINE GLUCONATE 0.12 % MT SOLN
15.0000 mL | OROMUCOSAL | Status: AC
  Administered 2015-12-19: 15 mL via OROMUCOSAL

## 2015-12-19 MED ORDER — MIDAZOLAM HCL 2 MG/2ML IJ SOLN: 2.0000 mg | INTRAMUSCULAR | Status: DC | PRN

## 2015-12-19 MED ORDER — CALCIUM CHLORIDE 10 % IV SOLN
INTRAVENOUS | Status: DC | PRN
  Administered 2015-12-19 (×2): 200 mg via INTRAVENOUS

## 2015-12-19 MED ORDER — LACTATED RINGERS IV SOLN: INTRAVENOUS | Status: DC

## 2015-12-19 MED ORDER — ONDANSETRON HCL 4 MG/2ML IJ SOLN
4.0000 mg | Freq: Four times a day (QID) | INTRAMUSCULAR | Status: DC | PRN
Start: 2015-12-19 — End: 2015-12-22
  Administered 2015-12-19 – 2015-12-20 (×3): 4 mg via INTRAVENOUS
  Filled 2015-12-19 (×2): qty 2

## 2015-12-19 MED ORDER — LACTATED RINGERS IV SOLN: 500.0000 mL | Freq: Once | INTRAVENOUS | Status: DC | PRN

## 2015-12-19 MED ORDER — ACETAMINOPHEN 500 MG PO TABS
1000.0000 mg | ORAL_TABLET | Freq: Four times a day (QID) | ORAL | Status: DC
  Administered 2015-12-19 – 2015-12-22 (×8): 1000 mg via ORAL
  Filled 2015-12-19 (×8): qty 2

## 2015-12-19 MED ORDER — SODIUM CHLORIDE 0.9 % IV SOLN: INTRAVENOUS | Status: DC

## 2015-12-19 MED ORDER — ACETAMINOPHEN 650 MG RE SUPP
650.0000 mg | Freq: Once | RECTAL | Status: AC
  Administered 2015-12-19: 650 mg via RECTAL

## 2015-12-19 MED ORDER — LEVOFLOXACIN IN D5W 750 MG/150ML IV SOLN
750.0000 mg | INTRAVENOUS | Status: AC
  Administered 2015-12-20: 750 mg via INTRAVENOUS
  Filled 2015-12-19: qty 150

## 2015-12-19 MED ORDER — HEMOSTATIC AGENTS (NO CHARGE) OPTIME
TOPICAL | Status: DC | PRN
  Administered 2015-12-19: 1 via TOPICAL

## 2015-12-19 MED ORDER — CHLORHEXIDINE GLUCONATE 0.12 % MT SOLN
OROMUCOSAL | Status: AC
  Filled 2015-12-19: qty 15

## 2015-12-19 MED ORDER — LACTATED RINGERS IV SOLN
INTRAVENOUS | Status: DC | PRN
  Administered 2015-12-19 (×2): via INTRAVENOUS

## 2015-12-19 MED ORDER — SODIUM CHLORIDE 0.45 % IV SOLN
INTRAVENOUS | Status: DC | PRN
  Administered 2015-12-19: 20 mL/h via INTRAVENOUS

## 2015-12-19 MED ORDER — SUCCINYLCHOLINE CHLORIDE 20 MG/ML IJ SOLN
INTRAMUSCULAR | Status: DC | PRN
  Administered 2015-12-19: 120 mg via INTRAVENOUS

## 2015-12-19 MED ORDER — ROSUVASTATIN CALCIUM 10 MG PO TABS
40.0000 mg | ORAL_TABLET | Freq: Every day | ORAL | Status: DC
  Administered 2015-12-20 – 2015-12-24 (×5): 40 mg via ORAL
  Filled 2015-12-19: qty 1
  Filled 2015-12-19: qty 4
  Filled 2015-12-19: qty 1
  Filled 2015-12-19: qty 4
  Filled 2015-12-19: qty 1
  Filled 2015-12-19 (×3): qty 2

## 2015-12-19 MED ORDER — METOPROLOL TARTRATE 25 MG/10 ML ORAL SUSPENSION: 12.5000 mg | Freq: Two times a day (BID) | ORAL | Status: DC

## 2015-12-19 MED ORDER — DEXMEDETOMIDINE HCL IN NACL 200 MCG/50ML IV SOLN
0.0000 ug/kg/h | INTRAVENOUS | Status: DC
  Filled 2015-12-19: qty 50

## 2015-12-19 MED ORDER — LACTATED RINGERS IV SOLN
INTRAVENOUS | Status: DC | PRN
  Administered 2015-12-19 (×2): via INTRAVENOUS

## 2015-12-19 MED ORDER — PROTAMINE SULFATE 10 MG/ML IV SOLN
INTRAVENOUS | Status: DC | PRN
  Administered 2015-12-19: 20 mg via INTRAVENOUS
  Administered 2015-12-19: 330 mg via INTRAVENOUS

## 2015-12-19 MED ORDER — PHENYLEPHRINE HCL 10 MG/ML IJ SOLN: INTRAMUSCULAR | Status: DC | PRN

## 2015-12-19 MED ORDER — FLUOXETINE HCL 20 MG PO CAPS
40.0000 mg | ORAL_CAPSULE | Freq: Every day | ORAL | Status: DC
  Administered 2015-12-20 – 2015-12-24 (×5): 40 mg via ORAL
  Filled 2015-12-19 (×5): qty 2

## 2015-12-19 MED ORDER — FENTANYL CITRATE (PF) 250 MCG/5ML IJ SOLN
INTRAMUSCULAR | Status: AC
  Filled 2015-12-19: qty 25

## 2015-12-19 MED ORDER — CHLORHEXIDINE GLUCONATE 4 % EX LIQD: 30.0000 mL | CUTANEOUS | Status: DC

## 2015-12-19 MED ORDER — MORPHINE SULFATE (PF) 2 MG/ML IV SOLN
2.0000 mg | INTRAVENOUS | Status: DC | PRN
  Administered 2015-12-19 – 2015-12-20 (×2): 2 mg via INTRAVENOUS
  Administered 2015-12-20: 4 mg via INTRAVENOUS
  Administered 2015-12-20 (×2): 2 mg via INTRAVENOUS
  Administered 2015-12-20: 1 mg via INTRAVENOUS
  Administered 2015-12-20: 4 mg via INTRAVENOUS
  Administered 2015-12-20: 2 mg via INTRAVENOUS
  Administered 2015-12-20: 4 mg via INTRAVENOUS
  Administered 2015-12-20 – 2015-12-21 (×2): 2 mg via INTRAVENOUS
  Administered 2015-12-22 (×2): 4 mg via INTRAVENOUS
  Filled 2015-12-19 (×4): qty 1
  Filled 2015-12-19 (×4): qty 2
  Filled 2015-12-19: qty 1
  Filled 2015-12-19: qty 2
  Filled 2015-12-19 (×2): qty 1
  Filled 2015-12-19: qty 2
  Filled 2015-12-19: qty 1

## 2015-12-19 MED ORDER — PROPOFOL 10 MG/ML IV BOLUS
INTRAVENOUS | Status: DC | PRN
  Administered 2015-12-19: 120 mg via INTRAVENOUS

## 2015-12-19 MED ORDER — GELATIN ABSORBABLE MT POWD
OROMUCOSAL | Status: DC | PRN
  Administered 2015-12-19: 20 mL via TOPICAL

## 2015-12-19 MED ORDER — LEVALBUTEROL HCL 1.25 MG/0.5ML IN NEBU: 1.2500 mg | INHALATION_SOLUTION | Freq: Four times a day (QID) | RESPIRATORY_TRACT | Status: DC | PRN

## 2015-12-19 MED FILL — Heparin Sodium (Porcine) Inj 1000 Unit/ML: INTRAMUSCULAR | Qty: 30 | Status: AC

## 2015-12-19 MED FILL — Potassium Chloride Inj 2 mEq/ML: INTRAVENOUS | Qty: 40 | Status: AC

## 2015-12-19 MED FILL — Magnesium Sulfate Inj 50%: INTRAMUSCULAR | Qty: 10 | Status: AC

## 2015-12-19 SURGICAL SUPPLY — 106 items
BAG DECANTER FOR FLEXI CONT (MISCELLANEOUS) ×3 IMPLANT
BANDAGE ACE 4X5 VEL STRL LF (GAUZE/BANDAGES/DRESSINGS) ×6 IMPLANT
BANDAGE ACE 6X5 VEL STRL LF (GAUZE/BANDAGES/DRESSINGS) ×6 IMPLANT
BASKET HEART (ORDER IN 25'S) (MISCELLANEOUS) ×1
BASKET HEART (ORDER IN 25S) (MISCELLANEOUS) ×2 IMPLANT
BLADE 11 SAFETY STRL DISP (BLADE) ×3 IMPLANT
BLADE STERNUM SYSTEM 6 (BLADE) ×3 IMPLANT
BNDG GAUZE ELAST 4 BULKY (GAUZE/BANDAGES/DRESSINGS) ×6 IMPLANT
CANISTER SUCTION 2500CC (MISCELLANEOUS) ×3 IMPLANT
CANN PRFSN .5XCNCT 15X34-48 (MISCELLANEOUS) ×2
CANNULA ARTERIAL NVNT 3/8 22FR (MISCELLANEOUS) ×3 IMPLANT
CANNULA PRFSN .5XCNCT 15X34-48 (MISCELLANEOUS) ×2 IMPLANT
CANNULA VEN 2 STAGE (MISCELLANEOUS) ×1
CATH ROBINSON RED A/P 18FR (CATHETERS) ×6 IMPLANT
CATH THORACIC 28FR (CATHETERS) ×3 IMPLANT
CATH THORACIC 36FR (CATHETERS) ×3 IMPLANT
CATH THORACIC 36FR RT ANG (CATHETERS) ×3 IMPLANT
CLIP TI MEDIUM 24 (CLIP) IMPLANT
CLIP TI WIDE RED SMALL 24 (CLIP) ×9 IMPLANT
CRADLE DONUT ADULT HEAD (MISCELLANEOUS) ×3 IMPLANT
DERMABOND ADVANCED (GAUZE/BANDAGES/DRESSINGS) ×1
DERMABOND ADVANCED .7 DNX12 (GAUZE/BANDAGES/DRESSINGS) ×2 IMPLANT
DRAPE CARDIOVASCULAR INCISE (DRAPES) ×1
DRAPE SLUSH/WARMER DISC (DRAPES) ×3 IMPLANT
DRAPE SRG 135X102X78XABS (DRAPES) ×2 IMPLANT
DRSG COVADERM 4X14 (GAUZE/BANDAGES/DRESSINGS) ×3 IMPLANT
ELECT CAUTERY BLADE 6.4 (BLADE) ×3 IMPLANT
ELECT REM PT RETURN 9FT ADLT (ELECTROSURGICAL) ×6
ELECTRODE REM PT RTRN 9FT ADLT (ELECTROSURGICAL) ×4 IMPLANT
FELT TEFLON 1X6 (MISCELLANEOUS) ×6 IMPLANT
GAUZE SPONGE 4X4 12PLY STRL (GAUZE/BANDAGES/DRESSINGS) ×6 IMPLANT
GLOVE BIO SURGEON STRL SZ 6 (GLOVE) IMPLANT
GLOVE BIO SURGEON STRL SZ 6.5 (GLOVE) ×18 IMPLANT
GLOVE BIO SURGEON STRL SZ7 (GLOVE) IMPLANT
GLOVE BIO SURGEON STRL SZ7.5 (GLOVE) IMPLANT
GLOVE BIOGEL PI IND STRL 6 (GLOVE) IMPLANT
GLOVE BIOGEL PI IND STRL 6.5 (GLOVE) ×10 IMPLANT
GLOVE BIOGEL PI IND STRL 7.0 (GLOVE) IMPLANT
GLOVE BIOGEL PI INDICATOR 6 (GLOVE)
GLOVE BIOGEL PI INDICATOR 6.5 (GLOVE) ×5
GLOVE BIOGEL PI INDICATOR 7.0 (GLOVE)
GLOVE EUDERMIC 7 POWDERFREE (GLOVE) ×6 IMPLANT
GLOVE ORTHO TXT STRL SZ7.5 (GLOVE) IMPLANT
GOWN STRL REUS W/ TWL LRG LVL3 (GOWN DISPOSABLE) ×16 IMPLANT
GOWN STRL REUS W/ TWL XL LVL3 (GOWN DISPOSABLE) ×2 IMPLANT
GOWN STRL REUS W/TWL LRG LVL3 (GOWN DISPOSABLE) ×8
GOWN STRL REUS W/TWL XL LVL3 (GOWN DISPOSABLE) ×1
HEMOSTAT POWDER SURGIFOAM 1G (HEMOSTASIS) ×9 IMPLANT
HEMOSTAT SURGICEL 2X14 (HEMOSTASIS) ×3 IMPLANT
INSERT FOGARTY 61MM (MISCELLANEOUS) IMPLANT
INSERT FOGARTY XLG (MISCELLANEOUS) ×3 IMPLANT
KIT BASIN OR (CUSTOM PROCEDURE TRAY) ×3 IMPLANT
KIT CATH CPB BARTLE (MISCELLANEOUS) ×3 IMPLANT
KIT ROOM TURNOVER OR (KITS) ×3 IMPLANT
KIT SUCTION CATH 14FR (SUCTIONS) ×3 IMPLANT
KIT VASOVIEW HEMOPRO VH 3000 (KITS) ×3 IMPLANT
NS IRRIG 1000ML POUR BTL (IV SOLUTION) ×15 IMPLANT
PACK OPEN HEART (CUSTOM PROCEDURE TRAY) ×3 IMPLANT
PAD ARMBOARD 7.5X6 YLW CONV (MISCELLANEOUS) ×6 IMPLANT
PAD ELECT DEFIB RADIOL ZOLL (MISCELLANEOUS) ×3 IMPLANT
PENCIL BUTTON HOLSTER BLD 10FT (ELECTRODE) ×3 IMPLANT
PUNCH AORTIC ROTATE 4.0MM (MISCELLANEOUS) IMPLANT
PUNCH AORTIC ROTATE 4.5MM 8IN (MISCELLANEOUS) ×3 IMPLANT
PUNCH AORTIC ROTATE 5MM 8IN (MISCELLANEOUS) IMPLANT
SET CARDIOPLEGIA MPS 5001102 (MISCELLANEOUS) ×3 IMPLANT
SPONGE GAUZE 4X4 12PLY STER LF (GAUZE/BANDAGES/DRESSINGS) ×6 IMPLANT
SPONGE INTESTINAL PEANUT (DISPOSABLE) IMPLANT
SPONGE LAP 18X18 X RAY DECT (DISPOSABLE) ×12 IMPLANT
SPONGE LAP 4X18 X RAY DECT (DISPOSABLE) ×6 IMPLANT
STAPLER VISISTAT 35W (STAPLE) ×3 IMPLANT
SUT BONE WAX W31G (SUTURE) ×3 IMPLANT
SUT MNCRL AB 4-0 PS2 18 (SUTURE) ×9 IMPLANT
SUT PROLENE 3 0 SH DA (SUTURE) IMPLANT
SUT PROLENE 3 0 SH1 36 (SUTURE) ×3 IMPLANT
SUT PROLENE 4 0 RB 1 (SUTURE) ×1
SUT PROLENE 4 0 SH DA (SUTURE) IMPLANT
SUT PROLENE 4-0 RB1 .5 CRCL 36 (SUTURE) ×2 IMPLANT
SUT PROLENE 5 0 C 1 36 (SUTURE) IMPLANT
SUT PROLENE 6 0 C 1 30 (SUTURE) IMPLANT
SUT PROLENE 7 0 BV 1 (SUTURE) IMPLANT
SUT PROLENE 7 0 BV1 MDA (SUTURE) ×3 IMPLANT
SUT PROLENE 8 0 BV175 6 (SUTURE) IMPLANT
SUT SILK  1 MH (SUTURE)
SUT SILK 1 MH (SUTURE) IMPLANT
SUT STEEL STERNAL CCS#1 18IN (SUTURE) IMPLANT
SUT STEEL SZ 6 DBL 3X14 BALL (SUTURE) ×9 IMPLANT
SUT VIC AB 1 CTX 36 (SUTURE) ×2
SUT VIC AB 1 CTX36XBRD ANBCTR (SUTURE) ×4 IMPLANT
SUT VIC AB 2-0 CT1 27 (SUTURE) ×4
SUT VIC AB 2-0 CT1 TAPERPNT 27 (SUTURE) ×8 IMPLANT
SUT VIC AB 2-0 CTX 27 (SUTURE) IMPLANT
SUT VIC AB 3-0 SH 27 (SUTURE)
SUT VIC AB 3-0 SH 27X BRD (SUTURE) IMPLANT
SUT VIC AB 3-0 X1 27 (SUTURE) IMPLANT
SUT VICRYL 4-0 PS2 18IN ABS (SUTURE) IMPLANT
SUTURE E-PAK OPEN HEART (SUTURE) ×3 IMPLANT
SYSTEM SAHARA CHEST DRAIN ATS (WOUND CARE) ×3 IMPLANT
TAPE CLOTH SURG 4X10 WHT LF (GAUZE/BANDAGES/DRESSINGS) ×3 IMPLANT
TOWEL OR 17X24 6PK STRL BLUE (TOWEL DISPOSABLE) ×3 IMPLANT
TOWEL OR 17X26 10 PK STRL BLUE (TOWEL DISPOSABLE) ×3 IMPLANT
TRAY FOLEY IC TEMP SENS 16FR (CATHETERS) ×3 IMPLANT
TUBE CONNECTING 12X1/4 (SUCTIONS) ×3 IMPLANT
TUBING INSUFFLATION (TUBING) ×3 IMPLANT
UNDERPAD 30X30 (UNDERPADS AND DIAPERS) ×3 IMPLANT
WATER STERILE IRR 1000ML POUR (IV SOLUTION) ×6 IMPLANT
YANKAUER SUCT BULB TIP NO VENT (SUCTIONS) ×3 IMPLANT

## 2015-12-19 NOTE — Anesthesia Procedure Notes (Signed)
Central Venous Catheter Insertion Performed by: anesthesiologist 12/19/2015 7:00 AM Patient location: Pre-op. Preanesthetic checklist: patient identified, IV checked, site marked, risks and benefits discussed, surgical consent, monitors and equipment checked, pre-op evaluation, timeout performed and anesthesia consent Position: Trendelenburg Lidocaine 1% used for infiltration Landmarks identified and Seldinger technique used Catheter size: 9 Fr Central line was placed.Double lumen Procedure performed using ultrasound guided technique. Attempts: 1 Following insertion, dressing applied, line sutured and Biopatch. Post procedure assessment: blood return through all ports. Patient tolerated the procedure well with no immediate complications.

## 2015-12-19 NOTE — Interval H&P Note (Signed)
History and Physical Interval Note:  12/19/2015 6:10 AM  Brett Drake  has presented today for surgery, with the diagnosis of CAD  The various methods of treatment have been discussed with the patient and family. After consideration of risks, benefits and other options for treatment, the patient has consented to  Procedure(s): CORONARY ARTERY BYPASS GRAFTING (CABG) (N/A) TRANSESOPHAGEAL ECHOCARDIOGRAM (TEE) (N/A) as a surgical intervention .  The patient's history has been reviewed, patient examined, no change in status, stable for surgery.  I have reviewed the patient's chart and labs.  Questions were answered to the patient's satisfaction.     Alleen BorneBryan K Helaine Yackel

## 2015-12-19 NOTE — Transfer of Care (Signed)
Immediate Anesthesia Transfer of Care Note  Patient: Brett CoffinChristopher Drake  Procedure(s) Performed: Procedure(s): coronary artery bypass graft times two  using left internal mammary artery and endoscopic left saphenous vein harvest (Bilateral) TRANSESOPHAGEAL ECHOCARDIOGRAM (TEE) (N/A)  Patient Location: ICU  Anesthesia Type:General  Level of Consciousness: sedated and Patient remains intubated per anesthesia plan  Airway & Oxygen Therapy: Patient remains intubated per anesthesia plan and Patient placed on Ventilator (see vital sign flow sheet for setting)  Post-op Assessment: Report given to RN and Post -op Vital signs reviewed and stable  Post vital signs: Reviewed and stable  o2 sats upon arrival to icu 92%. RN notified that pt needed recruitment breaths before leaving OR room; BP 107/57 ART.  Pacing on pacemaker  Last Vitals:  Vitals:   12/19/15 0610 12/19/15 1341  BP: (!) 119/55 (!) 100/54  Pulse: 73 90  Resp: 20 17  Temp: 36.8 C     Last Pain:  Vitals:   12/19/15 0610  TempSrc: Oral         Complications: No apparent anesthesia complications

## 2015-12-19 NOTE — Anesthesia Procedure Notes (Signed)
Central Venous Catheter Insertion Performed by: anesthesiologist 12/19/2015 7:00 AM Patient location: Pre-op. Preanesthetic checklist: patient identified, IV checked, site marked, risks and benefits discussed, surgical consent, monitors and equipment checked, pre-op evaluation, timeout performed and anesthesia consent Landmarks identified PA cath was placed.Swan type and PA catheter depth:thermodilationProcedure performed using ultrasound guided technique. Attempts: 1 Patient tolerated the procedure well with no immediate complications.

## 2015-12-19 NOTE — OR Nursing (Signed)
SICU calls [phone]                    [phone]                     [phone]

## 2015-12-19 NOTE — Progress Notes (Signed)
Intraoperative TEE has been performed. 

## 2015-12-19 NOTE — Progress Notes (Signed)
CT surgery p.m. Rounds  Patient stable extubated and conversant after CABG earlier today Minimal chest tube drainage Sinus rhythm Off pressor worse Blood sugar control with insulin IV infusion

## 2015-12-19 NOTE — Anesthesia Procedure Notes (Signed)
Procedure Name: Intubation Date/Time: 12/19/2015 7:42 AM Performed by: Adonis HousekeeperNGELL, Bracen Schum M Pre-anesthesia Checklist: Patient identified, Emergency Drugs available, Suction available and Patient being monitored Patient Re-evaluated:Patient Re-evaluated prior to inductionOxygen Delivery Method: Circle system utilized Preoxygenation: Pre-oxygenation with 100% oxygen Intubation Type: IV induction Ventilation: Mask ventilation without difficulty Laryngoscope Size: Mac and 4 Grade View: Grade III Tube type: Oral Tube size: 8.0 mm Number of attempts: 1 Airway Equipment and Method: Stylet Placement Confirmation: ETT inserted through vocal cords under direct vision,  positive ETCO2 and breath sounds checked- equal and bilateral Secured at: 22 cm Tube secured with: Tape Dental Injury: Teeth and Oropharynx as per pre-operative assessment

## 2015-12-19 NOTE — Progress Notes (Signed)
Patient placed on CPAP without complications, RT will continue to monitor as needed.

## 2015-12-19 NOTE — Anesthesia Postprocedure Evaluation (Signed)
Anesthesia Post Note  Patient: Brett Drake  Procedure(s) Performed: Procedure(s) (LRB): coronary artery bypass graft times two  using left internal mammary artery and endoscopic left saphenous vein harvest (Bilateral) TRANSESOPHAGEAL ECHOCARDIOGRAM (TEE) (N/A)  Patient location during evaluation: SICU Anesthesia Type: General Level of consciousness: patient remains intubated per anesthesia plan and sedated Pain management: pain level controlled Vital Signs Assessment: post-procedure vital signs reviewed and stable Respiratory status: patient remains intubated per anesthesia plan Cardiovascular status: stable Anesthetic complications: no    Last Vitals:  Vitals:   12/19/15 1430 12/19/15 1500  BP:  98/60  Pulse: 84 80  Resp: 19 (!) 22  Temp: 36.4 C     Last Pain:  Vitals:   12/19/15 0610  TempSrc: Oral                 Shelton SilvasKevin D Rondel Episcopo

## 2015-12-19 NOTE — Brief Op Note (Signed)
12/19/2015  11:38 AM  PATIENT:  Brett Drake  45 y.o. male  PRE-OPERATIVE DIAGNOSIS:  CAD  POST-OPERATIVE DIAGNOSIS:  CAD  PROCEDURE:  TRANSESOPHAGEAL ECHOCARDIOGRAM (TEE),MEDIAN STERNOTOMY for CABG x 2 (LIMA to LAD, SVG to RAMUS INTERMEDIATE) using LEFT GREATER SAPHENOUS VEIN and LEFT INTERNAL MAMMARY ARTERY  FINDINGS: PDA too small to graft  SURGEON:  Surgeon(s) and Role:    * Alleen BorneBryan K Bartle, MD - Primary  PHYSICIAN ASSISTANT: Doree Fudgeonielle Ashaunti Treptow PA-C  ANESTHESIA:   general  EBL:  Total I/O In: -  Out: 550 [Urine:550]  DRAINS: Chest tubes placed in the mediastinal and pleural spaces   COUNTS CORRECT:  YES  DICTATION: .Dragon Dictation  PLAN OF CARE: Admit to inpatient   PATIENT DISPOSITION:  ICU - intubated and hemodynamically stable.   Delay start of Pharmacological VTE agent (>24hrs) due to surgical blood loss or risk of bleeding: yes  BASELINE WEIGHT: 143 kg

## 2015-12-19 NOTE — Op Note (Signed)
CARDIOVASCULAR SURGERY OPERATIVE NOTE  12/19/2015  Surgeon:  Alleen Borne, MD  First Assistant: Doree Fudge,  PA-C   Preoperative Diagnosis:  Severe multi-vessel coronary artery disease   Postoperative Diagnosis:  Same   Procedure:  1. Median Sternotomy 2. Extracorporeal circulation 3.   Coronary artery bypass grafting x 2   Left internal mammary graft to the LAD  SVG to Ramus   4.   Endoscopic vein harvest from the left leg   Anesthesia:  General Endotracheal   Clinical History/Surgical Indication:   The patient is a morbidly obese 45 year old poorly controlled diabetic with hypertension and hyperlipidemia, severe OSA on CPAP, a long history of coronary artery disease s/p PCI and DES x 2 to an occluded RCA 07/2008 after presenting in [place] with an acute IMI. He had an ICD placed in 2013 in [place] for ischemic cardiomyopathy with an EF of 35% and inducible VT. He has not had any chest pain, shortness of breath or orthopnea/PND but was noted on pacemaker transmission to have volume overload and tachycardia. He had a nuclear stress which was felt to be a high risk study with a very large severe defect in the inferior, inferolateral, inferoapical walls consistent with scar with moderate peri-infarct ischemia especially involving the lateral wall. There was also a moderate-sized anterior wall scar from the base towards the apex with very mild peri-infarct ischemia. LVEF was 31% with marked global hypokinesis and inferior akinesis. Cath on 11/28/2015 showed a dilated LV with an LVEF of 35%. The RCA was occluded proximally with faint collaterals from the left to the PDA. The LAD had 70-80% proximal stenosis with an FFR of 0.77. The LCX has a moderate sized Ramus with 90% ostial stenosis. The remainder of the LCX is small and diffusely diseased. Right heart cath not done. His last echo  on 11/06/2015 showed an EF of 50-55% with normal LV cavity size and a mild decrease in global wall motion. There was trace MR.   This 45 year old morbidly obese, poorly controlled diabetic has severe multi-vessel coronary artery disease and ischemic cardiomyopathy with a high risk nuclear stress test. His EF by echo 11/06/2015 was recorded at 50-55% but it looked more like 35-40% by cath. I have personally reviewed and interpreted his cath films. He has a significant proximal LAD stenosis and the distal vessel appears graftable. The Ramus has a tight ostial stenosis and is a moderate-sized graftable vessel. The remainder of the LCX is small caliber and not graftable and at risk for ongoing ischemia. The RCA is occluded proximally and the PDA fills faintly and appears small and diffusely diseased. It may not be graftable. I think CABG is probably the best treatment for him although his long term prognosis is going to be poor if he does not get his multiple risk factors under good control. His operative risk is increased due to his morbid obesity, poorly controlled DM, and LV dysfunction. I discussed the operative procedure with the patient and his wifeincluding alternatives, benefits and risks; including but not limited to bleeding, blood transfusion, infection, stroke, myocardial infarction, graft failure, heart block requiring a permanent pacemaker, organ dysfunction, and death. Jowel McDowellunderstands and agrees to proceed.   Preparation:  The patient was seen in the preoperative holding area and the correct patient, correct operation were confirmed with the patient after reviewing the medical record and catheterization. The consent was signed by me. Preoperative antibiotics were given. A pulmonary arterial line and radial arterial line  were placed by the anesthesia team. The patient was taken back to the operating room and positioned supine on the operating room table. After being placed under  general endotracheal anesthesia by the anesthesia team a foley catheter was placed. The neck, chest, abdomen, and both legs were prepped with betadine soap and solution and draped in the usual sterile manner. A surgical time-out was taken and the correct patient and operative procedure were confirmed with the nursing and anesthesia staff.  TEE: performed by Shona Simpson, MD  This showed an EF of 35% with a dilated LV with global hypokinesis and inferoapical akinesis. Trivial MR. No AI. Post-bypass TEE showed improvement of LV function.   Cardiopulmonary Bypass:  A median sternotomy was performed. The pericardium was opened in the midline. Right ventricular function appeared normal. The ascending aorta was of normal size and had no palpable plaque. There were no contraindications to aortic cannulation or cross-clamping. The patient was fully systemically heparinized and the ACT was maintained > 400 sec. BSA was 2.6. The proximal aortic arch was cannulated with a 22 F aortic cannula for arterial inflow. Venous cannulation was performed via the right atrial appendage using a two-staged venous cannula. An antegrade cardioplegia/vent cannula was inserted into the mid-ascending aorta. Aortic occlusion was performed with a single cross-clamp. Systemic cooling to 32 degrees Centigrade and topical cooling of the heart with iced saline were used. Hyperkalemic antegrade cold blood cardioplegia was used to induce diastolic arrest and was then given at about 20 minute intervals throughout the period of arrest to maintain myocardial temperature at or below 10 degrees centigrade. A temperature probe was inserted into the interventricular septum and an insulating pad was placed in the pericardium.   Left internal mammary harvest:  The left side of the sternum was retracted using the Rultract retractor. Due to his size it was difficult to see under the sternum. The left internal mammary artery was harvested as a  pedicle graft. All side branches were clipped. It was a medium-sized vessel of good quality with excellent blood flow. It was ligated distally and divided. It was sprayed with topical papaverine solution to prevent vasospasm.   Endoscopic vein harvest:  The left greater saphenous vein was harvested endoscopically through a 2 cm incision medial to the left knee. It was harvested from the upper thigh to below the knee. There were some large branchs that could not be adequately divided endoscopically so small bridging incisions were made over these areas. It was a medium-sized vein of good quality. The side branches were all ligated with 4-0 silk ties. We initially examined the right GSV medial to the right knee but it was very superficial and small and therefore we felt that the left vein would be better.   Coronary arteries:  The coronary arteries were examined.   LAD:  Intramyocardial throughout its extent. Located in the mid-portion after the origin of the diagonal where it had mild plaque. The diagonal appeared to be about the same size as the LAD but was diffusely diseased without any significant stenosis on cath.   LCX:  Moderate-sized ramus was intramyocardial throughout. There was patchy scar and thinning of the muscle in the distribution of this vessel. It was located in its mid-portion within the myocardium and was mildly diseased. The OM branch was tiny and not graftable.  RCA:  Severely and diffusely diseased. The PDA was small and severely diseased and not graftable. There was no soft area of the vessel to open. There  was extensive scar throughout the inferior wall.    Grafts:  1. LIMA to the LAD: 2.0 mm. It was sewn end to side using 8-0 prolene continuous suture. 2. SVG to Ramus:  1.75 mm. It was sewn end to side using 7-0 prolene continuous suture.    The proximal vein graft anastomosis was performed to the mid-ascending aorta using continuous 6-0 prolene suture. A graft  marker was placed around the proximal anastomosis.   Completion:  The patient was rewarmed to 37 degrees Centigrade. The clamp was removed from the LIMA pedicle and there was rapid warming of the septum and return of ventricular fibrillation. The crossclamp was removed with a time of 64 minutes. There was spontaneous return of sinus rhythm. The distal and proximal anastomoses were checked for hemostasis. The position of the grafts was satisfactory. Two temporary epicardial pacing wires were placed on the right atrium and two on the right ventricle. The patient was weaned from CPB without difficulty on dopamine 3 mcg. CPB time was 85 minutes. Cardiac output was 5 LPM. Heparin was fully reversed with protamine and the aortic and venous cannulas removed. Hemostasis was achieved. Mediastinal and left pleural drainage tubes were placed. The sternum was closed with double #6 stainless steel wires. The fascia was closed with continuous # 1 vicryl suture. The subcutaneous tissue was closed with 2-0 vicryl continuous suture. The skin was closed with 3-0 vicryl subcuticular suture. All sponge, needle, and instrument counts were reported correct at the end of the case. Dry sterile dressings were placed over the incisions and around the chest tubes which were connected to pleurevac suction. The patient was then transported to the surgical intensive care unit in critical but stable condition.

## 2015-12-19 NOTE — Procedures (Signed)
Extubation Procedure Note nif >-20, vc .800ml, patient extuabted to 3lpm [place]. Pt tolerating well at this time.   Patient Details:   Name: Brett Drake DOB: [birth year] MRN: [id number]   Airway Documentation:     Evaluation  O2 sats: stable throughout Complications: No apparent complications Patient did tolerate procedure well. Bilateral Breath Sounds: Clear, Diminished   Yes  Brett Drake 12/19/2015, 5:33 PM

## 2015-12-20 ENCOUNTER — Inpatient Hospital Stay (HOSPITAL_COMMUNITY): Payer: Managed Care, Other (non HMO)

## 2015-12-20 LAB — BASIC METABOLIC PANEL
Anion gap: 7 (ref 5–15)
BUN: 12 mg/dL (ref 6–20)
CHLORIDE: 106 mmol/L (ref 101–111)
CO2: 27 mmol/L (ref 22–32)
Calcium: 8.2 mg/dL — ABNORMAL LOW (ref 8.9–10.3)
Creatinine, Ser: 0.82 mg/dL (ref 0.61–1.24)
GFR calc Af Amer: >60 mL/min (ref >60)
GLUCOSE: 128 mg/dL — AB (ref 65–99)
POTASSIUM: 4.2 mmol/L (ref 3.5–5.1)
Sodium: 140 mmol/L (ref 135–145)

## 2015-12-20 LAB — CBC
HEMATOCRIT: 31.5 % — AB (ref 39.0–52.0)
HEMATOCRIT: 30 % — AB (ref 39.0–52.0)
HEMOGLOBIN: 9.2 g/dL — AB (ref 13.0–17.0)
Hemoglobin: 9.6 g/dL — ABNORMAL LOW (ref 13.0–17.0)
MCH: 24.9 pg — AB (ref 26.0–34.0)
MCH: 25.3 pg — AB (ref 26.0–34.0)
MCHC: 30.5 g/dL (ref 30.0–36.0)
MCHC: 30.7 g/dL (ref 30.0–36.0)
MCV: 81.8 fL (ref 78.0–100.0)
MCV: 82.4 fL (ref 78.0–100.0)
PLATELETS: 265 10*3/uL (ref 150–400)
Platelets: 223 10*3/uL (ref 150–400)
RBC: 3.85 MIL/uL — AB (ref 4.22–5.81)
RBC: 3.64 MIL/uL — AB (ref 4.22–5.81)
RDW: 15.2 % (ref 11.5–15.5)
RDW: 15.4 % (ref 11.5–15.5)
WBC: 18.6 10*3/uL — ABNORMAL HIGH (ref 4.0–10.5)
WBC: 19 10*3/uL — AB (ref 4.0–10.5)

## 2015-12-20 LAB — POCT I-STAT, CHEM 8
BUN: 15 mg/dL (ref 6–20)
CREATININE: 0.9 mg/dL (ref 0.61–1.24)
Calcium, Ion: 1.12 mmol/L — ABNORMAL LOW (ref 1.15–1.40)
Chloride: 98 mmol/L — ABNORMAL LOW (ref 101–111)
GLUCOSE: 192 mg/dL — AB (ref 65–99)
HCT: 28 % — ABNORMAL LOW (ref 39.0–52.0)
HEMOGLOBIN: 9.5 g/dL — AB (ref 13.0–17.0)
POTASSIUM: 4.1 mmol/L (ref 3.5–5.1)
Sodium: 138 mmol/L (ref 135–145)
TCO2: 27 mmol/L (ref 0–100)

## 2015-12-20 LAB — GLUCOSE, CAPILLARY
GLUCOSE-CAPILLARY: 147 mg/dL — AB (ref 65–99)
GLUCOSE-CAPILLARY: 135 mg/dL — AB (ref 65–99)
GLUCOSE-CAPILLARY: 119 mg/dL — AB (ref 65–99)
GLUCOSE-CAPILLARY: 129 mg/dL — AB (ref 65–99)
GLUCOSE-CAPILLARY: 145 mg/dL — AB (ref 65–99)
GLUCOSE-CAPILLARY: 123 mg/dL — AB (ref 65–99)
GLUCOSE-CAPILLARY: 134 mg/dL — AB (ref 65–99)
GLUCOSE-CAPILLARY: 228 mg/dL — AB (ref 65–99)
Glucose-Capillary: 120 mg/dL — ABNORMAL HIGH (ref 65–99)
Glucose-Capillary: 121 mg/dL — ABNORMAL HIGH (ref 65–99)
Glucose-Capillary: 139 mg/dL — ABNORMAL HIGH (ref 65–99)
Glucose-Capillary: 139 mg/dL — ABNORMAL HIGH (ref 65–99)
Glucose-Capillary: 144 mg/dL — ABNORMAL HIGH (ref 65–99)
Glucose-Capillary: 149 mg/dL — ABNORMAL HIGH (ref 65–99)
Glucose-Capillary: 249 mg/dL — ABNORMAL HIGH (ref 65–99)
Glucose-Capillary: 96 mg/dL (ref 65–99)

## 2015-12-20 LAB — CREATININE, SERUM
Creatinine, Ser: 0.95 mg/dL (ref 0.61–1.24)
GFR calc non Af Amer: >60 mL/min (ref >60)

## 2015-12-20 LAB — MAGNESIUM
Magnesium: 2.2 mg/dL (ref 1.7–2.4)
Magnesium: 1.9 mg/dL (ref 1.7–2.4)

## 2015-12-20 MED ORDER — DOPAMINE-DEXTROSE 3.2-5 MG/ML-% IV SOLN: 3.0000 ug/kg/min | INTRAVENOUS | Status: DC

## 2015-12-20 MED ORDER — INSULIN DETEMIR 100 UNIT/ML ~~LOC~~ SOLN
12.0000 [IU] | Freq: Two times a day (BID) | SUBCUTANEOUS | Status: DC
  Administered 2015-12-20 (×2): 12 [IU] via SUBCUTANEOUS
  Filled 2015-12-20 (×4): qty 0.12

## 2015-12-20 MED ORDER — INSULIN ASPART 100 UNIT/ML ~~LOC~~ SOLN
0.0000 [IU] | SUBCUTANEOUS | Status: DC
  Administered 2015-12-20: 2 [IU] via SUBCUTANEOUS
  Administered 2015-12-20: 8 [IU] via SUBCUTANEOUS
  Administered 2015-12-20: 2 [IU] via SUBCUTANEOUS
  Administered 2015-12-20: 8 [IU] via SUBCUTANEOUS
  Administered 2015-12-21: 12 [IU] via SUBCUTANEOUS
  Administered 2015-12-21 (×3): 8 [IU] via SUBCUTANEOUS
  Administered 2015-12-21 – 2015-12-22 (×3): 4 [IU] via SUBCUTANEOUS
  Administered 2015-12-22: 2 [IU] via SUBCUTANEOUS

## 2015-12-20 MED ORDER — METOPROLOL TARTRATE 25 MG PO TABS
25.0000 mg | ORAL_TABLET | Freq: Two times a day (BID) | ORAL | Status: DC
  Administered 2015-12-20 – 2015-12-21 (×3): 25 mg via ORAL
  Filled 2015-12-20 (×3): qty 1

## 2015-12-20 MED ORDER — FUROSEMIDE 10 MG/ML IJ SOLN
40.0000 mg | Freq: Every day | INTRAMUSCULAR | Status: DC
  Administered 2015-12-20: 40 mg via INTRAVENOUS
  Filled 2015-12-20: qty 4

## 2015-12-20 NOTE — Progress Notes (Signed)
1 Day Post-Op Procedure(s) (LRB): coronary artery bypass graft times two  using left internal mammary artery and endoscopic left saphenous vein harvest (Bilateral) TRANSESOPHAGEAL ECHOCARDIOGRAM (TEE) (N/A) Subjective: Progressing well after multivessel CABG  Objective: Vital signs in last 24 hours: Temp:  [97.5 F (36.4 C)-98.4 F (36.9 C)] 98.1 F (36.7 C) (09/16 1137) Pulse Rate:  [76-118] 114 (09/16 1000) Cardiac Rhythm: Normal sinus rhythm (09/16 0727) Resp:  [9-28] 17 (09/16 1000) BP: (84-115)/(43-102) 101/66 (09/16 1000) SpO2:  [93 %-100 %] 97 % (09/16 1000) Arterial Line BP: (92-135)/(50-69) 115/65 (09/16 1000) FiO2 (%):  [40 %-50 %] 40 % (09/15 1622) Weight:  [324 lb 8 oz (147.2 kg)] 324 lb 8 oz (147.2 kg) (09/16 0545)  Hemodynamic parameters for last 24 hours: PAP: (21-43)/(14-26) 35/20 CO:  [5.1 L/min-8.1 L/min] 6.5 L/min CI:  [2 L/min/m2-3.2 L/min/m2] 2.6 L/min/m2  Intake/Output from previous day: 09/15 0701 - 09/16 0700 In: 5046.4 [I.V.:4066.4; Blood:350; NG/GT:30; IV Piggyback:600] Out: 3845 [Urine:2835; Blood:700; Chest Tube:310] Intake/Output this shift: Total I/O In: 112.5 [I.V.:112.5] Out: 225 [Urine:225]       Exam    General- alert and comfortable   Lungs- clear without rales, wheezes   Cor- regular rate and rhythm, no murmur , gallop   Abdomen- soft, non-tender   Extremities - warm, non-tender, minimal edema   Neuro- oriented, appropriate, no focal weakness   Lab Results:  Recent Labs  12/19/15 2004 12/20/15 0415  WBC 23.3* 18.6*  HGB 10.1* 9.6*  HCT 32.5* 31.5*  PLT 263 265   BMET:  Recent Labs  12/19/15 2001 12/19/15 2004 12/20/15 0415  NA 140  --  140  K 4.3  --  4.2  CL 103  --  106  CO2  --   --  27  GLUCOSE 155*  --  128*  BUN 13  --  12  CREATININE 0.70 0.86 0.82  CALCIUM  --   --  8.2*    PT/INR:  Recent Labs  12/19/15 1400  LABPROT 16.3*  INR 1.30   ABG    Component Value Date/Time   PHART 7.306 (L)  12/19/2015 1825   HCO3 24.8 12/19/2015 1825   TCO2 26 12/19/2015 2001   ACIDBASEDEF 2.0 12/19/2015 1825   O2SAT 97.0 12/19/2015 1825   CBG (last 3)   Recent Labs  12/20/15 0821 12/20/15 1003 12/20/15 1137  GLUCAP 129* 145* 144*    Assessment/Plan: S/P Procedure(s) (LRB): coronary artery bypass graft times two  using left internal mammary artery and endoscopic left saphenous vein harvest (Bilateral) TRANSESOPHAGEAL ECHOCARDIOGRAM (TEE) (N/A) Mobilize Diuresis Diabetes control d/c tubes/lines Continue low dose dopamine for low EF   LOS: 1 day    Kathlee Nationseter Van Trigt III 12/20/2015

## 2015-12-20 NOTE — Progress Notes (Signed)
CT surgery p.m. Rounds  Feels better this p.m. Ambulated in the hallway twice Diuresing well P.m. labs satisfactory

## 2015-12-21 ENCOUNTER — Inpatient Hospital Stay (HOSPITAL_COMMUNITY): Payer: Managed Care, Other (non HMO)

## 2015-12-21 ENCOUNTER — Encounter (HOSPITAL_COMMUNITY): Payer: Self-pay

## 2015-12-21 LAB — BASIC METABOLIC PANEL
Anion gap: 7 (ref 5–15)
BUN: 19 mg/dL (ref 6–20)
CO2: 28 mmol/L (ref 22–32)
Calcium: 8 mg/dL — ABNORMAL LOW (ref 8.9–10.3)
Chloride: 100 mmol/L — ABNORMAL LOW (ref 101–111)
Creatinine, Ser: 0.99 mg/dL (ref 0.61–1.24)
GFR calc Af Amer: >60 mL/min (ref >60)
GFR calc non Af Amer: >60 mL/min (ref >60)
Glucose, Bld: 222 mg/dL — ABNORMAL HIGH (ref 65–99)
Potassium: 4.3 mmol/L (ref 3.5–5.1)
Sodium: 135 mmol/L (ref 135–145)

## 2015-12-21 LAB — GLUCOSE, CAPILLARY
GLUCOSE-CAPILLARY: 229 mg/dL — AB (ref 65–99)
Glucose-Capillary: 196 mg/dL — ABNORMAL HIGH (ref 65–99)
Glucose-Capillary: 233 mg/dL — ABNORMAL HIGH (ref 65–99)
Glucose-Capillary: 270 mg/dL — ABNORMAL HIGH (ref 65–99)
Glucose-Capillary: 257 mg/dL — ABNORMAL HIGH (ref 65–99)

## 2015-12-21 LAB — CBC
HCT: 28.1 % — ABNORMAL LOW (ref 39.0–52.0)
Hemoglobin: 8.5 g/dL — ABNORMAL LOW (ref 13.0–17.0)
MCH: 24.9 pg — ABNORMAL LOW (ref 26.0–34.0)
MCHC: 30.2 g/dL (ref 30.0–36.0)
MCV: 82.4 fL (ref 78.0–100.0)
Platelets: 215 10*3/uL (ref 150–400)
RBC: 3.41 MIL/uL — ABNORMAL LOW (ref 4.22–5.81)
RDW: 15.6 % — ABNORMAL HIGH (ref 11.5–15.5)
WBC: 16.9 10*3/uL — ABNORMAL HIGH (ref 4.0–10.5)

## 2015-12-21 MED ORDER — METOLAZONE 5 MG PO TABS
5.0000 mg | ORAL_TABLET | Freq: Every day | ORAL | Status: AC
  Administered 2015-12-21 – 2015-12-23 (×3): 5 mg via ORAL
  Filled 2015-12-21 (×3): qty 1

## 2015-12-21 MED ORDER — FUROSEMIDE 10 MG/ML IJ SOLN
40.0000 mg | Freq: Two times a day (BID) | INTRAMUSCULAR | Status: DC
  Administered 2015-12-21: 40 mg via INTRAVENOUS
  Filled 2015-12-21: qty 4

## 2015-12-21 MED ORDER — INSULIN DETEMIR 100 UNIT/ML ~~LOC~~ SOLN
18.0000 [IU] | Freq: Two times a day (BID) | SUBCUTANEOUS | Status: DC
  Administered 2015-12-21: 18 [IU] via SUBCUTANEOUS
  Filled 2015-12-21 (×2): qty 0.18

## 2015-12-21 MED ORDER — POTASSIUM CHLORIDE CRYS ER 20 MEQ PO TBCR
20.0000 meq | EXTENDED_RELEASE_TABLET | Freq: Two times a day (BID) | ORAL | Status: DC
  Administered 2015-12-21 (×2): 20 meq via ORAL
  Filled 2015-12-21 (×2): qty 1

## 2015-12-21 MED ORDER — INSULIN DETEMIR 100 UNIT/ML ~~LOC~~ SOLN
24.0000 [IU] | Freq: Two times a day (BID) | SUBCUTANEOUS | Status: DC
  Administered 2015-12-21: 24 [IU] via SUBCUTANEOUS
  Filled 2015-12-21 (×3): qty 0.24

## 2015-12-21 MED ORDER — ENOXAPARIN SODIUM 40 MG/0.4ML ~~LOC~~ SOLN
40.0000 mg | SUBCUTANEOUS | Status: DC
  Administered 2015-12-21 – 2015-12-24 (×4): 40 mg via SUBCUTANEOUS
  Filled 2015-12-21 (×4): qty 0.4

## 2015-12-21 NOTE — Progress Notes (Signed)
CT surgery p.m. Rounds  Doing well after CABG for ischemic cardiomyopathy Ambulating without difficulty Blood sugars are rising but levemir dose has been adjusted Oxygenation and adequate Urine output increased today

## 2015-12-21 NOTE — Progress Notes (Signed)
2 Days Post-Op Procedure(s) (LRB): coronary artery bypass graft times two  using left internal mammary artery and endoscopic left saphenous vein harvest (Bilateral) TRANSESOPHAGEAL ECHOCARDIOGRAM (TEE) (N/A) Subjective: Progressing well after CABG for ischemic cardiomyopathy Off pressors , sinus rhythm Ambulating in hallway Needs more diuresis, remains oxygen dependent Remains with hyperglycemia, long-acting insulin dose increased Objective: Vital signs in last 24 hours: Temp:  [97.6 F (36.4 C)-98.4 F (36.9 C)] 98.3 F (36.8 C) (09/17 0736) Pulse Rate:  [84-112] 84 (09/17 0900) Cardiac Rhythm: Normal sinus rhythm (09/17 0800) Resp:  [10-21] 10 (09/17 0900) BP: (95-120)/(55-83) 113/62 (09/17 0900) SpO2:  [91 %-100 %] 98 % (09/17 0900) Arterial Line BP: (123-132)/(56-61) 126/56 (09/16 1300) Weight:  [326 lb 1 oz (147.9 kg)] 326 lb 1 oz (147.9 kg) (09/17 0500)  Hemodynamic parameters for last 24 hours: PAP: (37)/(17) 37/17  Intake/Output from previous day: 09/16 0701 - 09/17 0700 In: 580.9 [I.V.:580.9] Out: 1275 [Urine:1275] Intake/Output this shift: Total I/O In: 20 [I.V.:20] Out: 140 [Urine:140]       Exam    General- alert and comfortable   Lungs- clear without rales, wheezes   Cor- regular rate and rhythm, no murmur , gallop   Abdomen- soft, non-tender   Extremities - warm, non-tender, minimal edema   Neuro- oriented, appropriate, no focal weakness   Lab Results:  Recent Labs  12/20/15 1700 12/20/15 1719 12/21/15 0308  WBC 19.0*  --  16.9*  HGB 9.2* 9.5* 8.5*  HCT 30.0* 28.0* 28.1*  PLT 223  --  215   BMET:  Recent Labs  12/20/15 0415  12/20/15 1719 12/21/15 0308  NA 140  --  138 135  K 4.2  --  4.1 4.3  CL 106  --  98* 100*  CO2 27  --   --  28  GLUCOSE 128*  --  192* 222*  BUN 12  --  15 19  CREATININE 0.82  < > 0.90 0.99  CALCIUM 8.2*  --   --  8.0*  < > = values in this interval not displayed.  PT/INR:  Recent Labs  12/19/15 1400   LABPROT 16.3*  INR 1.30   ABG    Component Value Date/Time   PHART 7.306 (L) 12/19/2015 1825   HCO3 24.8 12/19/2015 1825   TCO2 27 12/20/2015 1719   ACIDBASEDEF 2.0 12/19/2015 1825   O2SAT 97.0 12/19/2015 1825   CBG (last 3)   Recent Labs  12/20/15 2310 12/21/15 0305 12/21/15 0733  GLUCAP 228* 196* 233*    Assessment/Plan: S/P Procedure(s) (LRB): coronary artery bypass graft times two  using left internal mammary artery and endoscopic left saphenous vein harvest (Bilateral) TRANSESOPHAGEAL ECHOCARDIOGRAM (TEE) (N/A) Continue diuresis mobilization  increase long-acting insulin dosing and continue sliding scale  LOS: 2 days    Kathlee Nationseter Van Trigt III 12/21/2015

## 2015-12-22 ENCOUNTER — Encounter (HOSPITAL_COMMUNITY): Payer: Self-pay | Admitting: *Deleted

## 2015-12-22 ENCOUNTER — Inpatient Hospital Stay (HOSPITAL_COMMUNITY): Payer: Managed Care, Other (non HMO)

## 2015-12-22 LAB — POCT I-STAT, CHEM 8
BUN: 18 mg/dL (ref 6–20)
BUN: 16 mg/dL (ref 6–20)
BUN: 15 mg/dL (ref 6–20)
BUN: 14 mg/dL (ref 6–20)
BUN: 15 mg/dL (ref 6–20)
CHLORIDE: 100 mmol/L — AB (ref 101–111)
CHLORIDE: 100 mmol/L — AB (ref 101–111)
CREATININE: 0.6 mg/dL — AB (ref 0.61–1.24)
CREATININE: 0.8 mg/dL (ref 0.61–1.24)
Calcium, Ion: 1.18 mmol/L (ref 1.15–1.40)
Calcium, Ion: 1.16 mmol/L (ref 1.15–1.40)
Calcium, Ion: 1.03 mmol/L — ABNORMAL LOW (ref 1.15–1.40)
Calcium, Ion: 1.02 mmol/L — ABNORMAL LOW (ref 1.15–1.40)
Calcium, Ion: 1.19 mmol/L (ref 1.15–1.40)
Chloride: 99 mmol/L — ABNORMAL LOW (ref 101–111)
Chloride: 101 mmol/L (ref 101–111)
Chloride: 100 mmol/L — ABNORMAL LOW (ref 101–111)
Creatinine, Ser: 0.6 mg/dL — ABNORMAL LOW (ref 0.61–1.24)
Creatinine, Ser: 0.5 mg/dL — ABNORMAL LOW (ref 0.61–1.24)
Creatinine, Ser: 0.7 mg/dL (ref 0.61–1.24)
Glucose, Bld: 178 mg/dL — ABNORMAL HIGH (ref 65–99)
Glucose, Bld: 162 mg/dL — ABNORMAL HIGH (ref 65–99)
Glucose, Bld: 140 mg/dL — ABNORMAL HIGH (ref 65–99)
Glucose, Bld: 136 mg/dL — ABNORMAL HIGH (ref 65–99)
Glucose, Bld: 179 mg/dL — ABNORMAL HIGH (ref 65–99)
HEMATOCRIT: 37 % — AB (ref 39.0–52.0)
HEMATOCRIT: 33 % — AB (ref 39.0–52.0)
HEMATOCRIT: 27 % — AB (ref 39.0–52.0)
HEMATOCRIT: 25 % — AB (ref 39.0–52.0)
HEMATOCRIT: 26 % — AB (ref 39.0–52.0)
HEMOGLOBIN: 12.6 g/dL — AB (ref 13.0–17.0)
HEMOGLOBIN: 11.2 g/dL — AB (ref 13.0–17.0)
HEMOGLOBIN: 9.2 g/dL — AB (ref 13.0–17.0)
HEMOGLOBIN: 8.8 g/dL — AB (ref 13.0–17.0)
Hemoglobin: 8.5 g/dL — ABNORMAL LOW (ref 13.0–17.0)
POTASSIUM: 4.2 mmol/L (ref 3.5–5.1)
POTASSIUM: 4.1 mmol/L (ref 3.5–5.1)
POTASSIUM: 4 mmol/L (ref 3.5–5.1)
POTASSIUM: 4 mmol/L (ref 3.5–5.1)
Potassium: 5.2 mmol/L — ABNORMAL HIGH (ref 3.5–5.1)
SODIUM: 140 mmol/L (ref 135–145)
SODIUM: 140 mmol/L (ref 135–145)
SODIUM: 136 mmol/L (ref 135–145)
SODIUM: 138 mmol/L (ref 135–145)
Sodium: 140 mmol/L (ref 135–145)
TCO2: 31 mmol/L (ref 0–100)
TCO2: 29 mmol/L (ref 0–100)
TCO2: 30 mmol/L (ref 0–100)
TCO2: 25 mmol/L (ref 0–100)
TCO2: 28 mmol/L (ref 0–100)

## 2015-12-22 LAB — POCT I-STAT 3, ART BLOOD GAS (G3+)
ACID-BASE EXCESS: 2 mmol/L (ref 0.0–2.0)
Acid-Base Excess: 6 mmol/L — ABNORMAL HIGH (ref 0.0–2.0)
BICARBONATE: 30.2 mmol/L — AB (ref 20.0–28.0)
BICARBONATE: 25.3 mmol/L (ref 20.0–28.0)
Bicarbonate: 29.6 mmol/L — ABNORMAL HIGH (ref 20.0–28.0)
O2 SAT: 98 %
O2 Saturation: 100 %
O2 Saturation: 100 %
PCO2 ART: 45.3 mmHg (ref 32.0–48.0)
PH ART: 7.325 — AB (ref 7.350–7.450)
PH ART: 7.486 — AB (ref 7.350–7.450)
PO2 ART: 411 mmHg — AB (ref 83.0–108.0)
PO2 ART: 439 mmHg — AB (ref 83.0–108.0)
PO2 ART: 107 mmHg (ref 83.0–108.0)
TCO2: 31 mmol/L (ref 0–100)
TCO2: 31 mmol/L (ref 0–100)
TCO2: 27 mmol/L (ref 0–100)
pCO2 arterial: 56.9 mmHg — ABNORMAL HIGH (ref 32.0–48.0)
pCO2 arterial: 40 mmHg (ref 32.0–48.0)
pH, Arterial: 7.355 (ref 7.350–7.450)

## 2015-12-22 LAB — GLUCOSE, CAPILLARY
GLUCOSE-CAPILLARY: 181 mg/dL — AB (ref 65–99)
GLUCOSE-CAPILLARY: 223 mg/dL — AB (ref 65–99)
Glucose-Capillary: 163 mg/dL — ABNORMAL HIGH (ref 65–99)
Glucose-Capillary: 138 mg/dL — ABNORMAL HIGH (ref 65–99)
Glucose-Capillary: 152 mg/dL — ABNORMAL HIGH (ref 65–99)
Glucose-Capillary: 193 mg/dL — ABNORMAL HIGH (ref 65–99)

## 2015-12-22 LAB — BASIC METABOLIC PANEL
Anion gap: 8 (ref 5–15)
BUN: 20 mg/dL (ref 6–20)
CO2: 29 mmol/L (ref 22–32)
Calcium: 8.5 mg/dL — ABNORMAL LOW (ref 8.9–10.3)
Chloride: 96 mmol/L — ABNORMAL LOW (ref 101–111)
Creatinine, Ser: 0.95 mg/dL (ref 0.61–1.24)
GFR calc Af Amer: >60 mL/min (ref >60)
GFR calc non Af Amer: >60 mL/min (ref >60)
Glucose, Bld: 164 mg/dL — ABNORMAL HIGH (ref 65–99)
Potassium: 3.6 mmol/L (ref 3.5–5.1)
Sodium: 133 mmol/L — ABNORMAL LOW (ref 135–145)

## 2015-12-22 LAB — CBC
HEMATOCRIT: 25.4 % — AB (ref 39.0–52.0)
HEMOGLOBIN: 8 g/dL — AB (ref 13.0–17.0)
MCH: 25.8 pg — AB (ref 26.0–34.0)
MCHC: 31.5 g/dL (ref 30.0–36.0)
MCV: 81.9 fL (ref 78.0–100.0)
Platelets: 229 10*3/uL (ref 150–400)
RBC: 3.1 MIL/uL — AB (ref 4.22–5.81)
RDW: 15.6 % — ABNORMAL HIGH (ref 11.5–15.5)
WBC: 14.1 10*3/uL — ABNORMAL HIGH (ref 4.0–10.5)

## 2015-12-22 MED ORDER — POTASSIUM CHLORIDE CRYS ER 20 MEQ PO TBCR: 20.0000 meq | EXTENDED_RELEASE_TABLET | ORAL | Status: DC

## 2015-12-22 MED ORDER — METFORMIN HCL 500 MG PO TABS
1000.0000 mg | ORAL_TABLET | Freq: Two times a day (BID) | ORAL | Status: DC
Start: 2015-12-22 — End: 2015-12-24
  Administered 2015-12-22 – 2015-12-24 (×5): 1000 mg via ORAL
  Filled 2015-12-22 (×5): qty 2

## 2015-12-22 MED ORDER — BISACODYL 5 MG PO TBEC: 10.0000 mg | DELAYED_RELEASE_TABLET | Freq: Every day | ORAL | Status: DC | PRN

## 2015-12-22 MED ORDER — POTASSIUM CHLORIDE CRYS ER 20 MEQ PO TBCR: 20.0000 meq | EXTENDED_RELEASE_TABLET | ORAL | Status: DC | PRN

## 2015-12-22 MED ORDER — BISACODYL 10 MG RE SUPP: 10.0000 mg | Freq: Every day | RECTAL | Status: DC | PRN

## 2015-12-22 MED ORDER — SODIUM CHLORIDE 0.9 % IV SOLN: 250.0000 mL | INTRAVENOUS | Status: DC | PRN

## 2015-12-22 MED ORDER — FUROSEMIDE 40 MG PO TABS
40.0000 mg | ORAL_TABLET | Freq: Every day | ORAL | Status: DC
Start: 2015-12-22 — End: 2015-12-24
  Administered 2015-12-22 – 2015-12-24 (×3): 40 mg via ORAL
  Filled 2015-12-22 (×3): qty 1

## 2015-12-22 MED ORDER — POTASSIUM CHLORIDE CRYS ER 20 MEQ PO TBCR
40.0000 meq | EXTENDED_RELEASE_TABLET | Freq: Two times a day (BID) | ORAL | Status: AC
  Administered 2015-12-22 – 2015-12-23 (×4): 40 meq via ORAL
  Filled 2015-12-22 (×4): qty 2

## 2015-12-22 MED ORDER — PANTOPRAZOLE SODIUM 40 MG PO TBEC
40.0000 mg | DELAYED_RELEASE_TABLET | Freq: Every day | ORAL | Status: DC
  Administered 2015-12-22 – 2015-12-24 (×3): 40 mg via ORAL
  Filled 2015-12-22 (×3): qty 1

## 2015-12-22 MED ORDER — CARVEDILOL 6.25 MG PO TABS
6.2500 mg | ORAL_TABLET | Freq: Two times a day (BID) | ORAL | Status: DC
  Administered 2015-12-22 – 2015-12-24 (×5): 6.25 mg via ORAL
  Filled 2015-12-22 (×5): qty 1

## 2015-12-22 MED ORDER — ONDANSETRON HCL 4 MG/2ML IJ SOLN: 4.0000 mg | Freq: Four times a day (QID) | INTRAMUSCULAR | Status: DC | PRN

## 2015-12-22 MED ORDER — OXYCODONE HCL 5 MG PO TABS
5.0000 mg | ORAL_TABLET | ORAL | Status: DC | PRN
  Administered 2015-12-22 – 2015-12-24 (×6): 10 mg via ORAL
  Filled 2015-12-22 (×6): qty 2

## 2015-12-22 MED ORDER — INSULIN ASPART 100 UNIT/ML ~~LOC~~ SOLN
8.0000 [IU] | Freq: Three times a day (TID) | SUBCUTANEOUS | Status: DC
  Administered 2015-12-22 – 2015-12-24 (×7): 8 [IU] via SUBCUTANEOUS

## 2015-12-22 MED ORDER — MOVING RIGHT ALONG BOOK
Freq: Once | Status: AC
  Administered 2015-12-22: 09:00:00
  Filled 2015-12-22: qty 1

## 2015-12-22 MED ORDER — INSULIN DETEMIR 100 UNIT/ML ~~LOC~~ SOLN
30.0000 [IU] | Freq: Two times a day (BID) | SUBCUTANEOUS | Status: DC
  Administered 2015-12-22 – 2015-12-24 (×5): 30 [IU] via SUBCUTANEOUS
  Filled 2015-12-22 (×7): qty 0.3

## 2015-12-22 MED ORDER — ASPIRIN EC 325 MG PO TBEC
325.0000 mg | DELAYED_RELEASE_TABLET | Freq: Every day | ORAL | Status: DC
  Administered 2015-12-22 – 2015-12-24 (×3): 325 mg via ORAL
  Filled 2015-12-22 (×3): qty 1

## 2015-12-22 MED ORDER — SODIUM CHLORIDE 0.9% FLUSH
3.0000 mL | Freq: Two times a day (BID) | INTRAVENOUS | Status: DC
  Administered 2015-12-22 – 2015-12-23 (×4): 3 mL via INTRAVENOUS

## 2015-12-22 MED ORDER — ONDANSETRON HCL 4 MG PO TABS: 4.0000 mg | ORAL_TABLET | Freq: Four times a day (QID) | ORAL | Status: DC | PRN

## 2015-12-22 MED ORDER — DOCUSATE SODIUM 100 MG PO CAPS
200.0000 mg | ORAL_CAPSULE | Freq: Every day | ORAL | Status: DC
  Administered 2015-12-22 – 2015-12-23 (×2): 200 mg via ORAL
  Filled 2015-12-22 (×3): qty 2

## 2015-12-22 MED ORDER — SODIUM CHLORIDE 0.9% FLUSH: 3.0000 mL | INTRAVENOUS | Status: DC | PRN

## 2015-12-22 MED ORDER — TRAMADOL HCL 50 MG PO TABS: 50.0000 mg | ORAL_TABLET | ORAL | Status: DC | PRN

## 2015-12-22 MED ORDER — CLOPIDOGREL BISULFATE 75 MG PO TABS
75.0000 mg | ORAL_TABLET | Freq: Every day | ORAL | Status: DC
  Administered 2015-12-22 – 2015-12-24 (×3): 75 mg via ORAL
  Filled 2015-12-22 (×3): qty 1

## 2015-12-22 MED ORDER — INSULIN ASPART 100 UNIT/ML ~~LOC~~ SOLN
0.0000 [IU] | Freq: Three times a day (TID) | SUBCUTANEOUS | Status: DC
  Administered 2015-12-22: 2 [IU] via SUBCUTANEOUS
  Administered 2015-12-22: 8 [IU] via SUBCUTANEOUS
  Administered 2015-12-22 – 2015-12-23 (×2): 4 [IU] via SUBCUTANEOUS
  Administered 2015-12-23: 2 [IU] via SUBCUTANEOUS
  Administered 2015-12-23: 8 [IU] via SUBCUTANEOUS
  Administered 2015-12-23: 4 [IU] via SUBCUTANEOUS
  Administered 2015-12-24 (×2): 2 [IU] via SUBCUTANEOUS

## 2015-12-22 NOTE — Progress Notes (Signed)
Pt transferred to 2w10 from 2s. Pt hooked up to tele monitor. Pt c/o 7/10 pain. Pt sitting in recliner. Call bell and phone within reach.

## 2015-12-22 NOTE — Progress Notes (Signed)
Moving Right along booklet given to patient

## 2015-12-22 NOTE — Progress Notes (Signed)
3 Days Post-Op Procedure(s) (LRB): coronary artery bypass graft times two  using left internal mammary artery and endoscopic left saphenous vein harvest (Bilateral) TRANSESOPHAGEAL ECHOCARDIOGRAM (TEE) (N/A) Subjective:  No complaints. Pain under good control  Objective: Vital signs in last 24 hours: Temp:  [97.7 F (36.5 C)-98.6 F (37 C)] 98.6 F (37 C) (09/18 0700) Pulse Rate:  [82-103] 83 (09/18 0700) Cardiac Rhythm: Normal sinus rhythm (09/18 0400) Resp:  [10-19] 11 (09/18 0700) BP: (94-122)/(59-80) 109/65 (09/18 0700) SpO2:  [90 %-100 %] 99 % (09/18 0700) Weight:  [147 kg (324 lb 1.2 oz)] 147 kg (324 lb 1.2 oz) (09/18 0604)  Hemodynamic parameters for last 24 hours:    Intake/Output from previous day: 09/17 0701 - 09/18 0700 In: 520 [P.O.:420; I.V.:100] Out: 2605 [Urine:2605] Intake/Output this shift: No intake/output data recorded.  General appearance: alert and cooperative Heart: regular rate and rhythm, S1, S2 normal, no murmur, click, rub or gallop Lungs: clear to auscultation bilaterally Extremities: edema mild Wound: dressings dry  Lab Results:  Recent Labs  12/21/15 0308 12/22/15 0500  WBC 16.9* 14.1*  HGB 8.5* 8.0*  HCT 28.1* 25.4*  PLT 215 229   BMET:  Recent Labs  12/21/15 0308 12/22/15 0400  NA 135 133*  K 4.3 3.6  CL 100* 96*  CO2 28 29  GLUCOSE 222* 164*  BUN 19 20  CREATININE 0.99 0.95  CALCIUM 8.0* 8.5*    PT/INR:  Recent Labs  12/19/15 1400  LABPROT 16.3*  INR 1.30   ABG    Component Value Date/Time   PHART 7.306 (L) 12/19/2015 1825   HCO3 24.8 12/19/2015 1825   TCO2 27 12/20/2015 1719   ACIDBASEDEF 2.0 12/19/2015 1825   O2SAT 97.0 12/19/2015 1825   CBG (last 3)   Recent Labs  12/22/15 0020 12/22/15 0409 12/22/15 0737  GLUCAP 181* 163* 138*    Assessment/Plan: S/P Procedure(s) (LRB): coronary artery bypass graft times two  using left internal mammary artery and endoscopic left saphenous vein harvest  (Bilateral) TRANSESOPHAGEAL ECHOCARDIOGRAM (TEE) (N/A)  POD 3  He is hemodynamically stable in sinus rhythm.  Will continue Coreg  Volume excess: continue diuresis with lasix. Metolazone added for a few days since he did not diurese much with lasix alone.  DM: glucose coming down with increasing Levemir dose. Will resume Metformin and add meal coverage. Preop Hgb A1c was 8.3. He will need outpt diabetes education.  Transfer to 2W and continue mobilization, IS.    LOS: 3 days    Alleen BorneBryan K Bartle 12/22/2015

## 2015-12-22 NOTE — Progress Notes (Signed)
Report attempted 

## 2015-12-22 NOTE — Progress Notes (Addendum)
Patient placed in wheelchair with 2L O2, SCDs, belonings including cell phone and charger. Patient transported to new room 2W10. 2W RN at bedside upon arrival.

## 2015-12-22 NOTE — Progress Notes (Signed)
Right IJ removed. Pt tolerated procedure well. Will cont to monitor.

## 2015-12-22 NOTE — Progress Notes (Signed)
CARDIAC REHAB PHASE I   PRE:  Rate/Rhythm: 113 ST  BP:  Sitting: 120/70       SaO2: 100% 2.5L  MODE:  Ambulation: 500 ft   POST:  Rate/Rhythm: 115 ST  BP:  Sitting: 141/73         SaO2: 100 2L  Pt stood assist x1 and ambulated with a RW and assist x1.  Pt tolerated walking well.  Pt to chair after walk with call bell in reach.  Will continue to follow.  [id number]  Brett SpecterAshley L Exilda Wilhite, MS 12/22/2015 2:30 PM

## 2015-12-23 ENCOUNTER — Inpatient Hospital Stay (HOSPITAL_COMMUNITY): Payer: Managed Care, Other (non HMO)

## 2015-12-23 LAB — BASIC METABOLIC PANEL
ANION GAP: 8 (ref 5–15)
BUN: 20 mg/dL (ref 6–20)
CALCIUM: 8.4 mg/dL — AB (ref 8.9–10.3)
CO2: 36 mmol/L — AB (ref 22–32)
CREATININE: 1.08 mg/dL (ref 0.61–1.24)
Chloride: 89 mmol/L — ABNORMAL LOW (ref 101–111)
GFR calc Af Amer: >60 mL/min (ref >60)
GFR calc non Af Amer: >60 mL/min (ref >60)
GLUCOSE: 166 mg/dL — AB (ref 65–99)
Potassium: 3.5 mmol/L (ref 3.5–5.1)
Sodium: 133 mmol/L — ABNORMAL LOW (ref 135–145)

## 2015-12-23 LAB — GLUCOSE, CAPILLARY
GLUCOSE-CAPILLARY: 186 mg/dL — AB (ref 65–99)
GLUCOSE-CAPILLARY: 217 mg/dL — AB (ref 65–99)
Glucose-Capillary: 149 mg/dL — ABNORMAL HIGH (ref 65–99)
Glucose-Capillary: 193 mg/dL — ABNORMAL HIGH (ref 65–99)

## 2015-12-23 LAB — CBC
HEMATOCRIT: 25.5 % — AB (ref 39.0–52.0)
Hemoglobin: 7.9 g/dL — ABNORMAL LOW (ref 13.0–17.0)
MCH: 25.1 pg — AB (ref 26.0–34.0)
MCHC: 31 g/dL (ref 30.0–36.0)
MCV: 81 fL (ref 78.0–100.0)
Platelets: 257 10*3/uL (ref 150–400)
RBC: 3.15 MIL/uL — ABNORMAL LOW (ref 4.22–5.81)
RDW: 15.5 % (ref 11.5–15.5)
WBC: 12.6 10*3/uL — ABNORMAL HIGH (ref 4.0–10.5)

## 2015-12-23 MED ORDER — LACTULOSE 10 GM/15ML PO SOLN
30.0000 g | Freq: Once | ORAL | Status: AC
  Administered 2015-12-23: 30 g via ORAL
  Filled 2015-12-23: qty 45

## 2015-12-23 MED ORDER — POTASSIUM CHLORIDE CRYS ER 20 MEQ PO TBCR
40.0000 meq | EXTENDED_RELEASE_TABLET | Freq: Once | ORAL | Status: AC
Start: 2015-12-23 — End: 2015-12-23
  Administered 2015-12-23: 40 meq via ORAL
  Filled 2015-12-23: qty 2

## 2015-12-23 NOTE — Progress Notes (Signed)
Patient with higher heart rate with walking today up to the 130s, however tolerated ambulation ok, patient with also short runs of Vtach on Monitor, (max 6beats) PVCs, and a short burst of SVT patient currently running a heart rate of 102 on monitor. Patient has received all scheduled medicine for this AM. Doree Fudgeonielle Zimmerman [facility] called and made aware stated, to wait till AM to pull Pacing wires. Will continue to monitor patient. Arah Aro, Randall AnKristin Jessup RN

## 2015-12-23 NOTE — Progress Notes (Signed)
CARDIAC REHAB PHASE I   PRE:  Rate/Rhythm: 100 ST    BP: sitting 122/74    SaO2: 100 2L, 100 1L  MODE:  Ambulation: 890 ft   POST:  Rate/Rhythm: 136 ST with PVCs    BP: sitting 134/74     SaO2: 97 RA  Pt without c/o walking, steady without assist. He denied fatigue or SOB however HR increased to 136 ST with PVCs and pt looked paler. To recliner. No O2 needed. Will need to assess O2 needs while sleeping (but pt does have cpap at home). Pt can ambulate independently today. [id number]   Harriet MassonRandi Kristan Blong Busk CES, ACSM 12/23/2015 11:20 AM

## 2015-12-23 NOTE — Progress Notes (Addendum)
      [address]       [place] [id number]             [phone]        4 Days Post-Op Procedure(s) (LRB): coronary artery bypass graft times two  using left internal mammary artery and endoscopic left saphenous vein harvest (Bilateral) TRANSESOPHAGEAL ECHOCARDIOGRAM (TEE) (N/A)  Subjective: Patient passing flatus but no bowel movement yet.  Objective: Vital signs in last 24 hours: Temp:  [98 F (36.7 C)-99.4 F (37.4 C)] 98 F (36.7 C) (09/19 0523) Pulse Rate:  [84-107] 89 (09/19 0523) Cardiac Rhythm: Normal sinus rhythm (09/18 1950) Resp:  [7-21] 18 (09/19 0523) BP: (91-134)/(57-76) 119/57 (09/19 0523) SpO2:  [97 %-100 %] 100 % (09/19 0523) Weight:  [316 lb 14.4 oz (143.7 kg)] 316 lb 14.4 oz (143.7 kg) (09/19 0523)  Pre op weight 142.9 kg Current Weight  12/23/15 (!) 316 lb 14.4 oz (143.7 kg)      Intake/Output from previous day: 09/18 0701 - 09/19 0700 In: 840 [P.O.:840] Out: 3210 [Urine:3210]   Physical Exam:  Cardiovascular: RRR Pulmonary: Diminished throughout  Abdomen: Soft, non tender, bowel sounds present. Extremities: Bilateral lower extremity edema. Wounds: Clean and dry.  No erythema or signs of infection.  Lab Results: CBC: Recent Labs  12/22/15 0500 12/23/15 0218  WBC 14.1* 12.6*  HGB 8.0* 7.9*  HCT 25.4* 25.5*  PLT 229 257   BMET:  Recent Labs  12/22/15 0400 12/23/15 0218  NA 133* 133*  K 3.6 3.5  CL 96* 89*  CO2 29 36*  GLUCOSE 164* 166*  BUN 20 20  CREATININE 0.95 1.08  CALCIUM 8.5* 8.4*    PT/INR:  Lab Results  Component Value Date   INR 1.30 12/19/2015   INR 0.94 12/17/2015   ABG:  INR: Will add last result for INR, ABG once components are confirmed Will add last 4 CBG results once components are confirmed  Assessment/Plan:  1. CV - SR in the 80's. On Coreg 6.25 mg bid, Plavix 75 mg daily 2.  Pulmonary - On 2 liters via [place]. Wean to room air as tolerates. Encourage incentive spirometer. Will  order PA/LAT CXR for today. 3. Volume Overload - On Lasix 40 mg daily and Zaroxolyn 5 mg daily. 4.  Acute blood loss anemia - H and H 7.9 and 25.5 5. Supplement potassium 6. DM-CBGs 152/193/186. On Metformin 1000 mg bid and Insulin. Pre op HGA1C 8.3. Will need follow up with medical doctor after discharge. 7. Remove EPW 8. LOC constipation 9. Possibly, discharge in am ZIMMERMAN,DONIELLE MPA-C 12/23/2015,7:19 AM    Chart reviewed, patient examined, agree with above.

## 2015-12-23 NOTE — Discharge Summary (Signed)
Physician Discharge Summary       [address]       [place] [address]             [phone]    Patient ID: Brett Drake MRN: [id number] DOB/AGE: [birth year] 45 y.o.  Admit date: 12/19/2015 Discharge date: 12/24/2015  Admission Diagnoses: 1. Coronary artery disease 2. Ischemic cardiomyopathy  Active Diagnoses:  1. Diabetes mellitus without complication (HCC) 2. Hyperlipemia 3. Hypertension 4. Morbid obesity (HCC) 5. Depression 6. Anxiety 7. Cardiac defibrillator in place  Procedure (s):  1. Median Sternotomy 2. Extracorporeal circulation 3.   Coronary artery bypass grafting x 2   Left internal mammary graft to the LAD  SVG to Ramus   4.   Endoscopic vein harvest from the left leg by Dr. Laneta Simmers on 12/19/2015.   History of Presenting Illness: The patient is a morbidly obese 45 year old poorly controlled diabetic with hypertension and hyperlipidemia, severe OSA on CPAP, a long history of coronary artery disease s/p PCI and DES x 2 to an occluded RCA 07/2008 after presenting in [place] with an acute IMI. He had an ICD placed in 2013 in [place] for ischemic cardiomyopathy with an EF of 35% and inducible VT. He has not had any chest pain, shortness of breath or orthopnea/PND but was noted on pacemaker transmission to have volume overload and tachycardia. He had a nuclear stress which was felt to be a high risk study with a very large severe defect in the inferior, inferolateral, inferoapical walls consistent with scar with moderate peri-infarct ischemia especially involving the lateral wall. There was also a moderate-sized anterior wall scar from the base towards the apex with very mild peri-infarct ischemia. LVEF was 31% with marked global hypokinesis and inferior akinesis. Cath on 11/28/2015 showed a dilated LV with an LVEF of 35%. The RCA was occluded proximally with faint collaterals from the left to the PDA. The LAD had 70-80% proximal stenosis  with an FFR of 0.77. The LCX has a moderate sized Ramus with 90% ostial stenosis. The remainder of the LCX is small and diffusely diseased. Right heart cath not done. His last echo on 11/06/2015 showed an EF of 50-55% with normal LV cavity size and a mild decrease in global wall motion. There was trace MR.  This 45 year old morbidly obese, poorly controlled diabetic has severe multi-vessel coronary artery disease and ischemic cardiomyopathy with a high risk nuclear stress test. His EF by echo 11/06/2015 was recorded at 50-55% but it looked more like 35-40% by cath. I have personally reviewed and interpreted his cath films. He has a significant proximal LAD stenosis and the distal vessel appears graftable. The Ramus has a tight ostial stenosis and is a moderate-sized graftable vessel. The remainder of the LCX is small caliber and not graftable and at risk for ongoing ischemia. The RCA is occluded proximally and the PDA fills faintly and appears small and diffusely diseased. It may not be graftable. Dr. Laneta Simmers discussed with patient that CABG is probably the best treatment for him although his long term prognosis. Potential risks, benefits, and complications of the surgery were discussed with the patient and he agreed to proceed with surgery. Pre operative carotid duplex US showed no significant internal carotid artery stenosis bilaterally. He underwent a CABG x 2 on 12/19/2015.   Brief Hospital Course:  The patient was extubated the evening of surgery without difficulty. He remained afebrile and hemodynamically stable. He was weaned off Dopamine. Theone Murdoch,  a line, chest tubes, and foley were removed early in the post operative course. Coreg was started and titrated accordingly. He was volume over loaded and diuresed. He had ABL anemia. He did not require a post op transfusion. His last H and H was 7.9 and 25.5. He was weaned off the insulin drip. The patient's HGA1C pre op was 8.3. Once he was tolerating a diet,  home diabetic medicines of Metformin and Insulin were restarted.  The patient was felt surgically stable for transfer from the ICU to PCTU for further convalescence on 12/22/2015. He continues to progress with cardiac rehab. He was ambulating on 2 liters of oxygen via [place]. He was weaned to room air. Hehas been tolerating a diet and has had a bowel movement. Epicardial pacing wires were removed on 12/23/2015. Chest tube sutures will be removed in the office after discharge. The patient is felt surgically stable for discharge today.   Latest Vital Signs: Blood pressure (!) 144/75, pulse 90, temperature 97.8 F (36.6 C), temperature source Oral, resp. rate 20, height 5\' 11"  (1.803 m), weight (!) 309 lb 14.4 oz (140.6 kg), SpO2 100 %.  Physical Exam: Cardiovascular: RRR Pulmonary: Diminished throughout  Abdomen: Soft, non tender, bowel sounds present. Extremities: Bilateral lower extremity edema. Wounds: Clean and dry.  No erythema or signs of infection.   Discharge Condition:Stable and discharged to home.  Recent laboratory studies:  Lab Results  Component Value Date   WBC 12.6 (H) 12/23/2015   HGB 7.9 (L) 12/23/2015   HCT 25.5 (L) 12/23/2015   MCV 81.0 12/23/2015   PLT 257 12/23/2015   Lab Results  Component Value Date   NA 133 (L) 12/23/2015   K 3.5 12/23/2015   CL 89 (L) 12/23/2015   CO2 36 (H) 12/23/2015   CREATININE 1.08 12/23/2015   GLUCOSE 166 (H) 12/23/2015   Diagnostic Studies:  EXAM: CHEST  2 VIEW  COMPARISON:  12/22/2015  FINDINGS: Multiple overlying wires and leads. Prior median sternotomy. Dual lead pacer. Removal of right IJ Cordis sheath. Midline trachea. Cardiomegaly accentuated by AP portable technique. No pleural effusion or pneumothorax. No congestive failure. Clear lungs.  IMPRESSION: No acute cardiopulmonary disease.  Cardiomegaly without congestive failure.   Electronically Signed   By: Jeronimo Greaves M.D.   On: 12/23/2015  09:05    Discharge Medications:   Medication List    STOP taking these medications   losartan 50 MG tablet Commonly known as:  COZAAR     TAKE these medications   aspirin 325 MG tablet Take 325 mg by mouth daily.   carvedilol 12.5 MG tablet Commonly known as:  COREG Take 1 tablet (12.5 mg total) by mouth 2 (two) times daily with a meal. What changed:  medication strength  how much to take   clopidogrel 75 MG tablet Commonly known as:  PLAVIX Take 75 mg by mouth daily.   FLUoxetine 40 MG capsule Commonly known as:  PROZAC Take 40 mg by mouth daily.   furosemide 40 MG tablet Commonly known as:  LASIX Take 40 mg by mouth daily.   HUMALOG KWIKPEN 200 UNIT/ML Sopn Generic drug:  Insulin Lispro Inject 32-40 Units into the skin daily. Take 32 units with breakfast & lunch Take 40 units with dinner Took 10 units of Humalog this a.m., 12/19/2015   lansoprazole 15 MG capsule Commonly known as:  PREVACID Take 15 mg by mouth at bedtime.   metFORMIN 500 MG tablet Commonly known as:  GLUCOPHAGE Take 1-2 tablets (  500-1,000 mg total) by mouth 2 (two) times daily with a meal. Take 500 mg every morning  Take 1000 mg at bedtime   MULTI-VITAMIN PO Take 1 tablet by mouth daily.   oxyCODONE 5 MG immediate release tablet Commonly known as:  Oxy IR/ROXICODONE Take 1 tablet (5 mg total) by mouth every 4 (four) hours as needed for severe pain.   oxymetazoline 0.05 % nasal spray Commonly known as:  AFRIN Place 1 spray into both nostrils 2 (two) times daily as needed for congestion. As needed   potassium chloride SA 20 MEQ tablet Commonly known as:  K-DUR,KLOR-CON Take 1 tablet (20 mEq total) by mouth daily.   rosuvastatin 40 MG tablet Commonly known as:  CRESTOR Take 40 mg by mouth daily.   TRESIBA FLEXTOUCH 200 UNIT/ML Sopn Generic drug:  Insulin Degludec Inject 100 Units into the skin at bedtime.   VITAMIN D PO Take 1 tablet by mouth daily.      The patient has been  discharged on:   1.Beta Blocker:  Yes [ x  ]                              No   [   ]                              If No, reason:  2.Ace Inhibitor/ARB: Yes [   ]                                     No  [x    ]                                     If No, reason:Labile BP  3.Statin:   Yes [ x  ]                  No  [   ]                  If No, reason:  4.Ecasa:  Yes  [  x ]                  No   [   ]                  If No, reason:  Follow Up Appointments: Follow-up Information    Yates DecampGANJI, JAY, MD .   Specialty:  Cardiology Why:  Call for a follow up appointment for 2 weeks Contact information: [address] [place] [address] [phone]        Alleen BorneBryan K Bartle, MD Follow up on 01/21/2016.   Specialty:  Cardiothoracic Surgery Why:  PA[facility]LAT CX[facility] which is in the same building as Dr. Sharee PimpleBartle's office) on 01/21/2016 at 1:00 pm ;Appointment time is at 1:30 pm Contact information: [address] [place] [address] [phone]        Nurse Follow up on 01/01/2016.   Why:  Appointment is for chest tube suture removal only. Appointment time is at 10:00 am Contact information: [address] [place] [address]  [name], [name], NP .   Specialty:  Nurse Practitioner Why:  Call regarding further diabetes management and further surveillance of HGA1C 8.3 Contact information: [address] [place] [address] [phone]           Signed: Doree Fudge MPA-C 12/24/2015, 9:19 AM

## 2015-12-23 NOTE — Discharge Instructions (Signed)
Coronary Artery Bypass Grafting, Care After °Refer to this sheet in the next few weeks. These instructions provide you with information on caring for yourself after your procedure. Your health care provider may also give you more specific instructions. Your treatment has been planned according to current medical practices, but problems sometimes occur. Call your health care provider if you have any problems or questions after your procedure. °WHAT TO EXPECT AFTER THE PROCEDURE °Recovery from surgery will be different for everyone. Some people feel well after 3 or 4 weeks, while for others it takes longer. After your procedure, it is typical to have the following: °· Nausea and a lack of appetite.   °· Constipation. °· Weakness and fatigue.   °· Depression or irritability.   °· Pain or discomfort at your incision site. °HOME CARE INSTRUCTIONS °· Take medicines only as directed by your health care provider. Do not stop taking medicines or start any new medicines without first checking with your health care provider. °· Take your pulse as directed by your health care provider. °· Perform deep breathing as directed by your health care provider. If you were given a device called an incentive spirometer, use it to practice deep breathing several times a day. Support your chest with a pillow or your arms when you take deep breaths or cough. °· Keep incision areas clean, dry, and protected. Remove or change any bandages (dressings) only as directed by your health care provider. You may have skin adhesive strips over the incision areas. Do not take the strips off. They will fall off on their own. °· Check incision areas daily for any swelling, redness, or drainage. °· If incisions were made in your legs, do the following: °¨ Avoid crossing your legs.   °¨ Avoid sitting for long periods of time. Change positions every 30 minutes.   °¨ Elevate your legs when you are sitting. °· Wear compression stockings as directed by your  health care provider. These stockings help keep blood clots from forming in your legs. °· Take showers once your health care provider approves. Until then, only take sponge baths. Pat incisions dry. Do not rub incisions with a washcloth or towel. Do not take baths, swim, or use a hot tub until your health care provider approves. °· Eat foods that are high in fiber, such as raw fruits and vegetables, whole grains, beans, and nuts. Meats should be lean cut. Avoid canned, processed, and fried foods. °· Drink enough fluid to keep your urine clear or pale yellow. °· Weigh yourself every day. This helps identify if you are retaining fluid that may make your heart and lungs work harder. °· Rest and limit activity as directed by your health care provider. You may be instructed to: °¨ Stop any activity at once if you have chest pain, shortness of breath, irregular heartbeats, or dizziness. Get help right away if you have any of these symptoms. °¨ Move around frequently for short periods or take short walks as directed by your health care provider. Increase your activities gradually. You may need physical therapy or cardiac rehabilitation to help strengthen your muscles and build your endurance. °¨ Avoid lifting, pushing, or pulling anything heavier than 10 lb (4.5 kg) for at least 6 weeks after surgery. °· Do not drive until your health care provider approves.  °· Ask your health care provider when you may return to work. °· Ask your health care provider when you may resume sexual activity. °· Keep all follow-up visits as directed by your health care   provider. This is important. °SEEK MEDICAL CARE IF: °· You have swelling, redness, increasing pain, or drainage at the site of an incision. °· You have a fever. °· You have swelling in your ankles or legs. °· You have pain in your legs.   °· You gain 2 or more pounds (0.9 kg) a day. °· You are nauseous or vomit. °· You have diarrhea.  °SEEK IMMEDIATE MEDICAL CARE IF: °· You have  chest pain that goes to your jaw or arms. °· You have shortness of breath.   °· You have a fast or irregular heartbeat.   °· You notice a "clicking" in your breastbone (sternum) when you move.   °· You have numbness or weakness in your arms or legs. °· You feel dizzy or light-headed.   °MAKE SURE YOU: °· Understand these instructions. °· Will watch your condition. °· Will get help right away if you are not doing well or get worse. °  °This information is not intended to replace advice given to you by your health care provider. Make sure you discuss any questions you have with your health care provider. °  °Document Released: 10/09/2004 Document Revised: 04/12/2014 Document Reviewed: 08/29/2012 °Elsevier Interactive Patient Education ©2016 [employer] ° °

## 2015-12-23 NOTE — Progress Notes (Signed)
Pt has home cpap and places self on/off.  Rt will monitor. 

## 2015-12-24 LAB — GLUCOSE, CAPILLARY
Glucose-Capillary: 150 mg/dL — ABNORMAL HIGH (ref 65–99)
Glucose-Capillary: 132 mg/dL — ABNORMAL HIGH (ref 65–99)

## 2015-12-24 MED ORDER — POTASSIUM CHLORIDE CRYS ER 20 MEQ PO TBCR
40.0000 meq | EXTENDED_RELEASE_TABLET | Freq: Once | ORAL | Status: AC
  Administered 2015-12-24: 40 meq via ORAL
  Filled 2015-12-24: qty 2

## 2015-12-24 MED ORDER — CARVEDILOL 12.5 MG PO TABS: 12.5000 mg | ORAL_TABLET | Freq: Two times a day (BID) | ORAL | 1 refills | Status: DC

## 2015-12-24 MED ORDER — POTASSIUM CHLORIDE CRYS ER 20 MEQ PO TBCR
20.0000 meq | EXTENDED_RELEASE_TABLET | Freq: Once | ORAL | 1 refills | Status: DC
Start: 2015-12-24 — End: 2015-12-24

## 2015-12-24 MED ORDER — POTASSIUM CHLORIDE CRYS ER 20 MEQ PO TBCR
20.0000 meq | EXTENDED_RELEASE_TABLET | Freq: Every day | ORAL | 1 refills | Status: DC
Start: 2015-12-24 — End: 2018-04-24

## 2015-12-24 MED ORDER — CARVEDILOL 12.5 MG PO TABS: 12.5000 mg | ORAL_TABLET | Freq: Two times a day (BID) | ORAL | Status: DC

## 2015-12-24 MED ORDER — OXYCODONE HCL 5 MG PO TABS: 5.0000 mg | ORAL_TABLET | ORAL | 0 refills | Status: DC | PRN

## 2015-12-24 NOTE — Progress Notes (Signed)
Reviewed post op ed with pt.  Discussed sternal precautions, importance of diet/ex and DM management.  Pt verbalized understanding.  Discussed CRPII and pt was interested.  Will refer to [place].  Pt was left in recliner with feet elevated and call bell with in reach.   [phone]  [name], MS 11:38 AM 12/24/15

## 2015-12-24 NOTE — Progress Notes (Signed)
EPW were removed this AM by Physician assistant, patient reminded to lie in bed x1 hour bp 121/94 heart rate SR on monitor will monitor patient. Jeanie Mccard, Randall AnKristin Jessup RN

## 2015-12-24 NOTE — Progress Notes (Signed)
Patient given discharge instructions, medication list and paper prescriptions, along with follow up appointments. Will discharge home with family, IV and Telemetry Dcd. Pricella Gaugh, Randall AnKristin Jessup RN

## 2015-12-24 NOTE — Progress Notes (Addendum)
      [address]       [place] [id number]             [phone]        5 Days Post-Op Procedure(s) (LRB): coronary artery bypass graft times two  using left internal mammary artery and endoscopic left saphenous vein harvest (Bilateral) TRANSESOPHAGEAL ECHOCARDIOGRAM (TEE) (N/A)  Subjective: Patient with bowel movement.  Objective: Vital signs in last 24 hours: Temp:  [97.8 F (36.6 C)-98.6 F (37 C)] 97.8 F (36.6 C) (09/20 0800) Pulse Rate:  [90-95] 90 (09/20 0800) Cardiac Rhythm: Normal sinus rhythm (09/20 0829) Resp:  [18-20] 20 (09/20 0800) BP: (114-144)/(56-81) 144/75 (09/20 0800) SpO2:  [85 %-100 %] 100 % (09/20 0800) Weight:  [309 lb 14.4 oz (140.6 kg)] 309 lb 14.4 oz (140.6 kg) (09/20 0437)  Pre op weight 142.9 kg Current Weight  12/24/15 (!) 309 lb 14.4 oz (140.6 kg)      Intake/Output from previous day: 09/19 0701 - 09/20 0700 In: 360 [P.O.:360] Out: 3550 [Urine:3550]   Physical Exam:  Cardiovascular: RRR Pulmonary: Slightly dminished throughout  Abdomen: Soft, non tender, bowel sounds present. Extremities: Bilateral lower extremity edema. Wounds: Clean and dry.  No erythema or signs of infection.  Lab Results: CBC:  Recent Labs  12/22/15 0500 12/23/15 0218  WBC 14.1* 12.6*  HGB 8.0* 7.9*  HCT 25.4* 25.5*  PLT 229 257   BMET:   Recent Labs  12/22/15 0400 12/23/15 0218  NA 133* 133*  K 3.6 3.5  CL 96* 89*  CO2 29 36*  GLUCOSE 164* 166*  BUN 20 20  CREATININE 0.95 1.08  CALCIUM 8.5* 8.4*    PT/INR:  Lab Results  Component Value Date   INR 1.30 12/19/2015   INR 0.94 12/17/2015   ABG:  INR: Will add last result for INR, ABG once components are confirmed Will add last 4 CBG results once components are confirmed  Assessment/Plan:  1. CV - Previous ST, NSVT yesterday. SR in the 90's. On Coreg 6.25 mg bid, Plavix 75 mg daily. Will increase Coreg to 12.5 mg bid (was on 37.5 mg bid prior to surgery). 2.   Pulmonary - Wean to room air. Encourage incentive spirometer.  3. Volume Overload - On Lasix 40 mg daily and Zaroxolyn 5 mg daily. Will discharge on Lasix 40 mg daily at discharge. 4.  Acute blood loss anemia - Last H and H 7.9 and 25.5 5. Supplement potassium 6. DM-CBGs 149/193/150. On Metformin 1000 mg bid and Insulin. Pre op HGA1C 8.3. Will need follow up with medical doctor after discharge. 7. Remove EPW 8. Discharge in am  ZIMMERMAN,DONIELLE MPA-C 12/24/2015,8:58 AM     Chart reviewed, patient examined, agree with above. He is doing well and had large diuresis yesterday. He is 6 lbs below preop which I suspect was higher than his dry weight. Will continue lasix 40 mg daily.  Bowels working, walking well. Plan home today.

## 2015-12-24 NOTE — Care Management Note (Signed)
Case Management Note Donn PieriniKristi Zaya Kessenich RN, BSN Unit 2W-Case Manager [phone]  Patient Details  Name: Brett CoffinChristopher Drake MRN: [id number] Date of Birth: 06/13/1970  Subjective/Objective:   Pt admitted s/p CABG- tx from ICU to 2W on 12/22/15                 Action/Plan: PTA pt lived at home - anticipate return home- no needs noted   Expected Discharge Date:   12/24/15               Expected Discharge Plan:  Home/Self Care  In-House Referral:     Discharge planning Services  CM Consult  Post Acute Care Choice:    Choice offered to:     DME Arranged:    DME Agency:     HH Arranged:    HH Agency:     Status of Service:  Completed, signed off  If discussed at [employer] Length of Stay Meetings, dates discussed:    Additional Comments:  Darrold SpanWebster, Brett Suire Hall, RN 12/24/2015, 10:39 AM

## 2016-01-01 ENCOUNTER — Encounter (INDEPENDENT_AMBULATORY_CARE_PROVIDER_SITE_OTHER): Payer: Self-pay

## 2016-01-01 DIAGNOSIS — Z4802 Encounter for removal of sutures: Secondary | ICD-10-CM

## 2016-01-01 DIAGNOSIS — Z951 Presence of aortocoronary bypass graft: Secondary | ICD-10-CM

## 2016-01-20 ENCOUNTER — Other Ambulatory Visit: Payer: Self-pay | Admitting: Surgery

## 2016-01-20 DIAGNOSIS — Z951 Presence of aortocoronary bypass graft: Secondary | ICD-10-CM

## 2016-01-21 ENCOUNTER — Ambulatory Visit
Admission: RE | Admit: 2016-01-21 | Discharge: 2016-01-21 | Disposition: A | Discharge status: Home / Self Care | Payer: Managed Care, Other (non HMO) | Source: Ambulatory Visit | Attending: Surgery | Admitting: Surgery

## 2016-01-21 ENCOUNTER — Ambulatory Visit (INDEPENDENT_AMBULATORY_CARE_PROVIDER_SITE_OTHER): Payer: Self-pay | Admitting: Surgery

## 2016-01-21 ENCOUNTER — Encounter: Payer: Self-pay | Admitting: Surgery

## 2016-01-21 VITALS — BP 113/76 | HR 90 | Resp 20 | Ht 71.0 in | Wt 303.0 lb

## 2016-01-21 DIAGNOSIS — Z951 Presence of aortocoronary bypass graft: Secondary | ICD-10-CM

## 2016-01-21 DIAGNOSIS — I2510 Atherosclerotic heart disease of native coronary artery without angina pectoris: Secondary | ICD-10-CM

## 2016-01-22 ENCOUNTER — Encounter: Payer: Self-pay | Admitting: Surgery

## 2016-01-22 NOTE — Progress Notes (Signed)
HPI: Patient returns for routine postoperative follow-up having undergone CABG x 2 on 12/19/2015. The patient's early postoperative recovery while in the hospital was notable for an uncomplicated postop course. Since hospital discharge the patient reports that he has been feeling well and walking without chest pain or shortness of breath. He has been watching his diet and loosing weight. He says that he had his Hgb A1c checked since discharge and it was 6.5. It was 8.3 on admission.    Current Outpatient Prescriptions  Medication Sig Dispense Refill  . aspirin 325 MG tablet Take 325 mg by mouth daily.    . carvedilol (COREG) 12.5 MG tablet Take 1 tablet (12.5 mg total) by mouth 2 (two) times daily with a meal. 60 tablet 1  . Cholecalciferol (VITAMIN D PO) Take 1 tablet by mouth daily.    . clopidogrel (PLAVIX) 75 MG tablet Take 75 mg by mouth daily.    Marland Kitchen FLUoxetine (PROZAC) 40 MG capsule Take 40 mg by mouth daily.    . furosemide (LASIX) 40 MG tablet Take 40 mg by mouth daily.   11  . HUMALOG KWIKPEN 200 UNIT/ML SOPN Inject 32-40 Units into the skin daily. Take 32 units with breakfast & lunch Take 40 units with dinner Took 10 units of Humalog this a.m., 12/19/2015    . Insulin Degludec (TRESIBA FLEXTOUCH) 200 UNIT/ML SOPN Inject 100 Units into the skin at bedtime.    . lansoprazole (PREVACID) 15 MG capsule Take 15 mg by mouth at bedtime.     . metFORMIN (GLUCOPHAGE) 500 MG tablet Take 1-2 tablets (500-1,000 mg total) by mouth 2 (two) times daily with a meal. Take 500 mg every morning  Take 1000 mg at bedtime    . Multiple Vitamin (MULTI-VITAMIN PO) Take 1 tablet by mouth daily.    Marland Kitchen oxyCODONE (OXY IR/ROXICODONE) 5 MG immediate release tablet Take 1 tablet (5 mg total) by mouth every 4 (four) hours as needed for severe pain. 30 tablet 0  . oxymetazoline (AFRIN) 0.05 % nasal spray Place 1 spray into both nostrils 2 (two) times daily as needed for congestion. As needed    . potassium  chloride SA (K-DUR,KLOR-CON) 20 MEQ tablet Take 1 tablet (20 mEq total) by mouth daily. 30 tablet 1  . rosuvastatin (CRESTOR) 40 MG tablet Take 40 mg by mouth daily.     No current facility-administered medications for this visit.     Physical Exam: BP 113/76 (BP Location: Right Arm, Patient Position: Sitting, Cuff Size: Large)   Pulse 90   Resp 20   Ht 5\' 11"  (1.803 m)   Wt (!) 303 lb (137.4 kg)   SpO2 98% Comment: RA  BMI 42.26 kg/m  He looks well. Lung exam is clear. Cardiac exam shows a regular rate and rhythm with normal heart sounds. Chest incision is healing well and sternum is stable. The leg incisions are healing well and there is no peripheral edema.   Diagnostic Tests:  CLINICAL DATA:  45 year old male status post CABG 4 weeks ago. Initial encounter.  EXAM: CHEST  2 VIEW  COMPARISON:  12/23/2015 and earlier.  FINDINGS: Normal lung volumes. The lungs are clear. No pneumothorax or effusion. Sequelae of CABG. Left chest pacemaker is stable. Mediastinal contours are within normal limits. Visualized tracheal air column is within normal limits. No acute osseous abnormality identified.  IMPRESSION: Negative.  No acute cardiopulmonary abnormality.   Electronically Signed   By: Odessa Fleming M.D.   On:  01/21/2016 13:22  Impression:  Overall I think he is doing well. I encouraged him to continue walking. He is planning to participate in cardiac rehab. I told him he could drive his car but should not lift anything heavier than 10 lbs for three months postop.   Plan:  He will continue to follow up with Dr. Jacinto HalimGanji and will contact me if he has any problems with his incisions.    Alleen BorneBryan K Chery Giusto, MD [facility] [phone]

## 2016-01-29 ENCOUNTER — Encounter (HOSPITAL_COMMUNITY)
Admission: RE | Admit: 2016-01-29 | Discharge: 2016-01-29 | Disposition: A | Discharge status: Home / Self Care | Payer: Managed Care, Other (non HMO) | Source: Ambulatory Visit | Attending: Cardiology | Admitting: Cardiology

## 2016-01-29 ENCOUNTER — Encounter (HOSPITAL_COMMUNITY): Payer: Self-pay

## 2016-01-29 VITALS — BP 102/70 | HR 74 | Ht 71.0 in | Wt 303.4 lb

## 2016-01-29 DIAGNOSIS — Z951 Presence of aortocoronary bypass graft: Secondary | ICD-10-CM | POA: Insufficient documentation

## 2016-01-29 NOTE — Progress Notes (Signed)
Cardiac Rehab Medication Review by a Pharmacist  Does the patient  feel that his/her medications are working for him/her?  yes  Has the patient been experiencing any side effects to the medications prescribed?  no  Does the patient measure his/her own blood pressure or blood glucose at home?  yes   Does the patient have any problems obtaining medications due to transportation or finances?   no  Understanding of regimen: excellent Understanding of indications: good Potential of compliance: excellent    Pharmacist comments: Pt reports understanding his medications well. He does not use a pill box or alarm reminders, but denies missing any doses and knows what time to take each medication. He checks his blood sugar TID but does not check his blood pressure at home. Recommended checking it at the pharmacy or buying a cuff to check at home. Patient verbalized understanding.   Mackie Paienee Mickell Birdwell, PharmD PGY1 Pharmacy Resident Pager: [phone] 01/29/2016 8:15 AM

## 2016-01-29 NOTE — Progress Notes (Signed)
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  aspirin 325 MG tablet, Take 325 mg by mouth daily., Disp: , Rfl:  .  carvedilol (COREG) 12.5 MG tablet, Take 1 tablet (12.5 mg total) by mouth 2 (two) times daily with a meal. (Patient taking differently: Take 25 mg by mouth 2 (two) times daily with a meal. ), Disp: 60 tablet, Rfl: 1 .  clopidogrel (PLAVIX) 75 MG tablet, Take 75 mg by mouth daily., Disp: , Rfl:  .  FLUoxetine (PROZAC) 40 MG capsule, Take 40 mg by mouth daily., Disp: , Rfl:  .  furosemide (LASIX) 40 MG tablet, Take 40 mg by mouth daily. , Disp: , Rfl: 11 .  HUMALOG KWIKPEN 200 UNIT/ML SOPN, Inject 25-30 Units into the skin 3 (three) times daily before meals. Take 25 units with breakfast & lunch Take 30 units with dinner, Disp: , Rfl:  .  Insulin Degludec (TRESIBA FLEXTOUCH) 200 UNIT/ML SOPN, Inject 80 Units into the skin at bedtime. , Disp: , Rfl:  .  lansoprazole (PREVACID) 15 MG capsule, Take 15 mg by mouth at bedtime. , Disp: , Rfl:  .  metFORMIN (GLUCOPHAGE) 500 MG tablet, Take 1-2 tablets (500-1,000 mg total) by mouth 2 (two) times daily with a meal. Take 500 mg every morning  Take 1000 mg at bedtime, Disp: , Rfl:  .  Multiple Vitamin (MULTI-VITAMIN PO), Take 1 tablet by mouth daily., Disp: , Rfl:  .  oxymetazoline (AFRIN) 0.05 % nasal spray, Place 1 spray into both nostrils 2 (two) times daily. , Disp: ,  Rfl:  .  potassium chloride SA (K-DUR,KLOR-CON) 20 MEQ tablet, Take 1 tablet (20 mEq total) by mouth daily., Disp: 30 tablet, Rfl: 1 .  rosuvastatin (CRESTOR) 40 MG tablet, Take 40 mg by mouth daily., Disp: , Rfl:   Past Medical History: Past Medical History:  Diagnosis Date  . AICD (automatic cardioverter/defibrillator) present   . Anxiety   . Atherosclerosis   . Cardiac defibrillator in place   . CHF (congestive heart failure) (HCC)   . Coronary artery disease   . Depression   . 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 GERD (gastroesophageal reflux disease)   . Headache   . Heart failure (HCC)    chronic combined systolic and diastolic  . History of bronchitis   . History of pneumonia 2016  . Hyperlipemia   . Hypertension    essential  . Ischemic cardiomyopathy   . Morbid obesity (HCC)   . Myocardial infarction 07/07/2008  . Presence of permanent cardiac pacemaker   . Sleep apnea   . Urinary frequency    "due to medication"  . Wears glasses     Tobacco Use: History  Smoking Status  . Former Smoker  . Quit date: 07/07/2008  Smokeless Tobacco  . Never Used    Comment: quit in 2010    Labs: Recent  Review Flowsheet Data    Labs for ITP Cardiac and Pulmonary Rehab Latest Ref Rng & Units 12/19/2015 12/19/2015 12/19/2015 12/19/2015 12/20/2015   Hemoglobin A1c 4.8 - 5.6 % - - - - -   PHART 7.350 - 7.450 7.332(L) 7.341(L) 7.306(L) - -   PCO2ART 32.0 - 48.0 mmHg 46.1 47.8 49.4(H) - -   HCO3 20.0 - 28.0 mmol/L 24.5 25.9 24.8 - -   TCO2 0 - 100 mmol/L 26 27 26 26 27    ACIDBASEDEF 0.0 - 2.0 mmol/L 2.0 - 2.0 - -   O2SAT % 91.0 98.0 97.0 - -      Capillary Blood Glucose: Lab Results  Component Value Date   GLUCAP 132 (H) 12/24/2015   GLUCAP 150 (H) 12/24/2015   GLUCAP 193 (H) 12/23/2015   GLUCAP 149 (H) 12/23/2015   GLUCAP 217 (H) 12/23/2015     Exercise Target Goals: Date: 01/29/16  Exercise Program  Goal: Individual exercise prescription set with THRR, safety & activity barriers. Participant demonstrates ability to understand and report RPE using BORG scale, to self-measure pulse accurately, and to acknowledge the importance of the exercise prescription.  Exercise Prescription Goal: Starting with aerobic activity 30 plus minutes a day, 3 days per week for initial exercise prescription. Provide home exercise prescription and guidelines that participant acknowledges understanding prior to discharge.  Activity Barriers & Risk Stratification:     Activity Barriers & Cardiac Risk Stratification - 01/29/16 0856      Activity Barriers & Cardiac Risk Stratification   Activity Barriers None   Cardiac Risk Stratification High      6 Minute Walk:     6 Minute Walk    Row Name 01/29/16 1437         6 Minute Walk   Phase Initial     Distance 1595 feet     Walk Time 6 minutes     MPH 3.02     METS 4.01     RPE 6     VO2 Peak 14.01     Symptoms No     Resting HR 74 bpm     Resting BP 102/70     Max Ex. HR 122 bpm     Max Ex. BP 122/60     2 Minute Post BP 120/70        Initial Exercise Prescription:     Initial Exercise Prescription - 01/29/16 1400      Date of Initial Exercise RX and Referring Provider   Date 01/29/16   Referring Provider Delrae Rend MD     Treadmill   MPH 2.9   Grade 1   Minutes 10   METs 3.62     Bike   Level 1   Minutes 10   METs 2.3     NuStep   Level 2   Minutes 10   METs 2     Prescription Details   Frequency (times per week) 3   Duration Progress to 45 minutes of aerobic exercise without signs/symptoms of physical distress     Intensity   THRR 40-80% of Max Heartrate 70-140   Ratings of Perceived Exertion 11-13   Perceived Dyspnea 0-4     Progression   Progression Continue to progress workloads to maintain intensity without signs/symptoms of physical distress.     Resistance Training   Training Prescription Yes    Weight 2lb   Reps 10-12      Perform Capillary Blood Glucose checks as needed.  Exercise Prescription Changes:   Exercise Comments:   Discharge Exercise Prescription (Final Exercise Prescription Changes):   Nutrition:  Target Goals: Understanding of nutrition guidelines, daily intake of sodium 1500mg , cholesterol 200mg , calories 30% from fat and 7% or less from saturated fats, daily to have 5 or more servings of fruits and vegetables.  Biometrics:     Pre Biometrics - 01/29/16 1439      Pre Biometrics   Waist Circumference 56.75 inches   Hip Circumference 53 inches   Waist to Hip Ratio 1.07 %   Triceps Skinfold 30 mm   % Body Fat 42.7 %   Grip Strength 53 kg   Flexibility 11.5 in   Single Leg Stand 30 seconds       Nutrition Therapy Plan and Nutrition Goals:     Nutrition Therapy & Goals - 01/29/16 1413      Nutrition Therapy   Diet Carb Modified, Therapeutic Lifestyle Change     Personal Nutrition Goals   Personal Goal #1 1-2 lb wt loss/week to a wt loss goal of 6-24 lb at graduation from Cardiac Rehab     Intervention Plan   Intervention Prescribe, educate and counsel regarding individualized specific dietary modifications aiming towards targeted core components such as weight, hypertension, lipid management, diabetes, heart failure and other comorbidities.   Expected Outcomes Short Term Goal: Understand basic principles of dietary content, such as calories, fat, sodium, cholesterol and nutrients.;Long Term Goal: Adherence to prescribed nutrition plan.      Nutrition Discharge: Nutrition Scores:     Nutrition Assessments - 01/29/16 1413      MEDFICTS Scores   Pre Score 21      Nutrition Goals Re-Evaluation:   Psychosocial: Target Goals: Acknowledge presence or absence of depression, maximize coping skills, provide positive support system. Participant is able to verbalize types and ability to use techniques and skills needed for reducing stress  and depression.  Initial Review & Psychosocial Screening:     Initial Psych Review & Screening - 01/29/16 1644      Family Dynamics   Good Support System? Yes     Barriers   Psychosocial barriers to participate in program There are no identifiable barriers or psychosocial needs.      Quality of Life Scores:     Quality of Life - 01/29/16 1439      Quality of Life Scores   Health/Function Pre 17.6 %   Socioeconomic Pre 24.5 %   Psych/Spiritual Pre 18.43 %   Family Pre 24 %   GLOBAL Pre 20.13 %      PHQ-9: Recent Review Flowsheet Data    Depression screen PHQ 2/9 01/29/2016   Decreased Interest 0   Down, Depressed, Hopeless 0   PHQ - 2 Score 0      Psychosocial Evaluation and Intervention:   Psychosocial Re-Evaluation:   Vocational Rehabilitation: Provide vocational rehab assistance to qualifying candidates.   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      Education: Education Goals: Education classes will be provided on a weekly basis, covering required topics. Participant will state understanding/return demonstration of topics presented.  Learning Barriers/Preferences:     Learning Barriers/Preferences - 01/29/16 0856      Learning Barriers/Preferences  Learning Barriers Sight   Learning Preferences Skilled Demonstration      Education Topics: Count Your Pulse:  -Group instruction provided by verbal instruction, demonstration, patient participation and written materials to support subject.  Instructors address importance of being able to find your pulse and how to count your pulse when at home without a heart monitor.  Patients get hands on experience counting their pulse with staff help and individually.   Heart  Attack, Angina, and Risk Factor Modification:  -Group instruction provided by verbal instruction, video, and written materials to support subject.  Instructors address signs and symptoms of angina and heart attacks.    Also discuss risk factors for heart disease and how to make changes to improve heart health risk factors.   Functional Fitness:  -Group instruction provided by verbal instruction, demonstration, patient participation, and written materials to support subject.  Instructors address safety measures for doing things around the house.  Discuss how to get up and down off the floor, how to pick things up properly, how to safely get out of a chair without assistance, and balance training.   Meditation and Mindfulness:  -Group instruction provided by verbal instruction, patient participation, and written materials to support subject.  Instructor addresses importance of mindfulness and meditation practice to help reduce stress and improve awareness.  Instructor also leads participants through a meditation exercise.    Stretching for Flexibility and Mobility:  -Group instruction provided by verbal instruction, patient participation, and written materials to support subject.  Instructors lead participants through series of stretches that are designed to increase flexibility thus improving mobility.  These stretches are additional exercise for major muscle groups that are typically performed during regular warm up and cool down.   Hands Only CPR Anytime:  -Group instruction provided by verbal instruction, video, patient participation and written materials to support subject.  Instructors co-teach with AHA video for hands only CPR.  Participants get hands on experience with mannequins.   Nutrition I class: Heart Healthy Eating:  -Group instruction provided by PowerPoint slides, verbal discussion, and written materials to support subject matter. The instructor gives an explanation and review of  the Therapeutic Lifestyle Changes diet recommendations, which includes a discussion on lipid goals, dietary fat, sodium, fiber, plant stanol/sterol esters, sugar, and the components of a well-balanced, healthy diet.   Nutrition II class: Lifestyle Skills:  -Group instruction provided by PowerPoint slides, verbal discussion, and written materials to support subject matter. The instructor gives an explanation and review of label reading, grocery shopping for heart health, heart healthy recipe modifications, and ways to make healthier choices when eating out.   Diabetes Question & Answer:  -Group instruction provided by PowerPoint slides, verbal discussion, and written materials to support subject matter. The instructor gives an explanation and review of diabetes co-morbidities, pre- and post-prandial blood glucose goals, pre-exercise blood glucose goals, signs, symptoms, and treatment of hypoglycemia and hyperglycemia, and foot care basics.   Diabetes Blitz:  -Group instruction provided by PowerPoint slides, verbal discussion, and written materials to support subject matter. The instructor gives an explanation and review of the physiology behind type 1 and type 2 diabetes, diabetes medications and rational behind using different medications, pre- and post-prandial blood glucose recommendations and Hemoglobin A1c goals, diabetes diet, and exercise including blood glucose guidelines for exercising safely.    Portion Distortion:  -Group instruction provided by PowerPoint slides, verbal discussion, written materials, and food models to support subject matter. The instructor gives an explanation of serving size  versus portion size, changes in portions sizes over the last 20 years, and what consists of a serving from each food group.   Stress Management:  -Group instruction provided by verbal instruction, video, and written materials to support subject matter.  Instructors review role of stress in heart  disease and how to cope with stress positively.     Exercising on Your Own:  -Group instruction provided by verbal instruction, power point, and written materials to support subject.  Instructors discuss benefits of exercise, components of exercise, frequency and intensity of exercise, and end points for exercise.  Also discuss use of nitroglycerin and activating EMS.  Review options of places to exercise outside of rehab.  Review guidelines for sex with heart disease.   Cardiac Drugs I:  -Group instruction provided by verbal instruction and written materials to support subject.  Instructor reviews cardiac drug classes: antiplatelets, anticoagulants, beta blockers, and statins.  Instructor discusses reasons, side effects, and lifestyle considerations for each drug class.   Cardiac Drugs II:  -Group instruction provided by verbal instruction and written materials to support subject.  Instructor reviews cardiac drug classes: angiotensin converting enzyme inhibitors (ACE-I), angiotensin II receptor blockers (ARBs), nitrates, and calcium channel blockers.  Instructor discusses reasons, side effects, and lifestyle considerations for each drug class.   Anatomy and Physiology of the Circulatory System:  -Group instruction provided by verbal instruction, video, and written materials to support subject.  Reviews functional anatomy of heart, how it relates to various diagnoses, and what role the heart plays in the overall system.   Knowledge Questionnaire Score:     Knowledge Questionnaire Score - 01/29/16 1437      Knowledge Questionnaire Score   Pre Score 23/24      Core Components/Risk Factors/Patient Goals at Admission:     Personal Goals and Risk Factors at Admission - 01/29/16 0858      Core Components/Risk Factors/Patient Goals on Admission    Weight Management Yes;Obesity   Intervention Weight Management: Develop a combined nutrition and exercise program designed to reach desired  caloric intake, while maintaining appropriate intake of nutrient and fiber, sodium and fats, and appropriate energy expenditure required for the weight goal.   Expected Outcomes Short Term: Continue to assess and modify interventions until short term weight is achieved;Weight Maintenance: Understanding of the daily nutrition guidelines, which includes 25-35% calories from fat, 7% or less cal from saturated fats, less than 200mg  cholesterol, less than 1.5gm of sodium, & 5 or more servings of fruits and vegetables daily;Weight Loss: Understanding of general recommendations for a balanced deficit meal plan, which promotes 1-2 lb weight loss per week and includes a negative energy balance of [id number] kcal/d;Understanding recommendations for meals to include 15-35% energy as protein, 25-35% energy from fat, 35-60% energy from carbohydrates, less than 200mg  of dietary cholesterol, 20-35 gm of total fiber daily;Understanding of distribution of calorie intake throughout the day with the consumption of 4-5 meals/snacks;Weight Gain: Understanding of general recommendations for a high calorie, high protein meal plan that promotes weight gain by distributing calorie intake throughout the day with the consumption for 4-5 meals, snacks, and/or supplements   Increase Strength and Stamina Yes   Intervention Develop an individualized exercise prescription for aerobic and resistive training based on initial evaluation findings, risk stratification, comorbidities and participant's personal goals.;Provide advice, education, support and counseling about physical activity/exercise needs.   Expected Outcomes Achievement of increased cardiorespiratory fitness and enhanced flexibility, muscular endurance and strength shown through measurements of functional capacity  and personal statement of participant.   Diabetes Yes   Intervention Provide education about signs/symptoms and action to take for hypo/hyperglycemia.;Provide education  about proper nutrition, including hydration, and aerobic/resistive exercise prescription along with prescribed medications to achieve blood glucose in normal ranges: Fasting glucose 65-99 mg/dL   Expected Outcomes Short Term: Participant verbalizes understanding of the signs/symptoms and immediate care of hyper/hypoglycemia, proper foot care and importance of medication, aerobic/resistive exercise and nutrition plan for blood glucose control.;Long Term: Attainment of HbA1C < 7%.   Hypertension Yes   Intervention Provide education on lifestyle modifcations including regular physical activity/exercise, weight management, moderate sodium restriction and increased consumption of fresh fruit, vegetables, and low fat dairy, alcohol moderation, and smoking cessation.;Monitor prescription use compliance.   Expected Outcomes Short Term: Continued assessment and intervention until BP is < 140/10590mm HG in hypertensive participants. < 130/7480mm HG in hypertensive participants with diabetes, heart failure or chronic kidney disease.;Long Term: Maintenance of blood pressure at goal levels.   Lipids Yes   Intervention Provide education and support for participant on nutrition & aerobic/resistive exercise along with prescribed medications to achieve LDL 70mg , HDL >40mg .   Expected Outcomes Long Term: Cholesterol controlled with medications as prescribed, with individualized exercise RX and with personalized nutrition plan. Value goals: LDL < 70mg , HDL > 40 mg.   Personal Goal Other Yes   Personal Goal weight loss, improve stamina and overall quality of life    Intervention individualized exercise program, education and nutritional consult   Expected Outcomes increase overal functional capacity and weight loss      Core Components/Risk Factors/Patient Goals Review:      Goals and Risk Factor Review    Row Name 01/29/16 0903             Core Components/Risk Factors/Patient Goals Review   Personal Goals Review  Weight Management/Obesity;Other;Increase Strength and Stamina       Review increase stamina and lose weight       Expected Outcomes increase overall functional capacity and improve quality of life          Core Components/Risk Factors/Patient Goals at Discharge (Final Review):      Goals and Risk Factor Review - 01/29/16 0903      Core Components/Risk Factors/Patient Goals Review   Personal Goals Review Weight Management/Obesity;Other;Increase Strength and Stamina   Review increase stamina and lose weight   Expected Outcomes increase overall functional capacity and improve quality of life      ITP Comments:     ITP Comments    Row Name 01/29/16 0858 01/29/16 1503         ITP Comments Dr. Marland McalpineSheikh Dr. Armanda Magicraci Turner         Comments:  Pt in today for phase II cardiac rehab orientation from 0800-1030.  As a part of the orientation assessment, pt completed warm up stretches and 6 minute walk test. Pt tolerated well with no complaints of cp or sob.  Mild elevation in bp during the walk test which resolved to wnl post walk.  Monitor showed Sr with no ectopy.  Brief psychosocial assessment reveals no barriers to exercise, no needs identified. Pt is looking forward to participating in rehab next week. Alanson Alyarlette Jimya Ciani RN, BSN

## 2016-02-02 ENCOUNTER — Encounter (HOSPITAL_COMMUNITY)
Admission: RE | Admit: 2016-02-02 | Discharge: 2016-02-02 | Disposition: A | Discharge status: Home / Self Care | Payer: Managed Care, Other (non HMO) | Source: Ambulatory Visit | Attending: Cardiology | Admitting: Cardiology

## 2016-02-02 ENCOUNTER — Encounter (HOSPITAL_COMMUNITY): Payer: Self-pay

## 2016-02-02 DIAGNOSIS — Z951 Presence of aortocoronary bypass graft: Secondary | ICD-10-CM | POA: Diagnosis not present

## 2016-02-02 LAB — GLUCOSE, CAPILLARY
GLUCOSE-CAPILLARY: 165 mg/dL — AB (ref 65–99)
GLUCOSE-CAPILLARY: 146 mg/dL — AB (ref 65–99)

## 2016-02-02 NOTE — Progress Notes (Signed)
Daily Session Note  Patient Details  Name: Brett Drake MRN: [id number] Date of Birth: 10/13/1970 Referring Provider:   Flowsheet Row CARDIAC REHAB PHASE II ORIENTATION from 01/29/2016 in [place]  Referring Provider  Kela Millin MD      Encounter Date: 02/02/2016  Check In:     Session Check In - 02/02/16 0913      Check-In   Location [facility]   Staff Present Cleda Mccreedy, MS, Exercise Physiologist;Olinty Celesta Aver, MS, ACSM CEP, Exercise Physiologist;Molly diVincenzo, MS, ACSM RCEP, Exercise Physiologist;Amber Fair, MS, ACSM RCEP, Exercise Physiologist;Joann Rion, RN, BSN   Supervising physician immediately available to respond to emergencies [facility] immediately available   Physician(s) Dr. Alfredia Ferguson   Medication changes reported     No   Fall or balance concerns reported    No   Warm-up and Cool-down Performed as group-led instruction   Resistance Training Performed Yes   VAD Patient? No     Pain Assessment   Currently in Pain? No/denies   Multiple Pain Sites No      Capillary Blood Glucose: Results for orders placed or performed during the hospital encounter of 02/02/16 (from the past 24 hour(s))  Glucose, capillary     Status: Abnormal   Collection Time: 02/02/16  8:26 AM  Result Value Ref Range   Glucose-Capillary 165 (H) 65 - 99 mg/dL  Glucose, capillary     Status: Abnormal   Collection Time: 02/02/16  9:22 AM  Result Value Ref Range   Glucose-Capillary 146 (H) 65 - 99 mg/dL     Goals Met:  Exercise tolerated well  Goals Unmet:  Not Applicable  Comments: Pt started cardiac rehab today.  Pt tolerated light exercise without difficulty. VSS, telemetry-sinus rhythm,  asymptomatic.  Medication list reconciled. Pt denies barriers to medicaiton compliance.  PSYCHOSOCIAL ASSESSMENT:  PHQ-3.  Pt has history of depression that is well managed with his current treatment.  Pt exhibits  positive coping skills, hopeful outlook with supportive family. No psychosocial needs identified at this time, no psychosocial interventions necessary.    Pt enjoys sleeping, and watching sports on TV.  Pt goal for cardiac rehab is to live heart healthy lifestyle, increase strength and stamina and lose weight.   Pt oriented to exercise equipment and routine.    Understanding verbalized.   Dr. Fransico Him is Medical Director for Cardiac Rehab at [facility].

## 2016-02-04 ENCOUNTER — Encounter (HOSPITAL_COMMUNITY)
Admission: RE | Admit: 2016-02-04 | Discharge: 2016-02-04 | Disposition: A | Discharge status: Home / Self Care | Payer: Managed Care, Other (non HMO) | Source: Ambulatory Visit | Attending: Cardiology | Admitting: Cardiology

## 2016-02-04 DIAGNOSIS — Z951 Presence of aortocoronary bypass graft: Secondary | ICD-10-CM | POA: Insufficient documentation

## 2016-02-04 LAB — GLUCOSE, CAPILLARY
GLUCOSE-CAPILLARY: 85 mg/dL (ref 65–99)
GLUCOSE-CAPILLARY: 102 mg/dL — AB (ref 65–99)
Glucose-Capillary: 148 mg/dL — ABNORMAL HIGH (ref 65–99)

## 2016-02-06 ENCOUNTER — Encounter (HOSPITAL_COMMUNITY): Payer: Managed Care, Other (non HMO)

## 2016-02-09 ENCOUNTER — Encounter (HOSPITAL_COMMUNITY): Payer: Managed Care, Other (non HMO)

## 2016-02-11 ENCOUNTER — Encounter (HOSPITAL_COMMUNITY)
Admission: RE | Admit: 2016-02-11 | Discharge: 2016-02-11 | Disposition: A | Discharge status: Home / Self Care | Payer: Managed Care, Other (non HMO) | Source: Ambulatory Visit | Attending: Cardiology | Admitting: Cardiology

## 2016-02-11 DIAGNOSIS — Z951 Presence of aortocoronary bypass graft: Secondary | ICD-10-CM

## 2016-02-11 LAB — GLUCOSE, CAPILLARY: Glucose-Capillary: 214 mg/dL — ABNORMAL HIGH (ref 65–99)

## 2016-02-11 NOTE — Progress Notes (Signed)
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  aspirin 325 MG tablet, Take 325 mg by mouth daily., Disp: , Rfl:  .  carvedilol (COREG) 12.5 MG tablet, Take 1 tablet (12.5 mg total) by mouth 2 (two) times daily with a meal. (Patient taking differently: Take 25 mg by mouth 2 (two) times daily with a meal. ), Disp: 60 tablet, Rfl: 1 .  clopidogrel (PLAVIX) 75 MG tablet, Take 75 mg by mouth daily., Disp: , Rfl:  .  FLUoxetine (PROZAC) 40 MG capsule, Take 40 mg by mouth daily., Disp: , Rfl:  .  furosemide (LASIX) 40 MG tablet, Take 40 mg by mouth daily. , Disp: , Rfl: 11 .  HUMALOG KWIKPEN 200 UNIT/ML SOPN, Inject 25-30 Units into the skin 3 (three) times daily before meals. Take 25 units with breakfast & lunch Take 30 units with dinner, Disp: , Rfl:  .  Insulin Degludec (TRESIBA FLEXTOUCH) 200 UNIT/ML SOPN, Inject 80 Units into the skin at bedtime. , Disp: , Rfl:  .  lansoprazole (PREVACID) 15 MG capsule, Take 15 mg by mouth at bedtime. , Disp: , Rfl:  .  losartan (COZAAR) 25 MG tablet, Take 25 mg by mouth daily., Disp: , Rfl:  .  metFORMIN (GLUCOPHAGE) 500 MG tablet, Take 1-2 tablets (500-1,000 mg total) by mouth 2 (two) times daily with a meal. Take 500 mg every morning  Take 1000 mg at bedtime, Disp: , Rfl:  .  Multiple Vitamin (MULTI-VITAMIN PO), Take 1 tablet by mouth daily., Disp: , Rfl:  .  oxymetazoline (AFRIN) 0.05 %  nasal spray, Place 1 spray into both nostrils 2 (two) times daily. , Disp: , Rfl:  .  potassium chloride SA (K-DUR,KLOR-CON) 20 MEQ tablet, Take 1 tablet (20 mEq total) by mouth daily., Disp: 30 tablet, Rfl: 1 .  rosuvastatin (CRESTOR) 40 MG tablet, Take 40 mg by mouth daily., Disp: , Rfl:   Past Medical History: Past Medical History:  Diagnosis Date  . AICD (automatic cardioverter/defibrillator) present   . Anxiety   . Atherosclerosis   . Cardiac defibrillator in place   . CHF (congestive heart failure) (HCC)   . Coronary artery disease   . Depression   . 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 GERD (gastroesophageal reflux disease)   . Headache   . Heart failure (HCC)    chronic combined systolic and diastolic  . History of bronchitis   . History of pneumonia 2016  . Hyperlipemia   . Hypertension    essential  . Ischemic cardiomyopathy   . Morbid obesity (HCC)   . Myocardial infarction 07/07/2008  . Presence of permanent cardiac pacemaker   . Sleep apnea   . Urinary frequency    "due to medication"  . Wears glasses     Tobacco Use: History  Smoking Status  . Former Smoker  . Quit date: 07/07/2008  Smokeless  Tobacco  . Never Used    Comment: quit in 2010    Labs: Recent Review Flowsheet Data    Labs for ITP Cardiac and Pulmonary Rehab Latest Ref Rng & Units 12/19/2015 12/19/2015 12/19/2015 12/19/2015 12/20/2015   Hemoglobin A1c 4.8 - 5.6 % - - - - -   PHART 7.350 - 7.450 7.332(L) 7.341(L) 7.306(L) - -   PCO2ART 32.0 - 48.0 mmHg 46.1 47.8 49.4(H) - -   HCO3 20.0 - 28.0 mmol/L 24.5 25.9 24.8 - -   TCO2 0 - 100 mmol/L 26 27 26 26 27    ACIDBASEDEF 0.0 - 2.0 mmol/L 2.0 - 2.0 - -   O2SAT % 91.0 98.0 97.0 - -      Capillary Blood Glucose: Lab Results  Component Value Date   GLUCAP 214 (H) 02/11/2016   GLUCAP 102 (H) 02/04/2016   GLUCAP 85 02/04/2016   GLUCAP 148 (H) 02/04/2016   GLUCAP 146 (H) 02/02/2016      Exercise Target Goals:    Exercise Program Goal: Individual exercise prescription set with THRR, safety & activity barriers. Participant demonstrates ability to understand and report RPE using BORG scale, to self-measure pulse accurately, and to acknowledge the importance of the exercise prescription.  Exercise Prescription Goal: Starting with aerobic activity 30 plus minutes a day, 3 days per week for initial exercise prescription. Provide home exercise prescription and guidelines that participant acknowledges understanding prior to discharge.  Activity Barriers & Risk Stratification:     Activity Barriers & Cardiac Risk Stratification - 01/29/16 0856      Activity Barriers & Cardiac Risk Stratification   Activity Barriers None   Cardiac Risk Stratification High      6 Minute Walk:     6 Minute Walk    Row Name 01/29/16 1437         6 Minute Walk   Phase Initial     Distance 1595 feet     Walk Time 6 minutes     MPH 3.02     METS 4.01     RPE 6     VO2 Peak 14.01     Symptoms No     Resting HR 74 bpm     Resting BP 102/70     Max Ex. HR 122 bpm     Max Ex. BP 122/60     2 Minute Post BP 120/70        Initial Exercise Prescription:     Initial Exercise Prescription - 01/29/16 1400      Date of Initial Exercise RX and Referring Provider   Date 01/29/16   Referring Provider Delrae Rend MD     Treadmill   MPH 2.9   Grade 1   Minutes 10   METs 3.62     Bike   Level 1   Minutes 10   METs 2.3     NuStep   Level 2   Minutes 10   METs 2     Prescription Details   Frequency (times per week) 3   Duration Progress to 45 minutes of aerobic exercise without signs/symptoms of physical distress     Intensity   THRR 40-80% of Max Heartrate 70-140   Ratings of Perceived Exertion 11-13   Perceived Dyspnea 0-4     Progression   Progression Continue to progress workloads to maintain intensity without signs/symptoms of physical distress.      Resistance Training   Training Prescription Yes   Weight 2lb  Reps 10-12      Perform Capillary Blood Glucose checks as needed.  Exercise Prescription Changes:     Exercise Prescription Changes    Row Name 02/10/16 0900             Exercise Review   Progression Yes         Response to Exercise   Blood Pressure (Admit) 124/80       Blood Pressure (Exercise) 138/82       Blood Pressure (Exit) 120/78       Heart Rate (Admit) 92 bpm       Heart Rate (Exercise) 121 bpm       Heart Rate (Exit) 72 bpm       Rating of Perceived Exertion (Exercise) 12       Duration Progress to 30 minutes of continuous aerobic without signs/symptoms of physical distress       Intensity THRR unchanged         Progression   Average METs 2.8         Resistance Training   Training Prescription Yes       Weight 3lbs       Reps 10-12         Treadmill   MPH 2.9       Grade 1       Minutes 10       METs 3.62         Bike   Level 1       Minutes 10       METs 2.39         NuStep   Level 2       Minutes 10       METs 2.4          Exercise Comments:     Exercise Comments    Row Name 02/10/16 0909           Exercise Comments There are no changes to current Ex Rx; will progress workloads without signs of physical distress based on pt RPE , BP,HR and exercise tolerance          Discharge Exercise Prescription (Final Exercise Prescription Changes):     Exercise Prescription Changes - 02/10/16 0900      Exercise Review   Progression Yes     Response to Exercise   Blood Pressure (Admit) 124/80   Blood Pressure (Exercise) 138/82   Blood Pressure (Exit) 120/78   Heart Rate (Admit) 92 bpm   Heart Rate (Exercise) 121 bpm   Heart Rate (Exit) 72 bpm   Rating of Perceived Exertion (Exercise) 12   Duration Progress to 30 minutes of continuous aerobic without signs/symptoms of physical distress   Intensity THRR unchanged     Progression   Average METs 2.8     Resistance  Training   Training Prescription Yes   Weight 3lbs   Reps 10-12     Treadmill   MPH 2.9   Grade 1   Minutes 10   METs 3.62     Bike   Level 1   Minutes 10   METs 2.39     NuStep   Level 2   Minutes 10   METs 2.4      Nutrition:  Target Goals: Understanding of nutrition guidelines, daily intake of sodium 1500mg , cholesterol 200mg , calories 30% from fat and 7% or less from saturated fats, daily to have 5 or more servings of fruits and vegetables.  Biometrics:     Pre Biometrics - 01/29/16 1439      Pre Biometrics   Waist Circumference 56.75 inches   Hip Circumference 53 inches   Waist to Hip Ratio 1.07 %   Triceps Skinfold 30 mm   % Body Fat 42.7 %   Grip Strength 53 kg   Flexibility 11.5 in   Single Leg Stand 30 seconds       Nutrition Therapy Plan and Nutrition Goals:     Nutrition Therapy & Goals - 01/29/16 1413      Nutrition Therapy   Diet Carb Modified, Therapeutic Lifestyle Change     Personal Nutrition Goals   Personal Goal #1 1-2 lb wt loss/week to a wt loss goal of 6-24 lb at graduation from Cardiac Rehab     Intervention Plan   Intervention Prescribe, educate and counsel regarding individualized specific dietary modifications aiming towards targeted core components such as weight, hypertension, lipid management, diabetes, heart failure and other comorbidities.   Expected Outcomes Short Term Goal: Understand basic principles of dietary content, such as calories, fat, sodium, cholesterol and nutrients.;Long Term Goal: Adherence to prescribed nutrition plan.      Nutrition Discharge: Nutrition Scores:     Nutrition Assessments - 01/29/16 1413      MEDFICTS Scores   Pre Score 21      Nutrition Goals Re-Evaluation:   Psychosocial: Target Goals: Acknowledge presence or absence of depression, maximize coping skills, provide positive support system. Participant is able to verbalize types and ability to use techniques and skills needed for  reducing stress and depression.  Initial Review & Psychosocial Screening:     Initial Psych Review & Screening - 01/29/16 1644      Family Dynamics   Good Support System? Yes     Barriers   Psychosocial barriers to participate in program There are no identifiable barriers or psychosocial needs.      Quality of Life Scores:     Quality of Life - 01/29/16 1439      Quality of Life Scores   Health/Function Pre 17.6 %   Socioeconomic Pre 24.5 %   Psych/Spiritual Pre 18.43 %   Family Pre 24 %   GLOBAL Pre 20.13 %      PHQ-9: Recent Review Flowsheet Data    Depression screen [facility] 2/9 02/02/2016 01/29/2016   Decreased Interest 0 0   Down, Depressed, Hopeless 0 0   PHQ - 2 Score 0 0      Psychosocial Evaluation and Intervention:     Psychosocial Evaluation - 02/02/16 1641      Psychosocial Evaluation & Interventions   Interventions Encouraged to exercise with the program and follow exercise prescription   Comments no psychosocial needs identified, no intervention necessary       Psychosocial Re-Evaluation:     Psychosocial Re-Evaluation    Row Name 02/10/16 0941             Psychosocial Re-Evaluation   Interventions Encouraged to attend Cardiac Rehabilitation for the exercise       Comments no psychosocial needs identified, no interventions necessary. pt is exercising at home using his apartment gym.        Continued Psychosocial Services Needed No          Vocational Rehabilitation: Provide vocational rehab assistance to qualifying candidates.   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      Education: Education Goals: Education classes will be provided on a weekly basis, covering required topics. Participant will state  understanding/return demonstration of topics presented.  Learning Barriers/Preferences:     Learning Barriers/Preferences - 01/29/16 0856      Learning Barriers/Preferences   Learning Barriers Sight   Learning Preferences Skilled Demonstration      Education Topics: Count Your Pulse:  -Group instruction provided by verbal instruction, demonstration, patient participation and written materials to support subject.  Instructors address importance of being able to find your pulse and how to count your pulse when at home without a heart monitor.  Patients get hands on experience counting their pulse with staff help and individually.   Heart Attack, Angina, and Risk Factor Modification:  -Group instruction provided by verbal instruction, video, and written materials to support subject.  Instructors address signs and symptoms of angina and heart attacks.    Also discuss risk factors for heart disease and how to make changes to improve heart health risk factors.   Functional Fitness:  -Group instruction provided by verbal instruction, demonstration, patient participation, and written materials to support subject.  Instructors address safety measures for doing things around the house.  Discuss how to get up and down off the floor, how to pick things up properly, how to safely get out of a chair without assistance, and balance training.   Meditation and Mindfulness:  -Group instruction provided by verbal instruction, patient participation, and written materials to support subject.  Instructor addresses importance of mindfulness and meditation practice to help reduce stress and improve awareness.  Instructor also leads participants through a meditation exercise.    Stretching for Flexibility and Mobility:  -Group instruction provided by verbal instruction, patient participation, and written materials to support subject.  Instructors lead participants through series of stretches that are designed  to increase flexibility thus improving mobility.  These stretches are additional exercise for major muscle groups that are typically performed during regular warm up and cool down.   Hands Only CPR Anytime:  -Group instruction provided by verbal instruction, video, patient participation and written materials to support subject.  Instructors co-teach with AHA video for hands only CPR.  Participants get hands on experience with mannequins.   Nutrition I class: Heart Healthy Eating:  -Group instruction provided by PowerPoint slides, verbal discussion, and written materials to support subject matter. The instructor gives an explanation and review of the Therapeutic Lifestyle Changes diet recommendations, which includes a discussion on lipid goals, dietary fat, sodium, fiber, plant stanol/sterol esters, sugar, and the components of a well-balanced, healthy diet.   Nutrition II class: Lifestyle Skills:  -Group instruction provided by PowerPoint slides, verbal discussion, and written materials to support subject matter. The instructor gives an explanation and review of label reading, grocery shopping for heart health, heart healthy recipe modifications, and ways to make healthier choices when eating out.   Diabetes Question & Answer:  -Group instruction provided by PowerPoint slides, verbal discussion, and written materials to support subject matter. The instructor gives an explanation and review of diabetes co-morbidities, pre- and post-prandial blood glucose goals, pre-exercise blood glucose goals, signs, symptoms, and treatment of hypoglycemia and hyperglycemia, and foot care basics.   Diabetes Blitz:  -Group instruction provided by PowerPoint slides, verbal discussion, and written materials to support subject matter. The instructor gives an explanation  and review of the physiology behind type 1 and type 2 diabetes, diabetes medications and rational behind using different medications, pre- and  post-prandial blood glucose recommendations and Hemoglobin A1c goals, diabetes diet, and exercise including blood glucose guidelines for exercising safely.    Portion Distortion:  -Group instruction provided by PowerPoint slides, verbal discussion, written materials, and food models to support subject matter. The instructor gives an explanation of serving size versus portion size, changes in portions sizes over the last 20 years, and what consists of a serving from each food group.   Stress Management:  -Group instruction provided by verbal instruction, video, and written materials to support subject matter.  Instructors review role of stress in heart disease and how to cope with stress positively.     Exercising on Your Own:  -Group instruction provided by verbal instruction, power point, and written materials to support subject.  Instructors discuss benefits of exercise, components of exercise, frequency and intensity of exercise, and end points for exercise.  Also discuss use of nitroglycerin and activating EMS.  Review options of places to exercise outside of rehab.  Review guidelines for sex with heart disease.   Cardiac Drugs I:  -Group instruction provided by verbal instruction and written materials to support subject.  Instructor reviews cardiac drug classes: antiplatelets, anticoagulants, beta blockers, and statins.  Instructor discusses reasons, side effects, and lifestyle considerations for each drug class.   Cardiac Drugs II:  -Group instruction provided by verbal instruction and written materials to support subject.  Instructor reviews cardiac drug classes: angiotensin converting enzyme inhibitors (ACE-I), angiotensin II receptor blockers (ARBs), nitrates, and calcium channel blockers.  Instructor discusses reasons, side effects, and lifestyle considerations for each drug class. Flowsheet Row CARDIAC REHAB PHASE II EXERCISE from 02/11/2016 in [facility] CARDIAC  REHAB  Date  02/11/16  Instruction Review Code  2- meets goals/outcomes      Anatomy and Physiology of the Circulatory System:  -Group instruction provided by verbal instruction, video, and written materials to support subject.  Reviews functional anatomy of heart, how it relates to various diagnoses, and what role the heart plays in the overall system. Flowsheet Row CARDIAC REHAB PHASE II EXERCISE from 02/11/2016 in [facility] CARDIAC REHAB  Date  02/04/16  Instruction Review Code  2- meets goals/outcomes      Knowledge Questionnaire Score:     Knowledge Questionnaire Score - 01/29/16 1437      Knowledge Questionnaire Score   Pre Score 23/24      Core Components/Risk Factors/Patient Goals at Admission:     Personal Goals and Risk Factors at Admission - 01/29/16 0858      Core Components/Risk Factors/Patient Goals on Admission    Weight Management Yes;Obesity   Intervention Weight Management: Develop a combined nutrition and exercise program designed to reach desired caloric intake, while maintaining appropriate intake of nutrient and fiber, sodium and fats, and appropriate energy expenditure required for the weight goal.   Expected Outcomes Short Term: Continue to assess and modify interventions until short term weight is achieved;Weight Maintenance: Understanding of the daily nutrition guidelines, which includes 25-35% calories from fat, 7% or less cal from saturated fats, less than 200mg  cholesterol, less than 1.5gm of sodium, & 5 or more servings of fruits and vegetables daily;Weight Loss: Understanding of general recommendations for a balanced deficit meal plan, which promotes 1-2 lb weight loss per week and includes a negative energy balance of [phone] kcal/d;Understanding recommendations for meals to include  15-35% energy as protein, 25-35% energy from fat, 35-60% energy from carbohydrates, less than 200mg  of dietary cholesterol, 20-35 gm of total fiber  daily;Understanding of distribution of calorie intake throughout the day with the consumption of 4-5 meals/snacks;Weight Gain: Understanding of general recommendations for a high calorie, high protein meal plan that promotes weight gain by distributing calorie intake throughout the day with the consumption for 4-5 meals, snacks, and/or supplements   Increase Strength and Stamina Yes   Intervention Develop an individualized exercise prescription for aerobic and resistive training based on initial evaluation findings, risk stratification, comorbidities and participant's personal goals.;Provide advice, education, support and counseling about physical activity/exercise needs.   Expected Outcomes Achievement of increased cardiorespiratory fitness and enhanced flexibility, muscular endurance and strength shown through measurements of functional capacity and personal statement of participant.   Diabetes Yes   Intervention Provide education about signs/symptoms and action to take for hypo/hyperglycemia.;Provide education about proper nutrition, including hydration, and aerobic/resistive exercise prescription along with prescribed medications to achieve blood glucose in normal ranges: Fasting glucose 65-99 mg/dL   Expected Outcomes Short Term: Participant verbalizes understanding of the signs/symptoms and immediate care of hyper/hypoglycemia, proper foot care and importance of medication, aerobic/resistive exercise and nutrition plan for blood glucose control.;Long Term: Attainment of HbA1C < 7%.   Hypertension Yes   Intervention Provide education on lifestyle modifcations including regular physical activity/exercise, weight management, moderate sodium restriction and increased consumption of fresh fruit, vegetables, and low fat dairy, alcohol moderation, and smoking cessation.;Monitor prescription use compliance.   Expected Outcomes Short Term: Continued assessment and intervention until BP is < 140/55mm HG in  hypertensive participants. < 130/63mm HG in hypertensive participants with diabetes, heart failure or chronic kidney disease.;Long Term: Maintenance of blood pressure at goal levels.   Lipids Yes   Intervention Provide education and support for participant on nutrition & aerobic/resistive exercise along with prescribed medications to achieve LDL 70mg , HDL >40mg .   Expected Outcomes Long Term: Cholesterol controlled with medications as prescribed, with individualized exercise RX and with personalized nutrition plan. Value goals: LDL < 70mg , HDL > 40 mg.   Personal Goal Other Yes   Personal Goal weight loss, improve stamina and overall quality of life    Intervention individualized exercise program, education and nutritional consult   Expected Outcomes increase overal functional capacity and weight loss      Core Components/Risk Factors/Patient Goals Review:      Goals and Risk Factor Review    Row Name 01/29/16 0903             Core Components/Risk Factors/Patient Goals Review   Personal Goals Review Weight Management/Obesity;Other;Increase Strength and Stamina       Review increase stamina and lose weight       Expected Outcomes increase overall functional capacity and improve quality of life          Core Components/Risk Factors/Patient Goals at Discharge (Final Review):      Goals and Risk Factor Review - 01/29/16 0903      Core Components/Risk Factors/Patient Goals Review   Personal Goals Review Weight Management/Obesity;Other;Increase Strength and Stamina   Review increase stamina and lose weight   Expected Outcomes increase overall functional capacity and improve quality of life      ITP Comments:     ITP Comments    Row Name 01/29/16 0858 01/29/16 1503         ITP Comments Dr. Marland Mcalpine Dr. Armanda Magic  Comments: Pt is making expected progress toward personal goals after completing 3 sessions. Recommend continued exercise and life style modification  education including  stress management and relaxation techniques to decrease cardiac risk profile.

## 2016-02-13 ENCOUNTER — Encounter (HOSPITAL_COMMUNITY)
Admission: RE | Admit: 2016-02-13 | Discharge: 2016-02-13 | Disposition: A | Discharge status: Home / Self Care | Payer: Managed Care, Other (non HMO) | Source: Ambulatory Visit | Attending: Cardiology | Admitting: Cardiology

## 2016-02-13 DIAGNOSIS — Z951 Presence of aortocoronary bypass graft: Secondary | ICD-10-CM | POA: Diagnosis not present

## 2016-02-13 NOTE — Progress Notes (Signed)
Reviewed home exercise with pt today.  Pt plans to go to fitness center at apartment complex for exercise.  Reviewed THR, pulse, RPE, sign and symptoms, NTG use, and when to call 911 or MD.  Also discussed weather considerations and indoor options.  Pt voiced understanding.    [place] Mihail Prettyman,MS ACSM RCEP

## 2016-02-16 ENCOUNTER — Encounter (HOSPITAL_COMMUNITY)
Admission: RE | Admit: 2016-02-16 | Discharge: 2016-02-16 | Disposition: A | Discharge status: Home / Self Care | Payer: Managed Care, Other (non HMO) | Source: Ambulatory Visit | Attending: Cardiology | Admitting: Cardiology

## 2016-02-16 DIAGNOSIS — Z951 Presence of aortocoronary bypass graft: Secondary | ICD-10-CM | POA: Diagnosis not present

## 2016-02-18 ENCOUNTER — Encounter (HOSPITAL_COMMUNITY): Payer: Managed Care, Other (non HMO)

## 2016-02-20 ENCOUNTER — Encounter (HOSPITAL_COMMUNITY)
Admission: RE | Admit: 2016-02-20 | Discharge: 2016-02-20 | Disposition: A | Discharge status: Home / Self Care | Payer: Managed Care, Other (non HMO) | Source: Ambulatory Visit | Attending: Cardiology | Admitting: Cardiology

## 2016-02-20 DIAGNOSIS — Z951 Presence of aortocoronary bypass graft: Secondary | ICD-10-CM

## 2016-02-23 ENCOUNTER — Encounter (HOSPITAL_COMMUNITY)
Admission: RE | Admit: 2016-02-23 | Discharge: 2016-02-23 | Disposition: A | Discharge status: Home / Self Care | Payer: Managed Care, Other (non HMO) | Source: Ambulatory Visit | Attending: Cardiology | Admitting: Cardiology

## 2016-02-23 DIAGNOSIS — Z951 Presence of aortocoronary bypass graft: Secondary | ICD-10-CM

## 2016-02-25 ENCOUNTER — Encounter (HOSPITAL_COMMUNITY)
Admission: RE | Admit: 2016-02-25 | Discharge: 2016-02-25 | Disposition: A | Discharge status: Home / Self Care | Payer: Managed Care, Other (non HMO) | Source: Ambulatory Visit | Attending: Cardiology | Admitting: Cardiology

## 2016-02-25 DIAGNOSIS — Z951 Presence of aortocoronary bypass graft: Secondary | ICD-10-CM | POA: Diagnosis not present

## 2016-03-01 ENCOUNTER — Encounter (HOSPITAL_COMMUNITY)
Admission: RE | Admit: 2016-03-01 | Discharge: 2016-03-01 | Disposition: A | Discharge status: Home / Self Care | Payer: Managed Care, Other (non HMO) | Source: Ambulatory Visit | Attending: Cardiology | Admitting: Cardiology

## 2016-03-01 DIAGNOSIS — Z951 Presence of aortocoronary bypass graft: Secondary | ICD-10-CM

## 2016-03-03 ENCOUNTER — Encounter (HOSPITAL_COMMUNITY)
Admission: RE | Admit: 2016-03-03 | Discharge: 2016-03-03 | Disposition: A | Discharge status: Home / Self Care | Payer: Managed Care, Other (non HMO) | Source: Ambulatory Visit | Attending: Cardiology | Admitting: Cardiology

## 2016-03-03 ENCOUNTER — Encounter (HOSPITAL_COMMUNITY): Payer: Managed Care, Other (non HMO)

## 2016-03-03 DIAGNOSIS — Z951 Presence of aortocoronary bypass graft: Secondary | ICD-10-CM

## 2016-03-05 ENCOUNTER — Encounter (HOSPITAL_COMMUNITY)
Admission: RE | Admit: 2016-03-05 | Discharge: 2016-03-05 | Disposition: A | Discharge status: Home / Self Care | Payer: Managed Care, Other (non HMO) | Source: Ambulatory Visit | Attending: Surgery | Admitting: Surgery

## 2016-03-05 ENCOUNTER — Encounter (HOSPITAL_COMMUNITY): Payer: Managed Care, Other (non HMO)

## 2016-03-05 DIAGNOSIS — Z951 Presence of aortocoronary bypass graft: Secondary | ICD-10-CM | POA: Insufficient documentation

## 2016-03-08 ENCOUNTER — Encounter (HOSPITAL_COMMUNITY)
Admission: RE | Admit: 2016-03-08 | Discharge: 2016-03-08 | Disposition: A | Discharge status: Home / Self Care | Payer: Managed Care, Other (non HMO) | Source: Ambulatory Visit | Attending: Cardiology | Admitting: Cardiology

## 2016-03-08 ENCOUNTER — Encounter (HOSPITAL_COMMUNITY): Payer: Managed Care, Other (non HMO)

## 2016-03-08 DIAGNOSIS — Z951 Presence of aortocoronary bypass graft: Secondary | ICD-10-CM | POA: Diagnosis not present

## 2016-03-08 LAB — GLUCOSE, CAPILLARY: GLUCOSE-CAPILLARY: 134 mg/dL — AB (ref 65–99)

## 2016-03-09 NOTE — Progress Notes (Signed)
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  aspirin 325 MG tablet, Take 325 mg by mouth daily., Disp: , Rfl:  .  carvedilol (COREG) 12.5 MG tablet, Take 1 tablet (12.5 mg total) by mouth 2 (two) times daily with a meal. (Patient taking differently: Take 25 mg by mouth 2 (two) times daily with a meal. ), Disp: 60 tablet, Rfl: 1 .  clopidogrel (PLAVIX) 75 MG tablet, Take 75 mg by mouth daily., Disp: , Rfl:  .  FLUoxetine (PROZAC) 40 MG capsule, Take 40 mg by mouth daily., Disp: , Rfl:  .  furosemide (LASIX) 40 MG tablet, Take 40 mg by mouth daily. , Disp: , Rfl: 11 .  HUMALOG KWIKPEN 200 UNIT/ML SOPN, Inject 25-30 Units into the skin 3 (three) times daily before meals. Take 25 units with breakfast & lunch Take 30 units with dinner, Disp: , Rfl:  .  Insulin Degludec (TRESIBA FLEXTOUCH) 200 UNIT/ML SOPN, Inject 80 Units into the skin at bedtime. , Disp: , Rfl:  .  lansoprazole (PREVACID) 15 MG capsule, Take 15 mg by mouth at bedtime. , Disp: , Rfl:  .  losartan (COZAAR) 25 MG tablet, Take 25 mg by mouth daily., Disp: , Rfl:  .  metFORMIN (GLUCOPHAGE) 500 MG tablet, Take 1-2 tablets (500-1,000 mg total) by mouth 2 (two) times daily with a meal. Take 500 mg every morning  Take 1000 mg at bedtime, Disp: , Rfl:  .  Multiple Vitamin (MULTI-VITAMIN PO), Take 1 tablet by mouth daily., Disp: , Rfl:  .  oxymetazoline (AFRIN) 0.05 %  nasal spray, Place 1 spray into both nostrils 2 (two) times daily. , Disp: , Rfl:  .  potassium chloride SA (K-DUR,KLOR-CON) 20 MEQ tablet, Take 1 tablet (20 mEq total) by mouth daily., Disp: 30 tablet, Rfl: 1 .  rosuvastatin (CRESTOR) 40 MG tablet, Take 40 mg by mouth daily., Disp: , Rfl:   Past Medical History: Past Medical History:  Diagnosis Date  . AICD (automatic cardioverter/defibrillator) present   . Anxiety   . Atherosclerosis   . Cardiac defibrillator in place   . CHF (congestive heart failure) (HCC)   . Coronary artery disease   . Depression   . 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 GERD (gastroesophageal reflux disease)   . Headache   . Heart failure (HCC)    chronic combined systolic and diastolic  . History of bronchitis   . History of pneumonia 2016  . Hyperlipemia   . Hypertension    essential  . Ischemic cardiomyopathy   . Morbid obesity (HCC)   . Myocardial infarction 07/07/2008  . Presence of permanent cardiac pacemaker   . Sleep apnea   . Urinary frequency    "due to medication"  . Wears glasses     Tobacco Use: History  Smoking Status  . Former Smoker  . Quit date: 07/07/2008  Smokeless  Tobacco  . Never Used    Comment: quit in 2010    Labs: Recent Review Flowsheet Data    Labs for ITP Cardiac and Pulmonary Rehab Latest Ref Rng & Units 12/19/2015 12/19/2015 12/19/2015 12/19/2015 12/20/2015   Hemoglobin A1c 4.8 - 5.6 % - - - - -   PHART 7.350 - 7.450 7.332(L) 7.341(L) 7.306(L) - -   PCO2ART 32.0 - 48.0 mmHg 46.1 47.8 49.4(H) - -   HCO3 20.0 - 28.0 mmol/L 24.5 25.9 24.8 - -   TCO2 0 - 100 mmol/L 26 27 26 26 27    ACIDBASEDEF 0.0 - 2.0 mmol/L 2.0 - 2.0 - -   O2SAT % 91.0 98.0 97.0 - -      Capillary Blood Glucose: Lab Results  Component Value Date   GLUCAP 134 (H) 03/08/2016   GLUCAP 214 (H) 02/11/2016   GLUCAP 102 (H) 02/04/2016   GLUCAP 85 02/04/2016   GLUCAP 148 (H) 02/04/2016      Exercise Target Goals:    Exercise Program Goal: Individual exercise prescription set with THRR, safety & activity barriers. Participant demonstrates ability to understand and report RPE using BORG scale, to self-measure pulse accurately, and to acknowledge the importance of the exercise prescription.  Exercise Prescription Goal: Starting with aerobic activity 30 plus minutes a day, 3 days per week for initial exercise prescription. Provide home exercise prescription and guidelines that participant acknowledges understanding prior to discharge.  Activity Barriers & Risk Stratification:     Activity Barriers & Cardiac Risk Stratification - 01/29/16 0856      Activity Barriers & Cardiac Risk Stratification   Activity Barriers None   Cardiac Risk Stratification High      6 Minute Walk:     6 Minute Walk    Row Name 01/29/16 1437         6 Minute Walk   Phase Initial     Distance 1595 feet     Walk Time 6 minutes     MPH 3.02     METS 4.01     RPE 6     VO2 Peak 14.01     Symptoms No     Resting HR 74 bpm     Resting BP 102/70     Max Ex. HR 122 bpm     Max Ex. BP 122/60     2 Minute Post BP 120/70        Initial Exercise Prescription:     Initial Exercise Prescription - 01/29/16 1400      Date of Initial Exercise RX and Referring Provider   Date 01/29/16   Referring Provider Delrae Rend MD     Treadmill   MPH 2.9   Grade 1   Minutes 10   METs 3.62     Bike   Level 1   Minutes 10   METs 2.3     NuStep   Level 2   Minutes 10   METs 2     Prescription Details   Frequency (times per week) 3   Duration Progress to 45 minutes of aerobic exercise without signs/symptoms of physical distress     Intensity   THRR 40-80% of Max Heartrate 70-140   Ratings of Perceived Exertion 11-13   Perceived Dyspnea 0-4     Progression   Progression Continue to progress workloads to maintain intensity without signs/symptoms of physical distress.      Resistance Training   Training Prescription Yes   Weight 2lb  Reps 10-12      Perform Capillary Blood Glucose checks as needed.  Exercise Prescription Changes:      Exercise Prescription Changes    Row Name 02/10/16 0900 03/09/16 1100           Exercise Review   Progression Yes Yes        Response to Exercise   Blood Pressure (Admit) 124/80 122/84      Blood Pressure (Exercise) 138/82 148/72      Blood Pressure (Exit) 120/78 108/68      Heart Rate (Admit) 92 bpm 93 bpm      Heart Rate (Exercise) 121 bpm 136 bpm      Heart Rate (Exit) 72 bpm 92 bpm      Rating of Perceived Exertion (Exercise) 12 13      Symptoms  - none      Comments  - Reviewed HEP on 02/13/16      Duration Progress to 30 minutes of continuous aerobic without signs/symptoms of physical distress Progress to 30 minutes of continuous aerobic without signs/symptoms of physical distress      Intensity THRR unchanged THRR unchanged        Progression   Average METs 2.8 2.8        Resistance Training   Training Prescription Yes Yes      Weight 3lbs 5lbs      Reps 10-12 10-12        Treadmill   MPH 2.9 2.9      Grade 1 1      Minutes 10 10      METs 3.62 3.62        Bike   Level 1 1.8      Minutes 10 10      METs 2.39 3.51        NuStep   Level 2 5      Minutes 10 10      METs 2.4 3.1        Home Exercise Plan   Plans to continue exercise at  [facility] (comment)  HEP reviewd on 02/13/16. Exercise at apartment gym      Frequency  - Add 2 additional days to program exercise sessions.         Exercise Comments:      Exercise Comments    Row Name 02/10/16 0909 03/09/16 1105         Exercise Comments There are no changes to current Ex Rx; will progress workloads without signs of physical distress based on pt RPE , BP,HR and exercise tolerance Reviewed METs and goals. Pt is tolerating exercise well; will continue to monitor exercise progression.         Discharge Exercise  Prescription (Final Exercise Prescription Changes):     Exercise Prescription Changes - 03/09/16 1100      Exercise Review   Progression Yes     Response to Exercise   Blood Pressure (Admit) 122/84   Blood Pressure (Exercise) 148/72   Blood Pressure (Exit) 108/68   Heart Rate (Admit) 93 bpm   Heart Rate (Exercise) 136 bpm   Heart Rate (Exit) 92 bpm   Rating of Perceived Exertion (Exercise) 13   Symptoms none   Comments Reviewed HEP on 02/13/16   Duration Progress to 30 minutes of continuous aerobic without signs/symptoms of physical distress   Intensity THRR unchanged     Progression   Average METs 2.8     Resistance Training  Training Prescription Yes   Weight 5lbs   Reps 10-12     Treadmill   MPH 2.9   Grade 1   Minutes 10   METs 3.62     Bike   Level 1.8   Minutes 10   METs 3.51     NuStep   Level 5   Minutes 10   METs 3.1     Home Exercise Plan   [facility]  HEP reviewd on 02/13/16. Exercise at apartment gym   Frequency Add 2 additional days to program exercise sessions.      Nutrition:  Target Goals: Understanding of nutrition guidelines, daily intake of sodium 1500mg , cholesterol 200mg , calories 30% from fat and 7% or less from saturated fats, daily to have 5 or more servings of fruits and vegetables.  Biometrics:     Pre Biometrics - 01/29/16 1439      Pre Biometrics   Waist Circumference 56.75 inches   Hip Circumference 53 inches   Waist to Hip Ratio 1.07 %   Triceps Skinfold 30 mm   % Body Fat 42.7 %   Grip Strength 53 kg   Flexibility 11.5 in   Single Leg Stand 30 seconds       Nutrition Therapy Plan and Nutrition Goals:     Nutrition Therapy & Goals - 01/29/16 1413      Nutrition Therapy   Diet Carb Modified, Therapeutic Lifestyle Change     Personal Nutrition Goals   Personal Goal #1 1-2 lb wt loss/week to a wt loss goal of 6-24 lb at graduation from Cardiac Rehab      Intervention Plan   Intervention Prescribe, educate and counsel regarding individualized specific dietary modifications aiming towards targeted core components such as weight, hypertension, lipid management, diabetes, heart failure and other comorbidities.   Expected Outcomes Short Term Goal: Understand basic principles of dietary content, such as calories, fat, sodium, cholesterol and nutrients.;Long Term Goal: Adherence to prescribed nutrition plan.      Nutrition Discharge: Nutrition Scores:     Nutrition Assessments - 01/29/16 1413      MEDFICTS Scores   Pre Score 21      Nutrition Goals Re-Evaluation:   Psychosocial: Target Goals: Acknowledge presence or absence of depression, maximize coping skills, provide positive support system. Participant is able to verbalize types and ability to use techniques and skills needed for reducing stress and depression.  Initial Review & Psychosocial Screening:     Initial Psych Review & Screening - 01/29/16 1644      Family Dynamics   Good Support System? Yes     Barriers   Psychosocial barriers to participate in program There are no identifiable barriers or psychosocial needs.      Quality of Life Scores:     Quality of Life - 01/29/16 1439      Quality of Life Scores   Health/Function Pre 17.6 %   Socioeconomic Pre 24.5 %   Psych/Spiritual Pre 18.43 %   Family Pre 24 %   GLOBAL Pre 20.13 %      PHQ-9: Recent Review Flowsheet Data    [facility] 02/02/2016 01/29/2016   Decreased Interest 0 0   Down, Depressed, Hopeless 0 0   PHQ - 2 Score 0 0      Psychosocial Evaluation and Intervention:     Psychosocial Evaluation - 02/02/16 1641      Psychosocial Evaluation & Interventions   Interventions  Encouraged to exercise with the program and follow exercise prescription   Comments no psychosocial needs identified, no intervention necessary       Psychosocial Re-Evaluation:     Psychosocial  Re-Evaluation    Row Name 02/10/16 0941 03/08/16 0808           Psychosocial Re-Evaluation   Interventions Encouraged to attend Cardiac Rehabilitation for the exercise Encouraged to attend Cardiac Rehabilitation for the exercise      Comments no psychosocial needs identified, no interventions necessary. pt is exercising at home using his apartment gym.  no psychosocial needs identified, no interventions necessary. pt is using his cardiac rehab routine at his apartment gym.        Continued Psychosocial Services Needed No No         Vocational Rehabilitation: Provide vocational rehab assistance to qualifying candidates.   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      Education: Education Goals: Education classes will be provided on a weekly basis, covering required topics. Participant will state understanding/return demonstration of topics presented.  Learning Barriers/Preferences:     Learning Barriers/Preferences - 01/29/16 0856      Learning Barriers/Preferences   Learning Barriers Sight   Learning Preferences Skilled Demonstration      Education Topics: Count Your Pulse:  -Group instruction provided by verbal instruction, demonstration, patient participation and written materials to support subject.  Instructors address importance of being able to find your pulse and how to count your pulse when at home without a heart monitor.  Patients get hands on experience counting their pulse with staff help and individually.   Heart Attack, Angina, and Risk Factor Modification:  -Group instruction provided by verbal instruction, video, and written materials to support subject.  Instructors address signs and symptoms of angina and heart attacks.    Also  discuss risk factors for heart disease and how to make changes to improve heart health risk factors.   Functional Fitness:  -Group instruction provided by verbal instruction, demonstration, patient participation, and written materials to support subject.  Instructors address safety measures for doing things around the house.  Discuss how to get up and down off the floor, how to pick things up properly, how to safely get out of a chair without assistance, and balance training. Flowsheet Row CARDIAC REHAB PHASE [facility]  Date  02/13/16  Instruction Review Code  2- meets goals/outcomes      Meditation and Mindfulness:  -Group instruction provided by verbal instruction, patient participation, and written materials to support subject.  Instructor addresses importance of mindfulness and meditation practice to help reduce stress and improve awareness.  Instructor also leads participants through a meditation exercise.    Stretching for Flexibility and Mobility:  -Group instruction provided by verbal instruction, patient participation, and written materials to support subject.  Instructors lead participants through series of stretches that are designed to increase flexibility thus improving mobility.  These stretches are additional exercise for major muscle groups that are typically performed during regular warm up and cool down.   Hands Only CPR Anytime:  -Group instruction provided by verbal instruction, video, patient participation and written materials to support subject.  Instructors co-teach with  AHA video for hands only CPR.  Participants get hands on experience with mannequins.   Nutrition I class: Heart Healthy Eating:  -Group instruction provided by PowerPoint slides, verbal discussion, and written materials to support subject matter. The instructor gives an explanation and review of the Therapeutic Lifestyle Changes diet  recommendations, which includes a discussion on lipid goals, dietary fat, sodium, fiber, plant stanol/sterol esters, sugar, and the components of a well-balanced, healthy diet.   Nutrition II class: Lifestyle Skills:  -Group instruction provided by PowerPoint slides, verbal discussion, and written materials to support subject matter. The instructor gives an explanation and review of label reading, grocery shopping for heart health, heart healthy recipe modifications, and ways to make healthier choices when eating out.   Diabetes Question & Answer:  -Group instruction provided by PowerPoint slides, verbal discussion, and written materials to support subject matter. The instructor gives an explanation and review of diabetes co-morbidities, pre- and post-prandial blood glucose goals, pre-exercise blood glucose goals, signs, symptoms, and treatment of hypoglycemia and hyperglycemia, and foot care basics.   Diabetes Blitz:  -Group instruction provided by PowerPoint slides, verbal discussion, and written materials to support subject matter. The instructor gives an explanation and review of the physiology behind type 1 and type 2 diabetes, diabetes medications and rational behind using different medications, pre- and post-prandial blood glucose recommendations and Hemoglobin A1c goals, diabetes diet, and exercise including blood glucose guidelines for exercising safely.    Portion Distortion:  -Group instruction provided by PowerPoint slides, verbal discussion, written materials, and food models to support subject matter. The instructor gives an explanation of serving size versus portion size, changes in portions sizes over the last 20 years, and what consists of a serving from each food group. Flowsheet Row CARDIAC REHAB PHASE II EXERCISE from 03/03/2016 in [facility] CARDIAC REHAB  Date  03/03/16 [Holiday Eating Survival Tips]  Educator  RD  Instruction Review Code  2- meets  goals/outcomes      Stress Management:  -Group instruction provided by verbal instruction, video, and written materials to support subject matter.  Instructors review role of stress in heart disease and how to cope with stress positively.     Exercising on Your Own:  -Group instruction provided by verbal instruction, power point, and written materials to support subject.  Instructors discuss benefits of exercise, components of exercise, frequency and intensity of exercise, and end points for exercise.  Also discuss use of nitroglycerin and activating EMS.  Review options of places to exercise outside of rehab.  Review guidelines for sex with heart disease. Flowsheet Row CARDIAC REHAB PHASE II EXERCISE from 03/03/2016 in [facility] CARDIAC REHAB  Date  02/20/16  Instruction Review Code  2- meets goals/outcomes      Cardiac Drugs I:  -Group instruction provided by verbal instruction and written materials to support subject.  Instructor reviews cardiac drug classes: antiplatelets, anticoagulants, beta blockers, and statins.  Instructor discusses reasons, side effects, and lifestyle considerations for each drug class.   Cardiac Drugs II:  -Group instruction provided by verbal instruction and written materials to support subject.  Instructor reviews cardiac drug classes: angiotensin converting enzyme inhibitors (ACE-I), angiotensin II receptor blockers (ARBs), nitrates, and calcium channel blockers.  Instructor discusses reasons, side effects, and lifestyle considerations for each drug class. Flowsheet Row CARDIAC REHAB PHASE II EXERCISE from 03/03/2016 in [facility] CARDIAC REHAB  Date  02/11/16  Instruction Review Code  2- meets goals/outcomes  Anatomy and Physiology of the Circulatory System:  -Group instruction provided by verbal instruction, video, and written materials to support subject.  Reviews functional anatomy of heart, how it relates to  various diagnoses, and what role the heart plays in the overall system. Flowsheet Row CARDIAC REHAB PHASE II EXERCISE from 03/03/2016 in [facility] CARDIAC REHAB  Date  02/04/16  Instruction Review Code  2- meets goals/outcomes      Knowledge Questionnaire Score:     Knowledge Questionnaire Score - 01/29/16 1437      Knowledge Questionnaire Score   Pre Score 23/24      Core Components/Risk Factors/Patient Goals at Admission:     Personal Goals and Risk Factors at Admission - 01/29/16 0858      Core Components/Risk Factors/Patient Goals on Admission    Weight Management Yes;Obesity   Intervention Weight Management: Develop a combined nutrition and exercise program designed to reach desired caloric intake, while maintaining appropriate intake of nutrient and fiber, sodium and fats, and appropriate energy expenditure required for the weight goal.   Expected Outcomes Short Term: Continue to assess and modify interventions until short term weight is achieved;Weight Maintenance: Understanding of the daily nutrition guidelines, which includes 25-35% calories from fat, 7% or less cal from saturated fats, less than 200mg  cholesterol, less than 1.5gm of sodium, & 5 or more servings of fruits and vegetables daily;Weight Loss: Understanding of general recommendations for a balanced deficit meal plan, which promotes 1-2 lb weight loss per week and includes a negative energy balance of [phone] kcal/d;Understanding recommendations for meals to include 15-35% energy as protein, 25-35% energy from fat, 35-60% energy from carbohydrates, less than 200mg  of dietary cholesterol, 20-35 gm of total fiber daily;Understanding of distribution of calorie intake throughout the day with the consumption of 4-5 meals/snacks;Weight Gain: Understanding of general recommendations for a high calorie, high protein meal plan that promotes weight gain by distributing calorie intake throughout the day with the  consumption for 4-5 meals, snacks, and/or supplements   Increase Strength and Stamina Yes   Intervention Develop an individualized exercise prescription for aerobic and resistive training based on initial evaluation findings, risk stratification, comorbidities and participant's personal goals.;Provide advice, education, support and counseling about physical activity/exercise needs.   Expected Outcomes Achievement of increased cardiorespiratory fitness and enhanced flexibility, muscular endurance and strength shown through measurements of functional capacity and personal statement of participant.   Diabetes Yes   Intervention Provide education about signs/symptoms and action to take for hypo/hyperglycemia.;Provide education about proper nutrition, including hydration, and aerobic/resistive exercise prescription along with prescribed medications to achieve blood glucose in normal ranges: Fasting glucose 65-99 mg/dL   Expected Outcomes Short Term: Participant verbalizes understanding of the signs/symptoms and immediate care of hyper/hypoglycemia, proper foot care and importance of medication, aerobic/resistive exercise and nutrition plan for blood glucose control.;Long Term: Attainment of HbA1C < 7%.   Hypertension Yes   Intervention Provide education on lifestyle modifcations including regular physical activity/exercise, weight management, moderate sodium restriction and increased consumption of fresh fruit, vegetables, and low fat dairy, alcohol moderation, and smoking cessation.;Monitor prescription use compliance.   Expected Outcomes Short Term: Continued assessment and intervention until BP is < 140/51mm HG in hypertensive participants. < 130/60mm HG in hypertensive participants with diabetes, heart failure or chronic kidney disease.;Long Term: Maintenance of blood pressure at goal levels.   Lipids Yes   Intervention Provide education and support for participant on nutrition & aerobic/resistive exercise  along with prescribed medications to achieve  LDL 70mg , HDL >40mg .   Expected Outcomes Long Term: Cholesterol controlled with medications as prescribed, with individualized exercise RX and with personalized nutrition plan. Value goals: LDL < 70mg , HDL > 40 mg.   Personal Goal Other Yes   Personal Goal weight loss, improve stamina and overall quality of life    Intervention individualized exercise program, education and nutritional consult   Expected Outcomes increase overal functional capacity and weight loss      Core Components/Risk Factors/Patient Goals Review:      Goals and Risk Factor Review    Row Name 01/29/16 0903 03/09/16 1105           Core Components/Risk Factors/Patient Goals Review   Personal Goals Review Weight Management/Obesity;Other;Increase Strength and Stamina  -      Review increase stamina and lose weight Pt struggles with weightloss. Referred pt to [facility] for nutrition advise.      Expected Outcomes increase overall functional capacity and improve quality of life Pt will apply dietary changes to lifestlye and be able to lose weight         Core Components/Risk Factors/Patient Goals at Discharge (Final Review):      Goals and Risk Factor Review - 03/09/16 1105      Core Components/Risk Factors/Patient Goals Review   Review Pt struggles with weightloss. Referred pt to [facility] for nutrition advise.   Expected Outcomes Pt will apply dietary changes to lifestlye and be able to lose weight      ITP Comments:     ITP Comments    Row Name 01/29/16 0858 01/29/16 1503         ITP Comments Dr. Marland Mcalpine Dr. Armanda Magic         Comments: Thayer Ohm is making expected progress toward personal goals after completing 13 sessions. Recommend continued exercise and life style modification education including  stress management and relaxation techniques to decrease cardiac risk profile. Thayer Ohm has switched to the 245 class due to his work schedule.Gladstone Lighter,  RN,BSN 03/11/2016 12:36 PM

## 2016-03-10 ENCOUNTER — Encounter (HOSPITAL_COMMUNITY)
Admission: RE | Admit: 2016-03-10 | Discharge: 2016-03-10 | Disposition: A | Discharge status: Home / Self Care | Payer: Managed Care, Other (non HMO) | Source: Ambulatory Visit | Attending: Cardiology | Admitting: Cardiology

## 2016-03-10 ENCOUNTER — Encounter (HOSPITAL_COMMUNITY): Payer: Managed Care, Other (non HMO)

## 2016-03-10 DIAGNOSIS — Z951 Presence of aortocoronary bypass graft: Secondary | ICD-10-CM

## 2016-03-12 ENCOUNTER — Encounter (HOSPITAL_COMMUNITY): Payer: Managed Care, Other (non HMO)

## 2016-03-12 ENCOUNTER — Encounter (HOSPITAL_COMMUNITY)
Admission: RE | Admit: 2016-03-12 | Discharge: 2016-03-12 | Disposition: A | Discharge status: Home / Self Care | Payer: Managed Care, Other (non HMO) | Source: Ambulatory Visit | Attending: Cardiology | Admitting: Cardiology

## 2016-03-12 DIAGNOSIS — Z951 Presence of aortocoronary bypass graft: Secondary | ICD-10-CM

## 2016-03-15 ENCOUNTER — Encounter (HOSPITAL_COMMUNITY)
Admission: RE | Admit: 2016-03-15 | Discharge: 2016-03-15 | Disposition: A | Discharge status: Home / Self Care | Payer: Managed Care, Other (non HMO) | Source: Ambulatory Visit | Attending: Cardiology | Admitting: Cardiology

## 2016-03-15 ENCOUNTER — Encounter (HOSPITAL_COMMUNITY): Payer: Managed Care, Other (non HMO)

## 2016-03-15 DIAGNOSIS — Z951 Presence of aortocoronary bypass graft: Secondary | ICD-10-CM | POA: Diagnosis not present

## 2016-03-17 ENCOUNTER — Telehealth (HOSPITAL_COMMUNITY): Payer: Self-pay | Admitting: Family

## 2016-03-17 ENCOUNTER — Encounter (HOSPITAL_COMMUNITY): Payer: Managed Care, Other (non HMO)

## 2016-03-19 ENCOUNTER — Telehealth (HOSPITAL_COMMUNITY): Payer: Self-pay | Admitting: Family

## 2016-03-19 ENCOUNTER — Encounter (HOSPITAL_COMMUNITY): Payer: Managed Care, Other (non HMO)

## 2016-03-22 ENCOUNTER — Encounter (HOSPITAL_COMMUNITY): Payer: Managed Care, Other (non HMO)

## 2016-03-22 ENCOUNTER — Telehealth (HOSPITAL_COMMUNITY): Payer: Self-pay | Admitting: Family

## 2016-03-22 NOTE — Telephone Encounter (Signed)
Pt cancelled due to having a virus still... KJ

## 2016-03-24 ENCOUNTER — Encounter (HOSPITAL_COMMUNITY): Payer: Managed Care, Other (non HMO)

## 2016-03-24 ENCOUNTER — Encounter (HOSPITAL_COMMUNITY)
Admission: RE | Admit: 2016-03-24 | Discharge: 2016-03-24 | Disposition: A | Discharge status: Home / Self Care | Payer: Managed Care, Other (non HMO) | Source: Ambulatory Visit | Attending: Cardiology | Admitting: Cardiology

## 2016-03-24 DIAGNOSIS — Z951 Presence of aortocoronary bypass graft: Secondary | ICD-10-CM

## 2016-03-26 ENCOUNTER — Encounter (HOSPITAL_COMMUNITY): Payer: Managed Care, Other (non HMO)

## 2016-03-31 ENCOUNTER — Encounter (HOSPITAL_COMMUNITY): Payer: Managed Care, Other (non HMO)

## 2016-04-02 ENCOUNTER — Encounter (HOSPITAL_COMMUNITY): Payer: Managed Care, Other (non HMO)

## 2016-04-07 ENCOUNTER — Encounter (HOSPITAL_COMMUNITY): Payer: Managed Care, Other (non HMO)

## 2016-04-08 ENCOUNTER — Telehealth (HOSPITAL_COMMUNITY): Payer: Self-pay | Admitting: *Deleted

## 2016-04-09 ENCOUNTER — Telehealth (HOSPITAL_COMMUNITY): Payer: Self-pay | Admitting: *Deleted

## 2016-04-09 ENCOUNTER — Encounter (HOSPITAL_COMMUNITY)
Admission: RE | Admit: 2016-04-09 | Discharge: 2016-04-09 | Disposition: A | Discharge status: Home / Self Care | Payer: Managed Care, Other (non HMO) | Source: Ambulatory Visit | Attending: Cardiology | Admitting: Cardiology

## 2016-04-09 ENCOUNTER — Encounter (HOSPITAL_COMMUNITY): Payer: Managed Care, Other (non HMO)

## 2016-04-09 DIAGNOSIS — Z951 Presence of aortocoronary bypass graft: Secondary | ICD-10-CM | POA: Insufficient documentation

## 2016-04-09 NOTE — Progress Notes (Signed)
Discharge Summary  Patient Details  Name: Brett Drake MRN: [id number] Date of Birth: 10/04/1970 Referring Provider:   Flowsheet Row CARDIAC REHAB PHASE II ORIENTATION from 01/29/2016 in [place]  Referring Provider  Kela Millin MD       Number of Visits: 17  Reason for Discharge:  Early Exit:  Back to work  Smoking History:  History  Smoking Status  . Former Smoker  . Quit date: 07/07/2008  Smokeless Tobacco  . 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  ADL UCSD:   Initial Exercise Prescription:     Initial Exercise Prescription - 01/29/16 1400      Date of Initial Exercise RX and Referring Provider   Date 01/29/16   Referring Provider Kela Millin MD     Treadmill   MPH 2.9   Grade 1   Minutes 10   METs 3.62     Bike   Level 1   Minutes 10   METs 2.3     NuStep   Level 2   Minutes 10   METs 2     Prescription Details   Frequency (times per week) 3   Duration Progress to 45 minutes of aerobic exercise without signs/symptoms of physical distress     Intensity   THRR 40-80% of Max Heartrate 70-140   Ratings of Perceived Exertion 11-13   Perceived Dyspnea 0-4     Progression   Progression Continue to progress workloads to maintain intensity without signs/symptoms of physical distress.     Resistance Training   Training Prescription Yes   Weight 2lb   Reps 10-12      Discharge Exercise Prescription (Final Exercise Prescription Changes):     Exercise Prescription Changes - 04/21/16 0900      Exercise Review   Progression Yes     Response to Exercise   Blood Pressure (Admit) 132/80   Blood Pressure (Exercise) 140/70   Blood Pressure (Exit) 128/80   Heart Rate (Admit) 87 bpm   Heart Rate (Exercise) 113 bpm   Heart Rate (Exit) 76 bpm   Rating of Perceived Exertion (Exercise) 11   Symptoms none   Comments Reviewed HEP on 02/13/16   Duration Progress to  30 minutes of continuous aerobic without signs/symptoms of physical distress   Intensity THRR unchanged     Progression   Average METs 3.4     Resistance Training   Training Prescription Yes   Weight 5lbs   Reps 10-12     Treadmill   MPH 2.9   Grade 3   Minutes 10   METs 4.42     Bike   Level 1.8   Minutes 10   METs 3.51     NuStep   Level 5   Minutes 10   METs 3.1     Home Exercise Plan   Plans to continue exercise at [employer] (comment)  HEP reviewd on 02/13/16. Exercise at apartment gym   Frequency Add 2 additional days to program exercise sessions.      Functional Capacity:     6 Minute Walk    Row Name 01/29/16 1437         6 Minute Walk   Phase Initial     Distance 1595 feet     Walk Time 6 minutes     MPH 3.02     METS 4.01  RPE 6     VO2 Peak 14.01     Symptoms No     Resting HR 74 bpm     Resting BP 102/70     Max Ex. HR 122 bpm     Max Ex. BP 122/60     2 Minute Post BP 120/70        Psychological, QOL, Others - Outcomes: PHQ 2/9: Depression screen [facility] 2/9 04/09/2016 02/02/2016 01/29/2016  Decreased Interest 0 0 0  Down, Depressed, Hopeless 0 0 0  PHQ - 2 Score 0 0 0    Quality of Life:     Quality of Life - 01/29/16 1439      Quality of Life Scores   Health/Function Pre 17.6 %   Socioeconomic Pre 24.5 %   Psych/Spiritual Pre 18.43 %   Family Pre 24 %   GLOBAL Pre 20.13 %      Personal Goals: Goals established at orientation with interventions provided to work toward goal.     Personal Goals and Risk Factors at Admission - 01/29/16 0858      Core Components/Risk Factors/Patient Goals on Admission    Weight Management Yes;Obesity   Intervention Weight Management: Develop a combined nutrition and exercise program designed to reach desired caloric intake, while maintaining appropriate intake of nutrient and fiber, sodium and fats, and appropriate energy expenditure required for the weight goal.   Expected  Outcomes Short Term: Continue to assess and modify interventions until short term weight is achieved;Weight Maintenance: Understanding of the daily nutrition guidelines, which includes 25-35% calories from fat, 7% or less cal from saturated fats, less than 266m cholesterol, less than 1.5gm of sodium, & 5 or more servings of fruits and vegetables daily;Weight Loss: Understanding of general recommendations for a balanced deficit meal plan, which promotes 1-2 lb weight loss per week and includes a negative energy balance of [phone] kcal/d;Understanding recommendations for meals to include 15-35% energy as protein, 25-35% energy from fat, 35-60% energy from carbohydrates, less than 2080mof dietary cholesterol, 20-35 gm of total fiber daily;Understanding of distribution of calorie intake throughout the day with the consumption of 4-5 meals/snacks;Weight Gain: Understanding of general recommendations for a high calorie, high protein meal plan that promotes weight gain by distributing calorie intake throughout the day with the consumption for 4-5 meals, snacks, and/or supplements   Increase Strength and Stamina Yes   Intervention Develop an individualized exercise prescription for aerobic and resistive training based on initial evaluation findings, risk stratification, comorbidities and participant's personal goals.;Provide advice, education, support and counseling about physical activity/exercise needs.   Expected Outcomes Achievement of increased cardiorespiratory fitness and enhanced flexibility, muscular endurance and strength shown through measurements of functional capacity and personal statement of participant.   Diabetes Yes   Intervention Provide education about signs/symptoms and action to take for hypo/hyperglycemia.;Provide education about proper nutrition, including hydration, and aerobic/resistive exercise prescription along with prescribed medications to achieve blood glucose in normal ranges:  Fasting glucose 65-99 mg/dL   Expected Outcomes Short Term: Participant verbalizes understanding of the signs/symptoms and immediate care of hyper/hypoglycemia, proper foot care and importance of medication, aerobic/resistive exercise and nutrition plan for blood glucose control.;Long Term: Attainment of HbA1C < 7%.   Hypertension Yes   Intervention Provide education on lifestyle modifcations including regular physical activity/exercise, weight management, moderate sodium restriction and increased consumption of fresh fruit, vegetables, and low fat dairy, alcohol moderation, and smoking cessation.;Monitor prescription use compliance.   Expected Outcomes Short Term: Continued assessment  and intervention until BP is < 140/20m HG in hypertensive participants. < 130/845mHG in hypertensive participants with diabetes, heart failure or chronic kidney disease.;Long Term: Maintenance of blood pressure at goal levels.   Lipids Yes   Intervention Provide education and support for participant on nutrition & aerobic/resistive exercise along with prescribed medications to achieve LDL <7029mHDL >51m51m Expected Outcomes Long Term: Cholesterol controlled with medications as prescribed, with individualized exercise RX and with personalized nutrition plan. Value goals: LDL < 70mg26mL > 40 mg.   Personal Goal Other Yes   Personal Goal weight loss, improve stamina and overall quality of life    Intervention individualized exercise program, education and nutritional consult   Expected Outcomes increase overal functional capacity and weight loss       Personal Goals Discharge:     Goals and Risk Factor Review    Row Name 01/29/16 0903 03/09/16 1105 04/21/16 1351         Core Components/Risk Factors/Patient Goals Review   Personal Goals Review Weight Management/Obesity;Other;Increase Strength and Stamina  - Weight Management/Obesity     Review increase stamina and lose weight Pt struggles with weightloss.  [facility] his wt while in the program     Expected Outcomes increase overall functional capacity and improve quality of life Pt will apply dietary changes to lifestlye and be able to lose weight Pt will apply dietary changes to lifestlye and be able to lose weight        Nutrition & Weight - Outcomes:     Pre Biometrics - 01/29/16 1439      Pre Biometrics   Waist Circumference 56.75 inches   Hip Circumference 53 inches   Waist to Hip Ratio 1.07 %   Triceps Skinfold 30 mm   % Body Fat 42.7 %   Grip Strength 53 kg   Flexibility 11.5 in   Single Leg Stand 30 seconds         Post Biometrics - 04/21/16 0902       Post  Biometrics   Height 5' 11"  (1.803 m)   Weight (!)  301 lb (136.5 kg)   BMI (Calculated) 42.1      Nutrition:     Nutrition Therapy & Goals - 01/29/16 1413      Nutrition Therapy   Diet Carb Modified, Therapeutic Lifestyle Change     Personal Nutrition Goals   Personal Goal #1 1-2 lb wt loss/week to a wt loss goal of 6-24 lb at graduation from [facility] and counsel regarding individualized specific dietary modifications aiming towards targeted core components such as weight, hypertension, lipid management, diabetes, heart failure and other comorbidities.   Expected Outcomes Short Term Goal: Understand basic principles of dietary content, such as calories, fat, sodium, cholesterol and nutrients.;Long Term Goal: Adherence to prescribed nutrition plan.      Nutrition Discharge:     Nutrition Assessments - 01/29/16 1413      MEDFICTS Scores   Pre Score 21      Education Questionnaire Score:     Knowledge Questionnaire Score - 01/29/16 1437      Knowledge Questionnaire Score   Pre Score 23/24      Goals reviewed with patient; copy given to patient.ChrisGerald Stabsuated from cardiac rehab program today with completion of 17 exercise sessions in  Phase II. Chris maintained good attendance and progressed nicely  during his participation in rehab as evidenced by increased MET level.   Medication list reconciled. Repeat  PHQ score-  0.  Pt has made significant lifestyle changes and should be commended for his success. Pt feels he has achieved his goals during cardiac rehab.   Gerald Stabs plans to continue exercise on his own Gerald Stabs last day of exercise was on 03/24/2016. Gerald Stabs will not be able to return to exercise due to his work schedule.Barnet Pall, RN,BSN 04/29/2016 2:42 PM

## 2016-04-12 ENCOUNTER — Encounter (HOSPITAL_COMMUNITY): Payer: Managed Care, Other (non HMO)

## 2016-04-14 ENCOUNTER — Encounter (HOSPITAL_COMMUNITY): Payer: Managed Care, Other (non HMO)

## 2016-04-16 ENCOUNTER — Encounter (HOSPITAL_COMMUNITY): Payer: Managed Care, Other (non HMO)

## 2016-04-19 ENCOUNTER — Encounter (HOSPITAL_COMMUNITY): Payer: Managed Care, Other (non HMO)

## 2016-04-21 ENCOUNTER — Encounter (HOSPITAL_COMMUNITY): Payer: Managed Care, Other (non HMO)

## 2016-04-21 NOTE — Addendum Note (Signed)
Encounter addended by: Ples SpecterAshley L Senaya Dicenso on: 04/21/2016  9:03 AM<BR>    Actions taken: Flowsheet data copied forward, Flowsheet accepted, Visit Navigator Flowsheet section accepted

## 2016-04-21 NOTE — Addendum Note (Signed)
Encounter addended by: Jacques EarthlyEdna Brewbaker Hans Rusher, RD on: 04/21/2016  1:54 PM<BR>    Actions taken: Flowsheet data copied forward, Visit Navigator Flowsheet section accepted

## 2016-04-23 ENCOUNTER — Encounter (HOSPITAL_COMMUNITY): Payer: Managed Care, Other (non HMO)

## 2016-04-26 ENCOUNTER — Encounter (HOSPITAL_COMMUNITY): Payer: Managed Care, Other (non HMO)

## 2016-04-28 ENCOUNTER — Encounter (HOSPITAL_COMMUNITY): Payer: Managed Care, Other (non HMO)

## 2016-04-30 ENCOUNTER — Encounter (HOSPITAL_COMMUNITY): Payer: Managed Care, Other (non HMO)

## 2016-05-03 ENCOUNTER — Encounter (HOSPITAL_COMMUNITY): Payer: Managed Care, Other (non HMO)

## 2016-05-05 ENCOUNTER — Encounter (HOSPITAL_COMMUNITY): Payer: Managed Care, Other (non HMO)

## 2016-05-07 ENCOUNTER — Encounter (HOSPITAL_COMMUNITY): Payer: Managed Care, Other (non HMO)

## 2016-05-10 ENCOUNTER — Encounter (HOSPITAL_COMMUNITY): Payer: Managed Care, Other (non HMO)

## 2016-05-12 ENCOUNTER — Encounter (HOSPITAL_COMMUNITY): Payer: Managed Care, Other (non HMO)

## 2016-05-14 ENCOUNTER — Encounter (HOSPITAL_COMMUNITY): Payer: Managed Care, Other (non HMO)

## 2016-05-17 ENCOUNTER — Encounter (HOSPITAL_COMMUNITY): Payer: Managed Care, Other (non HMO)

## 2016-05-19 ENCOUNTER — Encounter (HOSPITAL_COMMUNITY): Payer: Managed Care, Other (non HMO)

## 2016-05-21 ENCOUNTER — Encounter (HOSPITAL_COMMUNITY): Payer: Managed Care, Other (non HMO)

## 2016-05-24 ENCOUNTER — Encounter (HOSPITAL_COMMUNITY): Payer: Managed Care, Other (non HMO)

## 2016-05-26 ENCOUNTER — Encounter (HOSPITAL_COMMUNITY): Payer: Managed Care, Other (non HMO)

## 2016-05-28 ENCOUNTER — Encounter (HOSPITAL_COMMUNITY): Payer: Managed Care, Other (non HMO)

## 2016-05-31 ENCOUNTER — Encounter (HOSPITAL_COMMUNITY): Payer: Managed Care, Other (non HMO)

## 2016-09-13 ENCOUNTER — Encounter: Payer: Self-pay | Admitting: Neurology

## 2016-09-15 ENCOUNTER — Ambulatory Visit (INDEPENDENT_AMBULATORY_CARE_PROVIDER_SITE_OTHER): Payer: Managed Care, Other (non HMO) | Admitting: Neurology

## 2016-09-15 ENCOUNTER — Encounter: Payer: Self-pay | Admitting: Neurology

## 2016-09-15 ENCOUNTER — Other Ambulatory Visit: Payer: Self-pay | Admitting: Neurology

## 2016-09-15 VITALS — BP 122/79 | HR 69 | Ht 71.0 in | Wt 309.5 lb

## 2016-09-15 DIAGNOSIS — G4733 Obstructive sleep apnea (adult) (pediatric): Secondary | ICD-10-CM

## 2016-09-15 DIAGNOSIS — R52 Pain, unspecified: Secondary | ICD-10-CM

## 2016-09-15 DIAGNOSIS — Z9989 Dependence on other enabling machines and devices: Secondary | ICD-10-CM

## 2016-09-15 DIAGNOSIS — R202 Paresthesia of skin: Secondary | ICD-10-CM | POA: Diagnosis not present

## 2016-09-15 NOTE — Patient Instructions (Addendum)
I am not sure how to explain your variable tingling sensation.   You are at risk for TIA and for diabetic neuropathy. We will do work up in the form of CT head, EEG and EMG.  We will call you with your CT results.  We will do an EEG (brainwave test), which we will schedule. We will call you with the results. We will do an EMG and nerve conduction velocity test, which is an electrical nerve and muscle test, which we will schedule. We will call you with the results.  Please continue using your CPAP regularly. While your insurance requires that you use CPAP at least 4 hours each night on 70% of the nights, I recommend, that you not skip any nights and use it throughout the night if you can. Getting used to CPAP and staying with the treatment long term does take time and patience and discipline. Untreated obstructive sleep apnea when it is moderate to severe can have an adverse impact on cardiovascular health and raise her risk for heart disease, arrhythmias, hypertension, congestive heart failure, stroke and diabetes. Untreated obstructive sleep apnea causes sleep disruption, nonrestorative sleep, and sleep deprivation. This can have an impact on your day to day functioning and cause daytime sleepiness and impairment of cognitive function, memory loss, mood disturbance, and problems focussing. Using CPAP regularly can improve these symptoms.  For now, we will change your appointment for sleep apnea for 1 year from now, and we may add another appointment as needed in between.

## 2016-09-15 NOTE — Progress Notes (Signed)
Subjective:    Patient ID: Brett Drake is a 46 y.o. male.  HPI     Interim history:   Mr. Trott is a 47 year old right-handed gentleman with an underlying medical history of type 2 diabetes, chronic combined systolic and diastolic heart failure, ischemic cardiomyopathy, morbid obesity, and coronary artery disease, status post MI in 2010 at age 9, status post pacemaker/defibrillator placement in 2013 and s/p cardiac stent placements, who is referred by his endocrinologist for a new problem of paresthesias. I have previously seen him for obstructive sleep apnea and last saw him on 12/04/2014 and he canceled an appointment for 12/04/2015 for his 1 year checkup. He had a left heart cath under Dr. Einar Gip on 11/28/2015. He ended up having a 2 vessed CABG in 9/17.   Today, 09/15/2016 (all dictated new, as well as above notes, some dictation done in note pad or Word, outside of chart, may appear as copied):  He reports that for the past 2 months, he has had tingling in different places. Sometimes it is a painful pins and needles like sensation, no actual numbness reported, no weakness. Sometimes his right leg or left leg feel heavy. He has sparing of the neck and upper shoulder area and face, in particular, denies one-sided weakness, one-sided numbness, droopy face or slurring of speech or recurrent headaches. He has no complaints about the CPAP, is compliant with CPAP, compliance percentage between 08/15/2016 and 09/13/2016 is 97%, AHI at goal at 1.6 per hour on average, leak acceptable, pressure still at 11 cm with EPR of 3. Of note, he had some itching sensation at the scars where he had drainage inserted for his surgery. He had open heart surgery in September 2017. He had a two-vessel CABG. His A1c recently was 8.8. He admits that he was drinking sodas. His weight has been fluctuating. He currently has a tingling sensation across the left abdominal area. He denies any weakness or tingling in his  feet. He has had no balance problems or falls.  The patient's allergies, current medications, family history, past medical history, past social history, past surgical history and problem list were reviewed and updated as appropriate.   Previously (copied from previous notes for reference):    I reviewed his CPAP compliance data from 11/03/2015 through 12/02/2015 which is a total of 30 days, during which time he used his machine every night with percent used days greater than 4 hours at 97%, indicating excellent compliance with an average usage of 6 hours and 46 minutes, residual AHI 3 per hour, leak low with the 95th percentile at 2.5 L/m on a pressure of 11 cm with EPR of 3.  I saw him on 08/29/2014, at which time we talked about the sleep test results and his compliance data with CPAP therapy. He reported that he had some difficulty adjusting to CPAP therapy but he was getting better. He had used CPAP fairly consistently in the previous 2 weeks and preferred the nasal mask. He felt better rested. He felt that he had more daytime energy and overall was quite pleased with how he was doing. He was able to lose some weight. He was scheduled for a colonoscopy soon. His hemoglobin A1c had come down from 11 to 8.8. I encouraged him to be fully compliant with treatment. I commended him for trying and the fact that he had overall done better than in the past when he was tried on CPAP.   I reviewed his CPAP compliance data from 11/03/2014  through 12/02/2014 which is a total of 30 days during which time he used his machine every night with percent used days greater than 4 hours at 90%, indicating excellent compliance with an average usage of 5 hours and 47 minutes, residual AHI at 3.4 per hour, leak low with the 95th percentile at 4.7 L/m on a pressure of 11 cm with EPR of 3.   I first met him on 05/06/2014 at the request of his cardiologist, at which time he reported a prior diagnosis of OSA but intolerance to  CPAP in the past. He reported weight gain. He had also had an abnormal overnight pulse oximetry test through his cardiologist's which I reviewed at the time. I invited him back for sleep study. He had a split-night sleep study on 06/24/2014 and went over his test results with him in detail today. His baseline sleep efficiency of was reduced at 65.4% with a latency to sleep of 16.5 minutes and wake after sleep onset of 26 minutes with moderate sleep fragmentation noted. He had absence of slow-wave sleep and absence of REM sleep during the baseline portion of the study. He had frequent PVCs and PACs on EKG. He had moderate snoring. Total AHI was highly elevated at 100.6 per hour. Average oxygen saturation was 90%, nadir was 70%. He was therefore titrated on CPAP during the later portion of the study. His arousal index improved. He achieved slow-wave sleep and REM sleep. Average oxygen saturations improved to 94%, nadir was 85%. He was titrated on CPAP from 5 cm to 10 cm of water pressure, AHI was reduced to 5.9 events per hour at the final pressure with supine REM sleep achieved. Based on the test results I prescribed CPAP therapy for home use at a pressure of 11 cm due to residual sleep disordered breathing noted on the final pressure of 10 cm.   I reviewed his CPAP compliance data from 07/29/2014 through 08/27/2014 which is a total of 30 days during which time he used his machine 27 days with percent used days rated and 4 hours at 47%, indicating suboptimal compliance with an average usage for all nights of 3 hours and 24 minutes only. Residual AHI good at 2.6 per hour with leak low at 2.2 L/m for the 95th percentile and a pressure of 11 cm with EPR of 2. It does look like he has increased his CPAP usage in the last 2 weeks.     He has a history of snoring and reports daytime somnolence. He had an overnight pulse oximetry test on 04/22/2014: Total test time was 6 hours and 37 minutes, average oxygen saturation  91.3%, lowest oxygen saturation 50%, time below 88% saturation was 85.4 minutes.   He moved here from [place]. He tells me that he was actually diagnosed with obstructive sleep apnea with a sleep study over a year ago when he was still residing in [place]. He does not have the actual test results. He was tried on CPAP at night. He had trouble tolerating it but would be willing to come back for diagnosis and treatment with another sleep study. He estimates that his sleep study was well over a year ago. He has gained a lot of weight in the last year because of increase in his insulin dose. He has had trouble with diabetes control. He's had diabetes for about 14 years but thankfully does not endorse any complications from diabetes. He has not seen an ophthalmologist in over a year. He had a  recent echocardiogram which he reports showed an EF of 50%. His echocardiogram from August 2014 showed an EF of 35%. He does not endorse any chest pain or shortness of breath at this time. He endorses loud snoring, gasping sensations while asleep, witnessed apneic pauses and frequent morning headaches.   Her typical bedtime is reported to be around 8 to 9 PM and usual wake time is around 4:30 AM. Sleep onset typically occurs within minutes. He reports feeling poorly rested upon awakening. He wakes up on an average 3 to 4 times in the middle of the night and has to go to the bathroom 3 to 4 times on a typical night. He admits to frequent morning headaches. He has to be at work at 5 AM. He works as an Sales promotion account executive at [employer]   He reports excessive daytime somnolence (EDS) and His Epworth Sleepiness Score (ESS) is 18/24 today. He has fallen asleep while driving, in the past, on longer distances. He knows to stop and pull over if he feels sleepy at the wheel. He definitely falls asleep when he is a passenger. He does not take any scheduled. He suspects that his father has sleep apnea but he has not been formally  tested. He has a family history of heart disease in his father had heart transplant.   He drinks unsweet tea maybe a glass a day. He drinks alcohol maybe at the most once per month. He quit smoking on 07/07/2008 when he had his heart attack. He had 3 coronary stents placed on 07/08/2008. He denies cataplexy, sleep paralysis, hypnagogic or hypnopompic hallucinations, or sleep attacks. He does not report any vivid dreams, nightmares, dream enactments, or parasomnias, such as sleep walking but does report some sleep talking.    His wife and 87 yo stepdaughter are still in [place]. He does not have a TV in his bedroom.   His Past Medical History Is Significant For: Past Medical History:  Diagnosis Date  . AICD (automatic cardioverter/defibrillator) present   . Anxiety   . Atherosclerosis   . Cardiac defibrillator in place   . CHF (congestive heart failure) ([place])   . Coronary artery disease   . Depression   . Diabetes mellitus without complication ([place])    type ll,uncontrolled with renal complications  . GERD (gastroesophageal reflux disease)   . Headache   . Heart failure ([facility])    chronic combined systolic and diastolic  . History of bronchitis   . History of pneumonia 2016  . Hyperlipemia   . Hypertension    essential  . Ischemic cardiomyopathy   . Morbid obesity ([place])   . Myocardial infarction ([place]) 07/07/2008  . Presence of permanent cardiac pacemaker   . Sleep apnea   . Urinary frequency    "due to medication"  . Wears glasses     His Past Surgical History Is Significant For: Past Surgical History:  Procedure Laterality Date  . CARDIAC CATHETERIZATION N/A 11/28/2015   Procedure: Left Heart Cath and Coronary Angiography;  Surgeon: [name], MD;  Location: [facility];  Service: Cardiovascular;  Laterality: N/A;  . CARDIAC CATHETERIZATION N/A 11/28/2015   Procedure: Intravascular Pressure Wire/FFR Study;  Surgeon: [name], MD;  Location: [facility];  Service:  Cardiovascular;  Laterality: N/A;  . COLONOSCOPY    . CORONARY ARTERY BYPASS GRAFT Bilateral 12/19/2015   Procedure: coronary artery bypass graft times two  using left internal mammary artery and endoscopic left saphenous vein harvest;  Surgeon: Gaye Pollack, MD;  Location: [facility];  Service: Open Heart Surgery;  Laterality: Bilateral;  . EYE SURGERY Left    age 29,to correct lazy eye  . FINGER SURGERY Left    middle finger  . MASTECTOMY Bilateral 1985   gynecomastia  . PACEMAKER INSERTION  2013   St Jude,implantable defibrillator  . stents  07/08/2008   3   . TEE WITHOUT CARDIOVERSION N/A 12/19/2015   Procedure: TRANSESOPHAGEAL ECHOCARDIOGRAM (TEE);  Surgeon: Gaye Pollack, MD;  Location: [facility];  Service: Open Heart Surgery;  Laterality: N/A;    His Family History Is Significant For: Family History  Problem Relation Age of Onset  . Heart Problems Father        transplant at age 72  . Kidney failure Father   . Dementia Father     His Social History Is Significant For: Social History   Social History  . Marital status: Married    Spouse name: Judson Roch  . Number of children: 1  . Years of education: college   Occupational History  . 1    Social History Main Topics  . Smoking status: Former Smoker    Quit date: 07/07/2008  . Smokeless tobacco: Never Used     Comment: quit in 2010  . Alcohol use 0.0 oz/week     Comment: rarely  . Drug use: No  . Sexual activity: Not Asked   Other Topics Concern  . None   Social History Narrative  . None    His Allergies Are:  Allergies  Allergen Reactions  . Sulfa Antibiotics Swelling and Rash    SWELLING REACTION UNSPECIFIED   . Penicillins Rash  :   His Current Medications Are:  Outpatient Encounter Prescriptions as of 09/15/2016  Medication Sig  . aspirin 325 MG tablet Take 325 mg by mouth daily.  . carvedilol (COREG) 12.5 MG tablet Take 1 tablet (12.5 mg total) by mouth 2 (two) times daily with a meal. (Patient taking  differently: Take 25 mg by mouth 2 (two) times daily with a meal. )  . clopidogrel (PLAVIX) 75 MG tablet Take 75 mg by mouth daily.  Marland Kitchen FLUoxetine (PROZAC) 40 MG capsule Take 40 mg by mouth daily.  . furosemide (LASIX) 40 MG tablet Take 40 mg by mouth daily.   Marland Kitchen HUMALOG KWIKPEN 200 UNIT/ML SOPN Inject 25-30 Units into the skin 3 (three) times daily before meals. Take 25 units with breakfast & lunch Take 30 units with dinner  . Insulin Degludec (TRESIBA FLEXTOUCH) 200 UNIT/ML SOPN Inject 80 Units into the skin at bedtime.   . lansoprazole (PREVACID) 15 MG capsule Take 15 mg by mouth at bedtime.   Marland Kitchen losartan (COZAAR) 25 MG tablet Take 25 mg by mouth daily.  . metFORMIN (GLUCOPHAGE) 500 MG tablet Take 1-2 tablets (500-1,000 mg total) by mouth 2 (two) times daily with a meal. Take 500 mg every morning  Take 1000 mg at bedtime  . Multiple Vitamin (MULTI-VITAMIN PO) Take 1 tablet by mouth daily.  Marland Kitchen oxymetazoline (AFRIN) 0.05 % nasal spray Place 1 spray into both nostrils 2 (two) times daily.   . potassium chloride SA (K-DUR,KLOR-CON) 20 MEQ tablet Take 1 tablet (20 mEq total) by mouth daily.  . rosuvastatin (CRESTOR) 40 MG tablet Take 40 mg by mouth daily.   No facility-administered encounter medications on file as of 09/15/2016.   :  Review of Systems:  Out of a complete 14 point review of systems, all are  reviewed and negative with the exception of these symptoms as listed below:   Review of Systems  Neurological:       Pt presents today with a new complaint of numbness that migrates all over. Pt compliantly using CPAP and has no complaints regarding CPAP.   Objective:  Neurologic Exam  Physical Exam Physical Examination:   Vitals:   09/15/16 1115  BP: 122/79  Pulse: 69    General Examination: The patient is a very pleasant 46 y.o. male in no acute distress. He appears well-developed and well-nourished and adequately groomed.   HEENT: Normocephalic, atraumatic, pupils are equal,  round and reactive to light and accommodation. Extraocular tracking is good without limitation to gaze excursion or nystagmus noted. Normal smooth pursuit is noted. Hearing is grossly intact. Face is symmetric with normal facial animation and normal facial sensation. Speech is clear with no dysarthria noted. There is no hypophonia. There is no lip, neck/head, jaw or voice tremor. Neck is supple with full range of passive and active motion. There are no carotid bruits on auscultation. Oropharynx exam reveals: mild to moderate mouth dryness, adequate dental hygiene and moderate airway crowding. Mallampati is class III. Tongue protrudes centrally and palate elevates symmetrically.    Chest: Clear to auscultation without wheezing, rhonchi or crackles noted. Sternotomy scare and 3 smaller scars under the sternum.   Heart: S1+S2+0, regular and normal without murmurs, rubs or gallops noted.   Abdomen: Soft, non-tender and non-distended with normal bowel sounds appreciated on auscultation.  Extremities: There is no pitting edema in the distal lower extremities bilaterally. Pedal pulses are intact.  Skin: Warm and dry without trophic changes noted. There are some varicose veins.  Musculoskeletal: exam reveals no obvious joint deformities, tenderness or joint swelling or erythema.   Neurologically:  Mental status: The patient is awake, alert and oriented in all 4 spheres. His immediate and remote memory, attention, language skills and fund of knowledge are appropriate. There is no evidence of aphasia, agnosia, apraxia or anomia. Speech is clear with normal prosody and enunciation. Thought process is linear. Mood is normal and affect is normal.  Cranial nerves II - XII are as described above under HEENT exam. In addition: shoulder shrug is normal with equal shoulder height noted.  Motor exam: Normal bulk, strength and tone is noted. There is no drift, tremor or rebound. Romberg is negative. Reflexes are  2+ in the UEs and 1+ in the LEs. Fine motor skills and coordination: intact with normal finger taps, normal hand movements, normal rapid alternating patting, normal foot taps and normal foot agility.  Cerebellar testing: No dysmetria or intention tremor on finger to nose testing. Heel to shin is unremarkable bilaterally. There is no truncal or gait ataxia.  Sensory exam: intact to light touch, PP, temp and vibration in the upper and lower extremities.  Gait, station and balance: He stands easily. No veering to one side is noted. No leaning to one side is noted. Posture is age-appropriate and stance is narrow based. Gait shows normal stride length and normal pace. No problems turning are noted. He turns en bloc. Tandem walk is unremarkable.             Assessment and Plan:  In summary, Devan Danzer is a very pleasant 46 year old male with an underlying medical history of type 2 diabetes since about age 65 with suboptimal control currently, chronic combined systolic and diastolic heart failure, ischemic cardiomyopathy, morbid obesity, and coronary artery disease, status post MI  in 2010 at age 67, status post pacemaker/defibrillator placement in 2013 and s/p cardiac stent placements, and s/p 2 vessel CABG on 12/19/15, severe OSA on treatment with CPAP at 11 cm with excellent compliance, who presents for a new problem of intermittent paresthesias affecting different parts of his body at different times, short-lived, usually a few seconds at a time, happening for the past couple of months or so maybe 6 weeks per his estimate. Symptoms spare the head and neck area. I'm not sure how to explain his symptoms, on examination he has a nonfocal exam, however, given his medical history, he has risk factors for TIA and neuropathy, particularly diabetic neuropathy and I would like to proceed with further testing including head CT, EMG and nerve conduction testing of both lower extremities and EEG testing. We will keep  him posted as to his test results, if results are benign he can follow-up routinely for his sleep apnea in a year from now, we will move up his appointment or add an additional appointment in the interim if needed. I answered all his questions today and he was in agreement with the plan. I spent 30 minutes in total face-to-face time with the patient, more than 50% of which was spent in counseling and coordination of care, reviewing test results, reviewing medication and discussing or reviewing the diagnosis of paresthesias, the prognosis and treatment options. Pertinent laboratory and imaging test results that were available during this visit with the patient were reviewed by me and considered in my medical decision making (see chart for details).

## 2016-09-17 ENCOUNTER — Ambulatory Visit
Admission: RE | Admit: 2016-09-17 | Discharge: 2016-09-17 | Disposition: A | Discharge status: Home / Self Care | Payer: Managed Care, Other (non HMO) | Source: Ambulatory Visit | Attending: Neurology | Admitting: Neurology

## 2016-09-17 DIAGNOSIS — R52 Pain, unspecified: Secondary | ICD-10-CM

## 2016-09-17 DIAGNOSIS — R202 Paresthesia of skin: Secondary | ICD-10-CM

## 2016-09-20 ENCOUNTER — Telehealth: Payer: Self-pay | Admitting: Neurology

## 2016-09-20 NOTE — Telephone Encounter (Signed)
I called pt with CT scan results. No answer. LVM for pt to call me back

## 2016-09-20 NOTE — Telephone Encounter (Signed)
Pt returned call and received his normal CT scan results. Had no questions. Pt verbalizes understanding

## 2016-09-20 NOTE — Progress Notes (Signed)
Please call and advise the patient that the recent scan we did was within normal limits. We did a head CT without contrast, which showed normal for age findings. No further action is required on this test at this time. Please remind patient to keep any upcoming appointments or tests and to call us with any interim questions, concerns, problems or updates. Thanks,  Huston FoleySaima Evelyna Folker, MD, PhD

## 2016-09-20 NOTE — Telephone Encounter (Signed)
-----   Message from Huston FoleySaima Athar, MD sent at 09/20/2016  7:42 AM EDT ----- Please call and advise the patient that the recent scan we did was within normal limits. We did a head CT without contrast, which showed normal for age findings. No further action is required on this test at this time. Please remind patient to keep any upcoming appointments or tests and to call us with any interim questions, concerns, problems or updates. Thanks,  Huston FoleySaima Athar, MD, PhD

## 2016-10-01 ENCOUNTER — Ambulatory Visit (INDEPENDENT_AMBULATORY_CARE_PROVIDER_SITE_OTHER): Payer: Managed Care, Other (non HMO) | Admitting: Cardiology

## 2016-10-01 VITALS — BP 139/92 | HR 94 | Ht 71.0 in | Wt 314.0 lb

## 2016-10-01 DIAGNOSIS — I255 Ischemic cardiomyopathy: Secondary | ICD-10-CM

## 2016-10-01 DIAGNOSIS — I4729 Other ventricular tachycardia: Secondary | ICD-10-CM

## 2016-10-01 DIAGNOSIS — I10 Essential (primary) hypertension: Secondary | ICD-10-CM

## 2016-10-01 DIAGNOSIS — Z95810 Presence of automatic (implantable) cardiac defibrillator: Secondary | ICD-10-CM

## 2016-10-01 DIAGNOSIS — Z6841 Body Mass Index (BMI) 40.0 and over, adult: Secondary | ICD-10-CM

## 2016-10-01 DIAGNOSIS — I5042 Chronic combined systolic (congestive) and diastolic (congestive) heart failure: Secondary | ICD-10-CM

## 2016-10-01 DIAGNOSIS — I472 Ventricular tachycardia: Secondary | ICD-10-CM | POA: Diagnosis not present

## 2016-10-01 DIAGNOSIS — I2510 Atherosclerotic heart disease of native coronary artery without angina pectoris: Secondary | ICD-10-CM | POA: Diagnosis not present

## 2016-10-01 DIAGNOSIS — E785 Hyperlipidemia, unspecified: Secondary | ICD-10-CM | POA: Diagnosis not present

## 2016-10-01 NOTE — Patient Instructions (Addendum)
No changes at present time   Your physician recommends that you schedule a follow-up appointment in OCT 2018 [facility].

## 2016-10-01 NOTE — Progress Notes (Signed)
PCP: [name], [name], NP  CT Surgeon - Dr. Laneta Simmers  Clinic Note: Chief Complaint  Patient presents with  . New Evaluation    Transfer of Cardiology Care   . Coronary Artery Disease    a/p CABG  . Cardiomyopathy    Ischemic    HPI: Brett Drake is a 46 y.o. male with a PMH below who presents today for establishing cardiology care (transfer from Dr. Nadara Eaton) -- was just seen last week.    He was originally patient of Dr. Jeanella Cara. With history of diabetes, hypertension and hyperlipidemia as well as severe OSA on CPAP.  He had a long history of coronary disease with PCI to the occluded RCA in April 2010 in [place] when he presented with an acute MI. This is only had an ICD placed in 2013 for similar mouth daily a 35% and inducible VT.  Because of abnormal thoracic impedance with his ICD, he underwent nuclear stress test read as high risk with a very large severe defect in inferior inferolateral and inferior wall. EF was estimated 31%. Inferior akinesis. Cardiac catheterization revealing severe CAD with CTO of RCA & OM1 with severe LAD, Cx  & RI disease -> CABG.  Brett Drake was last seen About 2 weeks ago by Dr. Jacinto Halim. He is not aware of having had any post-CABG EF assessment.  A1c as of Oct 2017 had improved to 6.5 from 8.3.  Recent Hospitalizations: none  Studies Personally Reviewed - (if available, images/films reviewed: From Epic Chart or Care Everywhere)  11/2015: High risk Myoview (Dr. Jacinto Halim): Very large, severe defect in inferior-inferolateral and apical wall consistent with scar and moderate peri-infarct ischemia involving the lateral wall. Also moderate-sized anterior wall scar from base towards apex with very mild peri-infarct ischemia. EF 31%. Marked global kinesis and anterior akinesis.  Transthoracic echo 11/06/2015: EF 50-55%? ->     Cardiac CATH 11/28/2015: (Dr. Jacinto Halim) EF 35% with global hypokinesis/inferior akinesis. pRCA 100% CTO (extensive L-R  collaterals). LM - mild Dz. LAD: prox 70-80% (b4 D1) - FFR 0.77. m-dLAD mild diffuse CAD, Small D1 ost 90% diffuse disease. ostRI 90% (small). ostCx - 80-90% (relative a small caliber, mod diffuse D),  Major OM1 subTO prox.    CABG x2 12/19/2015: (Dr. Laneta Simmers - LIMA-LAD, SVG-RI); Cx & rPDA not amenable for bypass. Extensive inferior scar.  Intra-Op TEE showed EF of 35% with dilated LV, global hypokinesis and inferior akinesis. Trivial MR. Post CABG TEE views showed improved LV function.  Interval History: Brett Drake presents here today for establishing cardiology care. He apparently had initially try to get in to see Dr. Nicholes Mango, but was felt to not have a significant enough cardiac myopathy to warrant heart failure clinic. Therefore ended up going to see Dr. Jacinto Halim. He was following with Dr. Jacinto Halim and had a stress test done because of abnormal thoracic impedance and eventually ended up getting for CABG. Interesting that, he really didn't notice that much in the way of significant symptoms at that time. Now he is recovered from his surgery, doing relatively well. He graduated from cardiac rehabilitation is still doing some exercises at the gym, but not to the extent he was doing with cardiac rehabilitation. His weight is been going up and down, but he really denies any significant heart failure symptoms of PND, orthopnea with only mild edema - L>>R. He doesn't indicate having had any abnormal ICD recordings of height of thoracic impedance levels going down.  He has not had any  chest tightness or pressure with rest or exertion. He does get exertional dyspnea, but he acknowledges that he is obese and deconditioned. He denies any symptoms of palpitations, lightheadedness, dizziness, weakness or syncope/near syncope. No TIA/amaurosis fugax symptoms. No melena, hematochezia, hematuria, or epstaxis. No claudication.  ROS: A comprehensive was performed. Review of Systems  Constitutional: Negative for  malaise/fatigue and weight loss (His weights go up and down. He is a hard time losing his weight).  HENT: Negative for nosebleeds.   Respiratory: Negative for cough and wheezing.   Cardiovascular: Negative.        He doesn't have chest pain, just a popping sensation of the sternotomy site.  Gastrointestinal: Negative for blood in stool and melena.  Genitourinary: Negative for hematuria.  Musculoskeletal: Positive for joint pain (knees & hips). Negative for myalgias.  Neurological: Positive for tingling (Bilateral legs and feet noticing "pins and needles "sensation is there all the time. It's not necessarily in each location all time, his tends to vary from side to side and other location.). Negative for dizziness.  Psychiatric/Behavioral: Negative for depression and memory loss. The patient does not have insomnia.   All other systems reviewed and are negative.   I have reviewed and (if needed) personally updated the patient's problem list, medications, allergies, past medical and surgical history, social and family history.   Past Medical History:  Diagnosis Date  . AICD (automatic cardioverter/defibrillator) present 2013   Placed for EF of 35% with inducible VT in 2013 (in [place])  . Anxiety   . Atherosclerosis   . Cardiac defibrillator in place   . Chronic combined systolic and diastolic CHF, NYHA class 2 and ACA/AHA stage C (HCC)    chronic combined systolic and diastolic  . Coronary artery disease involving native coronary artery of native heart    ? PRE 2017; 11/2015 - Cath with MV CAD: 100% pRCA, 80% pLAD, 80-90% oCx & oRI, subTO Om1. --> CABG  . Depression   . 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 GERD (gastroesophageal reflux disease)   . Headache   . History of bronchitis   . History of pneumonia 2016  . Hyperlipemia   . Hypertension    essential  . Ischemic cardiomyopathy    EF ~35%. Previously diagnosed, but  referred for CABG 11/2015.  . Morbid obesity (HCC)   . Myocardial infarction (HCC) 07/07/2008   In [place]: Acute inferior STEMI with PCI RCA was occluded)  . Sleep apnea   . Urinary frequency    "due to medication"  . Wears glasses     Past Surgical History:  Procedure Laterality Date  . CARDIAC CATHETERIZATION N/A 11/28/2015   Procedure: Left Heart Cath and Coronary Angiography;  Surgeon: [name], MD;  Location: [facility] INVASIVE CV LAB: EF 35% with global hypokinesis/inferior akinesis. pRCA 100% CTO (extensive L-R collaterals). LM - mild Dz. LAD: prox 70-80% (b4 D1) - FFR 0.77. m-dLAD mild diffuse CAD, Small D1 ost 90% diffuse disease. ostRI 90% (small). ostCx - 80-90% (mod diffuse D),  Major OM1 subTO prox.   Marland Kitchen CARDIAC CATHETERIZATION N/A 11/28/2015   Procedure: Intravascular Pressure Wire/FFR Study;  Surgeon: [name], MD;  Location: [facility] INVASIVE CV LAB: pLAD 70-80%, FFR 0.77  . COLONOSCOPY    . CORONARY ARTERY BYPASS GRAFT Bilateral 12/19/2015   Procedure: CORONARY ARTERY BYPASS GRAFTING TIMES 2 (CABG X 2 LIMA-LAD, SVG-RI) - using left internal mammary artery and endoscopic left saphenous vein harvest;  Surgeon: Alleen Borne, MD;  Location: [facility] OR;  Service: Open Heart Surgery;  Laterality: Bilateral;  . EYE SURGERY Left    age 26,to correct lazy eye  . FINGER SURGERY Left    middle finger  . MASTECTOMY Bilateral 1985   gynecomastia  . PACEMAKER INSERTION  2013   St Jude,implantable defibrillator  . stents  07/08/2008   3   . TEE WITHOUT CARDIOVERSION N/A 12/19/2015   Procedure: TRANSESOPHAGEAL ECHOCARDIOGRAM (TEE);  Surgeon: Alleen Borne, MD;  Location: [facility] OR;  Service: Open Heart Surgery: Pre-op EF ~35% inferior AK & global HK. -> EF improved post-op (EF not reported).    Current Meds  Medication Sig  . aspirin 325 MG tablet Take 325 mg by mouth daily.  . carvedilol (COREG) 12.5 MG tablet Take 1 tablet (12.5 mg total) by mouth 2 (two) times daily with a meal. (Patient taking  differently: Take 25 mg by mouth 2 (two) times daily with a meal. )  . clopidogrel (PLAVIX) 75 MG tablet Take 75 mg by mouth daily.  Marland Kitchen FLUoxetine (PROZAC) 40 MG capsule Take 40 mg by mouth daily.  . furosemide (LASIX) 40 MG tablet Take 40 mg by mouth daily.   Marland Kitchen HUMALOG KWIKPEN 200 UNIT/ML SOPN Inject 25-30 Units into the skin 3 (three) times daily before meals. Take 25 units with breakfast & lunch Take 30 units with dinner  . Insulin Degludec (TRESIBA FLEXTOUCH) 200 UNIT/ML SOPN Inject 80 Units into the skin at bedtime.   . lansoprazole (PREVACID) 15 MG capsule Take 15 mg by mouth at bedtime.   Marland Kitchen losartan (COZAAR) 25 MG tablet Take 25 mg by mouth daily.  . metFORMIN (GLUCOPHAGE) 500 MG tablet Take 1-2 tablets (500-1,000 mg total) by mouth 2 (two) times daily with a meal. Take 500 mg every morning  Take 1000 mg at bedtime  . oxymetazoline (AFRIN) 0.05 % nasal spray Place 1 spray into both nostrils 2 (two) times daily.   . potassium chloride SA (K-DUR,KLOR-CON) 20 MEQ tablet Take 1 tablet (20 mEq total) by mouth daily.  . rosuvastatin (CRESTOR) 40 MG tablet Take 40 mg by mouth daily.    Allergies  Allergen Reactions  . Sulfa Antibiotics Swelling and Rash    SWELLING REACTION UNSPECIFIED   . Penicillins Rash    Social History   Social History  . Marital status: Married    Spouse name: Maralyn Sago  . Number of children: 1  . Years of education: college   Occupational History  . 1    Social History Main Topics  . Smoking status: Former Smoker    Quit date: 07/07/2008  . Smokeless tobacco: Never Used     Comment: quit in 2010  . Alcohol use 0.0 oz/week     Comment: rarely  . Drug use: No  . Sexual activity: Not Asked   Other Topics Concern  . None   Social History Narrative  . None    family history includes Dementia in his father; Heart Problems in his father; Kidney failure in his father.  Wt Readings from Last 3 Encounters:  10/01/16 (!) 314 lb (142.4 kg)  09/15/16 (!) 309  lb 8 oz (140.4 kg)  04/21/16 (!) 301 lb (136.5 kg)    PHYSICAL EXAM BP (!) 139/92   Pulse 94   Ht 5\' 11"  (1.803 m)   Wt (!) 314 lb (142.4 kg)   BMI 43.79 kg/m  General appearance: alert, cooperative, appears stated age, no distress. Morbidly  obese, well-groomed HEENT: [place]/AT, EOMI, MMM, anicteric sclera Neck: no adenopathy, no carotid bruit and no JVD Lungs: clear to auscultation bilaterally, normal percussion bilaterally and non-labored Heart: regular rate and rhythm, S1 &S2 distant but normal, no murmur, click, rub or gallop; unable to palpate PMI due to body habitus Abdomen: soft, non-tender; bowel sounds normal; no masses; morbid obesity. Unable to palpate HSM or HJR Extremities: extremities normal, atraumatic, no cyanosis; Edema -1+ bilateral Pulses: 2+ and symmetric;  Skin: mobility and turgor normal, no evidence of bleeding or bruising and no lesions noted  Neurologic: Mental status: Alert & oriented x 3, thought content appropriate; non-focal exam.  Pleasant mood & affect. Cranial nerves: normal (II-XII grossly intact)    Adult ECG Report  Rate: 91 ;  Rhythm: normal sinus rhythm and Normal axis, intervals and durations. Anterior MI, age undetermined.;   Narrative Interpretation: No previous available for review.   Other studies Reviewed: Additional studies/ records that were reviewed today include:  Recent Labs:  Not available No results found for: CHOL, HDL, LDLCALC, LDLDIRECT, TRIG, CHOLHDL   ASSESSMENT / PLAN: Problem List Items Addressed This Visit    CAD (coronary artery disease), native coronary artery - Primary (Chronic)    Severe native CAD with occluded RCA as well as OM1 and nongraftable circumflex. Apparently his EF improved after CABG a little have any follow-up valuation. CABG 2 with LIMA to LAD and SVG ramus intermedius by Dr. Laneta SimmersBartle. No further anginal symptoms. He actually never did have anginal symptoms prior to his CABG. He was noted to have abnormal  thoracic impedance on his ICD evaluation.  Plan: Continue her medications as he is relatively a symptomatically. He is on carvedilol and losartan stable doses. He is on Crestor and just had lipids checked. He is on aspirin and Plavix as well as standing dose of Lasix.      Relevant Orders   EKG 12-Lead   Chronic combined systolic and diastolic heart failure, NYHA class 1 (HCC) (Chronic)    Relative astigmatic markers standpoint. Minimal edema which is probably not cardiac in nature without any orthopnea or PND. We'll be monitoring his thoracic impedance by ICD.  Continue current dose of ARB, beta blocker and Lasix. We will need to see what his postop EF is (looking to see if he is having done by Dr. Jacinto HalimGanji, if not we can reassess with another echocardiogram. If he still remains below EF 40%, we can start him on Entresto as opposed to ARB.      Dyslipidemia, goal LDL below 70 (Chronic)    Recently check lipids with total cholesterol 111, was rise 151, HDL 28 and LDL 53. Continue current dose of Crestor for now. Based on his disease, LDL goal is probably to be reduced down to 50 now. So he is therefore close to goal.      Essential hypertension (Chronic)    Borderline hypotensive today. He told him home with splint 110-120/62-70 mmHg. He's a little bit stressed today having lost getting here. Graciela HusbandsKlein continue to follow pressures as being  His EF we may be switching from [facility]B to [facility]  For now will maintain current management.      Ischemic cardiomyopathy (Chronic)    This is a long-standing diagnosis with an ICD placed in 2013 for EF of 35%. Percent post CABG EF, but Dr. Laneta SimmersBartle noted that his EF did improve on postop TEE. We'll try to see if Dr. Jacinto HalimGanji is checked an echocardiogram significant a better assessment.  If it is still reduced, would consider switching to [facility]. He is already on stable dose of ARB and beta blocker Only requiring standing dose of 40 million Lasix with very unusual  when necessary dosage.      Morbid obesity with body mass index (BMI) of 40.0 to 44.9 in adult [facility]) (Chronic)    He seems to be trying to exercise, but is really limited with his diet. I gave him some dietary recommendations, but he may very well benefit from nutrition counseling.      NSVT (nonsustained ventricular tachycardia) (HCC) (Chronic)    This was the reason for his ICD in place in the past. He has not had any further episodes that I can tell. We will have his ICD evaluations transferred over to [facility].      Relevant Orders   EKG 12-Lead   Presence of single chamber automatic cardioverter/defibrillator (AICD) (Chronic)    As far as I can tell, no abnormal findings since CABG. We will transfer monitoring of his AICD to our [facility] EP team will also help monitor thoracic impedance.  His wound site is well-healed.        New patient with long-standing history. Multiple studies reviewed. Very complex.   Current medicines are reviewed at length with the patient today. (+/- concerns) none The following changes have been made: none  Patient Instructions  No changes at present time   Your physician recommends that you schedule a follow-up appointment in OCT 2018 WITH DR Nevaeha Finerty.   Studies Ordered:   Orders Placed This Encounter  Procedures  . EKG 12-Lead      Bryan Lemma, M.D., M.S. Interventional Cardiologist   Pager # [phone] Phone # [phone] [address] [place], [place] [address]

## 2016-10-03 ENCOUNTER — Encounter: Payer: Self-pay | Admitting: Cardiology

## 2016-10-03 DIAGNOSIS — E1169 Type 2 diabetes mellitus with other specified complication: Secondary | ICD-10-CM | POA: Insufficient documentation

## 2016-10-03 DIAGNOSIS — Z6841 Body Mass Index (BMI) 40.0 and over, adult: Secondary | ICD-10-CM

## 2016-10-03 DIAGNOSIS — E785 Hyperlipidemia, unspecified: Secondary | ICD-10-CM

## 2016-10-03 NOTE — Assessment & Plan Note (Signed)
Borderline hypotensive today. He told him home with splint 110-120/62-70 mmHg. He's a little bit stressed today having lost getting here. Graciela HusbandsKlein continue to follow pressures as being  His EF we may be switching from ARB to [facility]  For now will maintain current management.

## 2016-10-03 NOTE — Assessment & Plan Note (Signed)
As far as I can tell, no abnormal findings since CABG. We will transfer monitoring of his AICD to our [facility] EP team will also help monitor thoracic impedance.  His wound site is well-healed.

## 2016-10-03 NOTE — Assessment & Plan Note (Signed)
Severe native CAD with occluded RCA as well as OM1 and nongraftable circumflex. Apparently his EF improved after CABG a little have any follow-up valuation. CABG 2 with LIMA to LAD and SVG ramus intermedius by Dr. Laneta SimmersBartle. No further anginal symptoms. He actually never did have anginal symptoms prior to his CABG. He was noted to have abnormal thoracic impedance on his ICD evaluation.  Plan: Continue her medications as he is relatively a symptomatically. He is on carvedilol and losartan stable doses. He is on Crestor and just had lipids checked. He is on aspirin and Plavix as well as standing dose of Lasix.

## 2016-10-03 NOTE — Assessment & Plan Note (Addendum)
Relative astigmatic markers standpoint. Minimal edema which is probably not cardiac in nature without any orthopnea or PND. We'll be monitoring his thoracic impedance by ICD.  Continue current dose of ARB, beta blocker and Lasix. We will need to see what his postop EF is (looking to see if he is having done by Dr. Jacinto HalimGanji, if not we can reassess with another echocardiogram. If he still remains below EF 40%, we can start him on Entresto as opposed to ARB.

## 2016-10-03 NOTE — Assessment & Plan Note (Signed)
He seems to be trying to exercise, but is really limited with his diet. I gave him some dietary recommendations, but he may very well benefit from nutrition counseling.

## 2016-10-03 NOTE — Assessment & Plan Note (Signed)
Recently check lipids with total cholesterol 111, was rise 151, HDL 28 and LDL 53. Continue current dose of Crestor for now. Based on his disease, LDL goal is probably to be reduced down to 50 now. So he is therefore close to goal.

## 2016-10-03 NOTE — Assessment & Plan Note (Signed)
This was the reason for his ICD in place in the past. He has not had any further episodes that I can tell. We will have his ICD evaluations [facility].

## 2016-10-03 NOTE — Assessment & Plan Note (Signed)
This is a long-standing diagnosis with an ICD placed in 2013 for EF of 35%. Percent post CABG EF, but Dr. Laneta SimmersBartle noted that his EF did improve on postop TEE. We'll try to see if Dr. Jacinto HalimGanji is checked an echocardiogram significant a better assessment. If it is still reduced, would consider switching to [facility]. He is already on stable dose of ARB and beta blocker Only requiring standing dose of 40 million Lasix with very unusual when necessary dosage.

## 2016-10-04 ENCOUNTER — Encounter: Payer: Self-pay | Admitting: Cardiology

## 2016-10-05 ENCOUNTER — Encounter: Payer: Self-pay | Admitting: Neurology

## 2016-10-05 ENCOUNTER — Ambulatory Visit (INDEPENDENT_AMBULATORY_CARE_PROVIDER_SITE_OTHER): Payer: Self-pay | Admitting: Neurology

## 2016-10-05 ENCOUNTER — Ambulatory Visit (INDEPENDENT_AMBULATORY_CARE_PROVIDER_SITE_OTHER): Payer: Managed Care, Other (non HMO) | Admitting: Neurology

## 2016-10-05 DIAGNOSIS — R202 Paresthesia of skin: Secondary | ICD-10-CM | POA: Diagnosis not present

## 2016-10-05 NOTE — Procedures (Signed)
     HISTORY:  Brett CoffinChristopher Stehlik is a 46 year old patient with a history of diabetes. The patient reports onset over the last several months of migratory shocklike sensations throughout the body and arms and legs. Both sides of the body may be involved. The patient is being evaluated for a possible neuropathy.  NERVE CONDUCTION STUDIES:  Nerve conduction studies were performed on both lower extremities. The distal motor latencies for the posterior tibial nerves bilaterally and for the right peroneal nerve were within normal limits with normal motor amplitudes for these nerves. No response was seen for the left peroneal nerve, but with the study to the tibialis anterior muscle there appears to be a normal distal motor latency and motor amplitude for the left peroneal nerve. The nerve conduction velocities for the right peroneal nerve and for the posterior tibial nerves were normal bilaterally and normal for the left peroneal nerve with take up at the tibialis anterior muscle. The sensory latencies for the peroneal and sural nerves were within normal limits bilaterally. The F wave latencies for the posterior tibial nerves were within normal limits bilaterally and the H reflex latencies were within normal limits bilaterally.  EMG STUDIES:  EMG study was performed on the right lower extremity:  The tibialis anterior muscle reveals 2 to 4K motor units with full recruitment. No fibrillations or positive waves were seen. The peroneus tertius muscle reveals 2 to 4K motor units with full recruitment. No fibrillations or positive waves were seen. The medial gastrocnemius muscle reveals 1 to 3K motor units with full recruitment. No fibrillations or positive waves were seen. The vastus lateralis muscle reveals 2 to 4K motor units with full recruitment. No fibrillations or positive waves were seen. The iliopsoas muscle reveals 2 to 4K motor units with full recruitment. No fibrillations or positive waves  were seen. The biceps femoris muscle (long head) reveals 2 to 4K motor units with full recruitment. No fibrillations or positive waves were seen. The lumbosacral paraspinal muscles were tested at 3 levels, and revealed no abnormalities of insertional activity at all 3 levels tested. There was fair relaxation.   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 EMG evaluation of the right lower extremity was unremarkable, no evidence of an overlying lumbosacral radiculopathy was seen.  Marlan Palau. Keith Ifeanyi Mickelson MD 10/05/2016 4:07 PM  [facility] [address] [place], [place] [id number]  Phone [phone] Fax [phone]

## 2016-10-05 NOTE — Progress Notes (Signed)
Please call and advise the patient that the recent EMG and nerve conduction velocity test, which is the electrical nerve and muscle test we we performed, was reported as within normal limits for the most part with the exception of mild pinched nerve type changes L leg (which typically comes from repeated pressure, like crossing your leg). We checked for abnormal electrical discharges in the muscles or nerves and the report suggested otherwise normal findings, no widespread neuropathy or nerve damage. No further action is required on this test at this time. Please remind patient to keep any upcoming appointments or tests and to call us with any interim questions, concerns, problems or updates. Should have routine sleep apnea FU next year. Thanks,  Huston FoleySaima Marius Betts, MD, PhD

## 2016-10-07 ENCOUNTER — Telehealth: Payer: Self-pay

## 2016-10-07 NOTE — Telephone Encounter (Signed)
-----   Message from Huston FoleySaima Athar, MD sent at 10/05/2016  4:33 PM EDT ----- Please call and advise the patient that the recent EMG and nerve conduction velocity test, which is the electrical nerve and muscle test we we performed, was reported as within normal limits for the most part with the exception of mild pinched nerve type changes L leg (which typically comes from repeated pressure, like crossing your leg). We checked for abnormal electrical discharges in the muscles or nerves and the report suggested otherwise normal findings, no widespread neuropathy or nerve damage. No further action is required on this test at this time. Please remind patient to keep any upcoming appointments or tests and to call us with any interim questions, concerns, problems or updates. Should have routine sleep apnea FU next year. Thanks,  Huston FoleySaima Athar, MD, PhD

## 2016-10-07 NOTE — Telephone Encounter (Signed)
I called pt to discuss NCV/EMG results. No answer, left a message asking him to call me back.

## 2016-10-08 ENCOUNTER — Ambulatory Visit (INDEPENDENT_AMBULATORY_CARE_PROVIDER_SITE_OTHER): Payer: Managed Care, Other (non HMO)

## 2016-10-08 DIAGNOSIS — R2990 Unspecified symptoms and signs involving the nervous system: Secondary | ICD-10-CM

## 2016-10-08 DIAGNOSIS — R202 Paresthesia of skin: Secondary | ICD-10-CM

## 2016-10-11 ENCOUNTER — Telehealth: Payer: Self-pay | Admitting: Cardiology

## 2016-10-11 NOTE — Telephone Encounter (Signed)
Received records requested by Dr Herbie BaltimoreHarding from [facility].  Records given to Dr Herbie BaltimoreHarding for review.  Patient also has appointment on 02/02/17. lp

## 2016-10-11 NOTE — Procedures (Signed)
    History:  Brett CoffinChristopher Drake is a 46 year old gentleman with a history of diabetes and a cardiomyopathy. The patient is having intermittent episodes of paresthesias that occur in different areas of the body. The patient is being evaluated for these events.  This is a routine EEG. No skull defects are noted. Medications include aspirin, Coreg, Plavix, Prozac, Lasix, insulin, Prevacid, Cozaar, Glucophage, multivitamins, potassium supplementation, and Crestor.   EEG classification: Normal awake and drowsy  Description of the recording: The background rhythms of this recording consists of a fairly well modulated medium amplitude alpha rhythm of 9 Hz that is reactive to eye opening and closure. As the record progresses, the patient appears to remain in the waking state throughout the recording. Photic stimulation was performed, resulting in a bilateral and symmetric photic driving response. Hyperventilation was not performed. Toward the end of the recording, the patient enters the drowsy state with slight symmetric slowing seen. The patient never enters stage II sleep. At no time during the recording does there appear to be evidence of spike or spike wave discharges or evidence of focal slowing. EKG monitor shows no evidence of cardiac rhythm abnormalities with a heart rate of 66.  Impression: This is a normal EEG recording in the waking and drowsy state. No evidence of ictal or interictal discharges are seen.

## 2016-10-11 NOTE — Telephone Encounter (Signed)
I called pt again to discuss his NCV/EMG results. No answer, left a message asking him to call me back.

## 2016-10-11 NOTE — Telephone Encounter (Signed)
I called pt again to discuss results. No answer, left a message asking him to call me back. When pt calls back, please skype me.

## 2016-10-11 NOTE — Telephone Encounter (Signed)
Patient returning your call.

## 2016-10-11 NOTE — Progress Notes (Signed)
Please call and advise the patient that the EEG or brain wave test we performed was reported as normal in the awake and drowsy states. We checked for abnormal electrical discharges in the brain waves and the report suggested normal findings. No further action is required on this test at this time. Please remind patient to keep any upcoming appointments or tests and to call us with any interim questions, concerns, problems or updates. Thanks,  Temperence Zenor, MD, PhD  

## 2016-10-11 NOTE — Telephone Encounter (Signed)
Pt returned my call. I advised him that the recent EMG and NCV test was within normal limits except mild pinched nerve type changes in the L leg, which could could be from repeated pressure such as leg crossing. No further action is required on this test at this time and I reminded pt to keep his yearly sleep apnea follow up. Pt verbalized understanding of results. Pt had no questions at this time but was encouraged to call back if questions arise.

## 2016-10-12 ENCOUNTER — Telehealth: Payer: Self-pay

## 2016-10-12 NOTE — Telephone Encounter (Signed)
-----   Message from Huston FoleySaima Athar, MD sent at 10/11/2016  6:19 PM EDT ----- Please call and advise the patient that the EEG or brain wave test we performed was reported as normal in the awake and drowsy states. We checked for abnormal electrical discharges in the brain waves and the report suggested normal findings. No further action is required on this test at this time. Please remind patient to keep any upcoming appointments or tests and to call us with any interim questions, concerns, problems or updates. Thanks,  Huston FoleySaima Athar, MD, PhD

## 2016-10-12 NOTE — Telephone Encounter (Signed)
I called pt, left a detailed message on his cell phone, per DPR, advising him that his EEG was normal and to call me back with any further questions or concerns.

## 2016-11-28 IMAGING — CR DG CHEST 1V PORT
1 series · 1 of 1 positions shown · non-contrast
Comparison: 12/21/2015

CLINICAL DATA: Status post coronary bypass grafting

EXAM:
PORTABLE CHEST 1 VIEW

[AP]
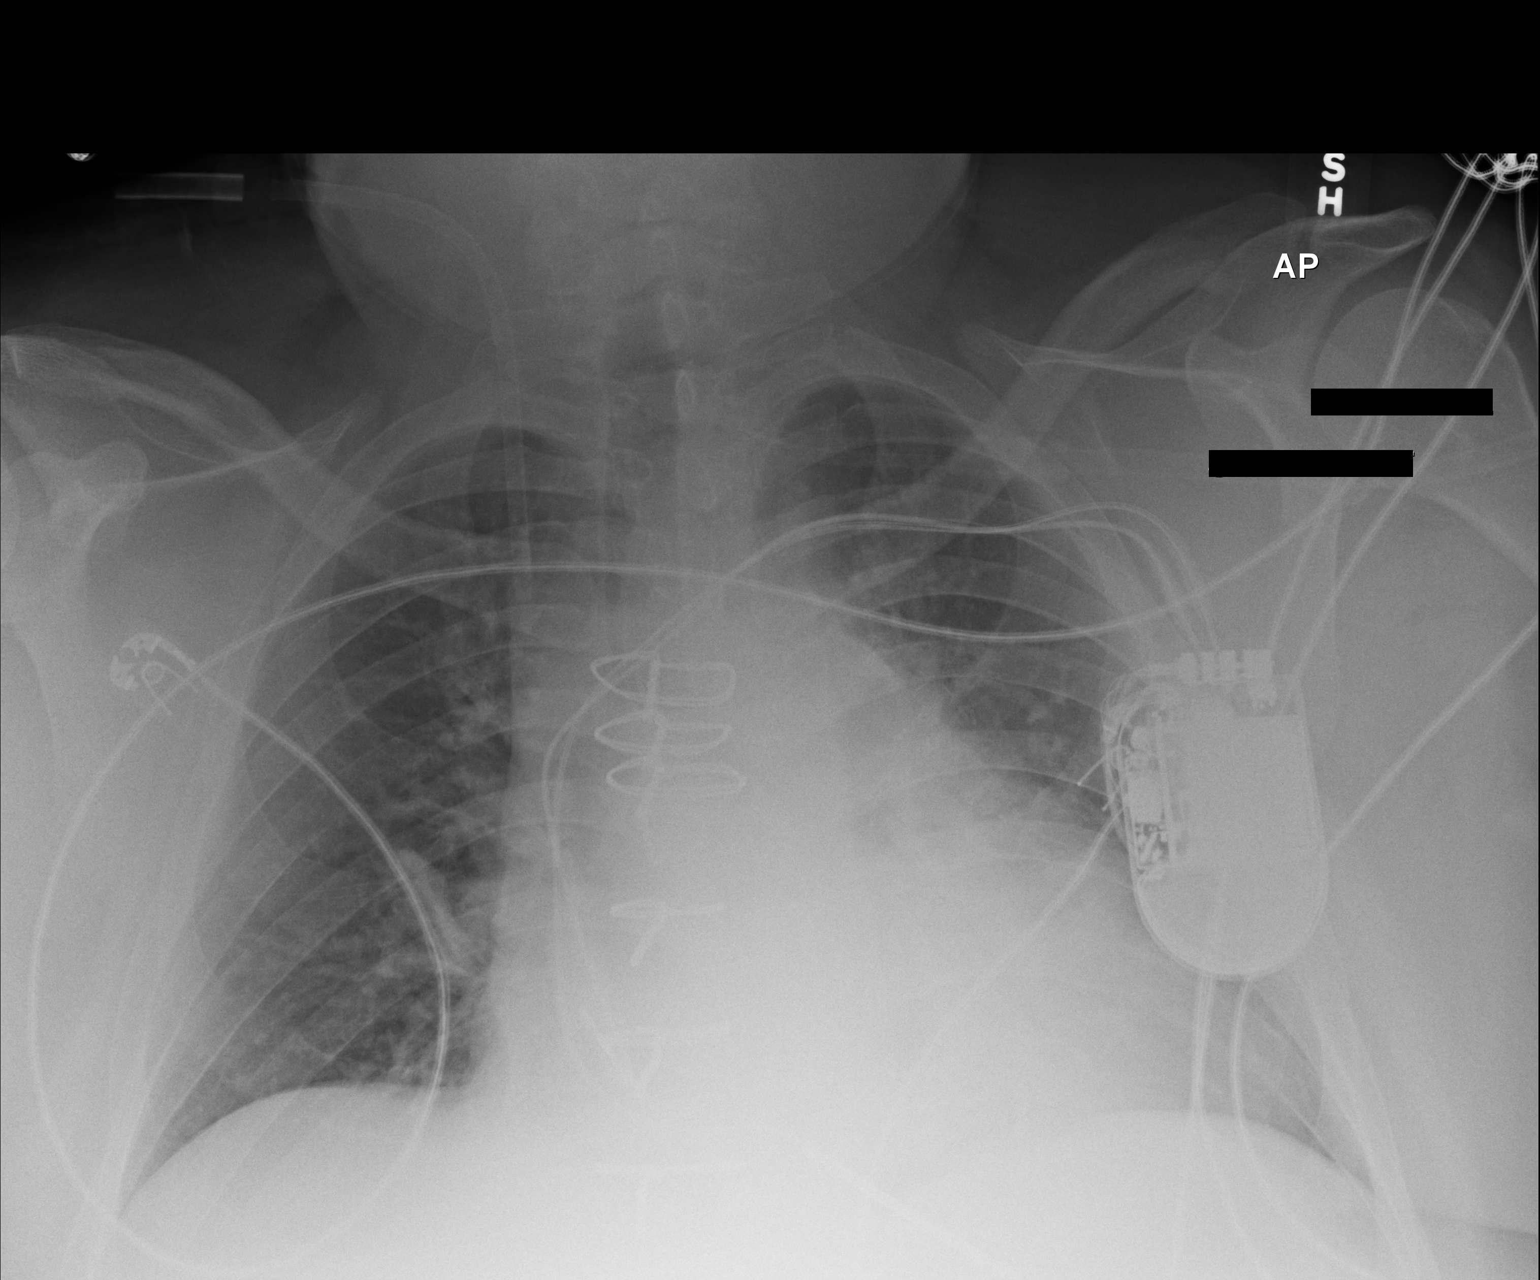

[1 of 1 positions shown; findings below may reference images not displayed]

FINDINGS: Cardiac shadow is mildly enlarged but stable. Postsurgical changes
are again seen. A pacing device and right jugular line are again
seen and stable. Lungs are well aerated bilaterally. No focal
infiltrate or sizable effusion is seen.
IMPRESSION: No acute abnormality noted.

## 2016-11-29 IMAGING — DX DG CHEST 2V
2 series · 2 of 2 positions shown · non-contrast
Comparison: 12/22/2015

CLINICAL DATA: Some sob today f/u cabg,Pleural effusion

EXAM:
CHEST  2 VIEW

[w chest pa]
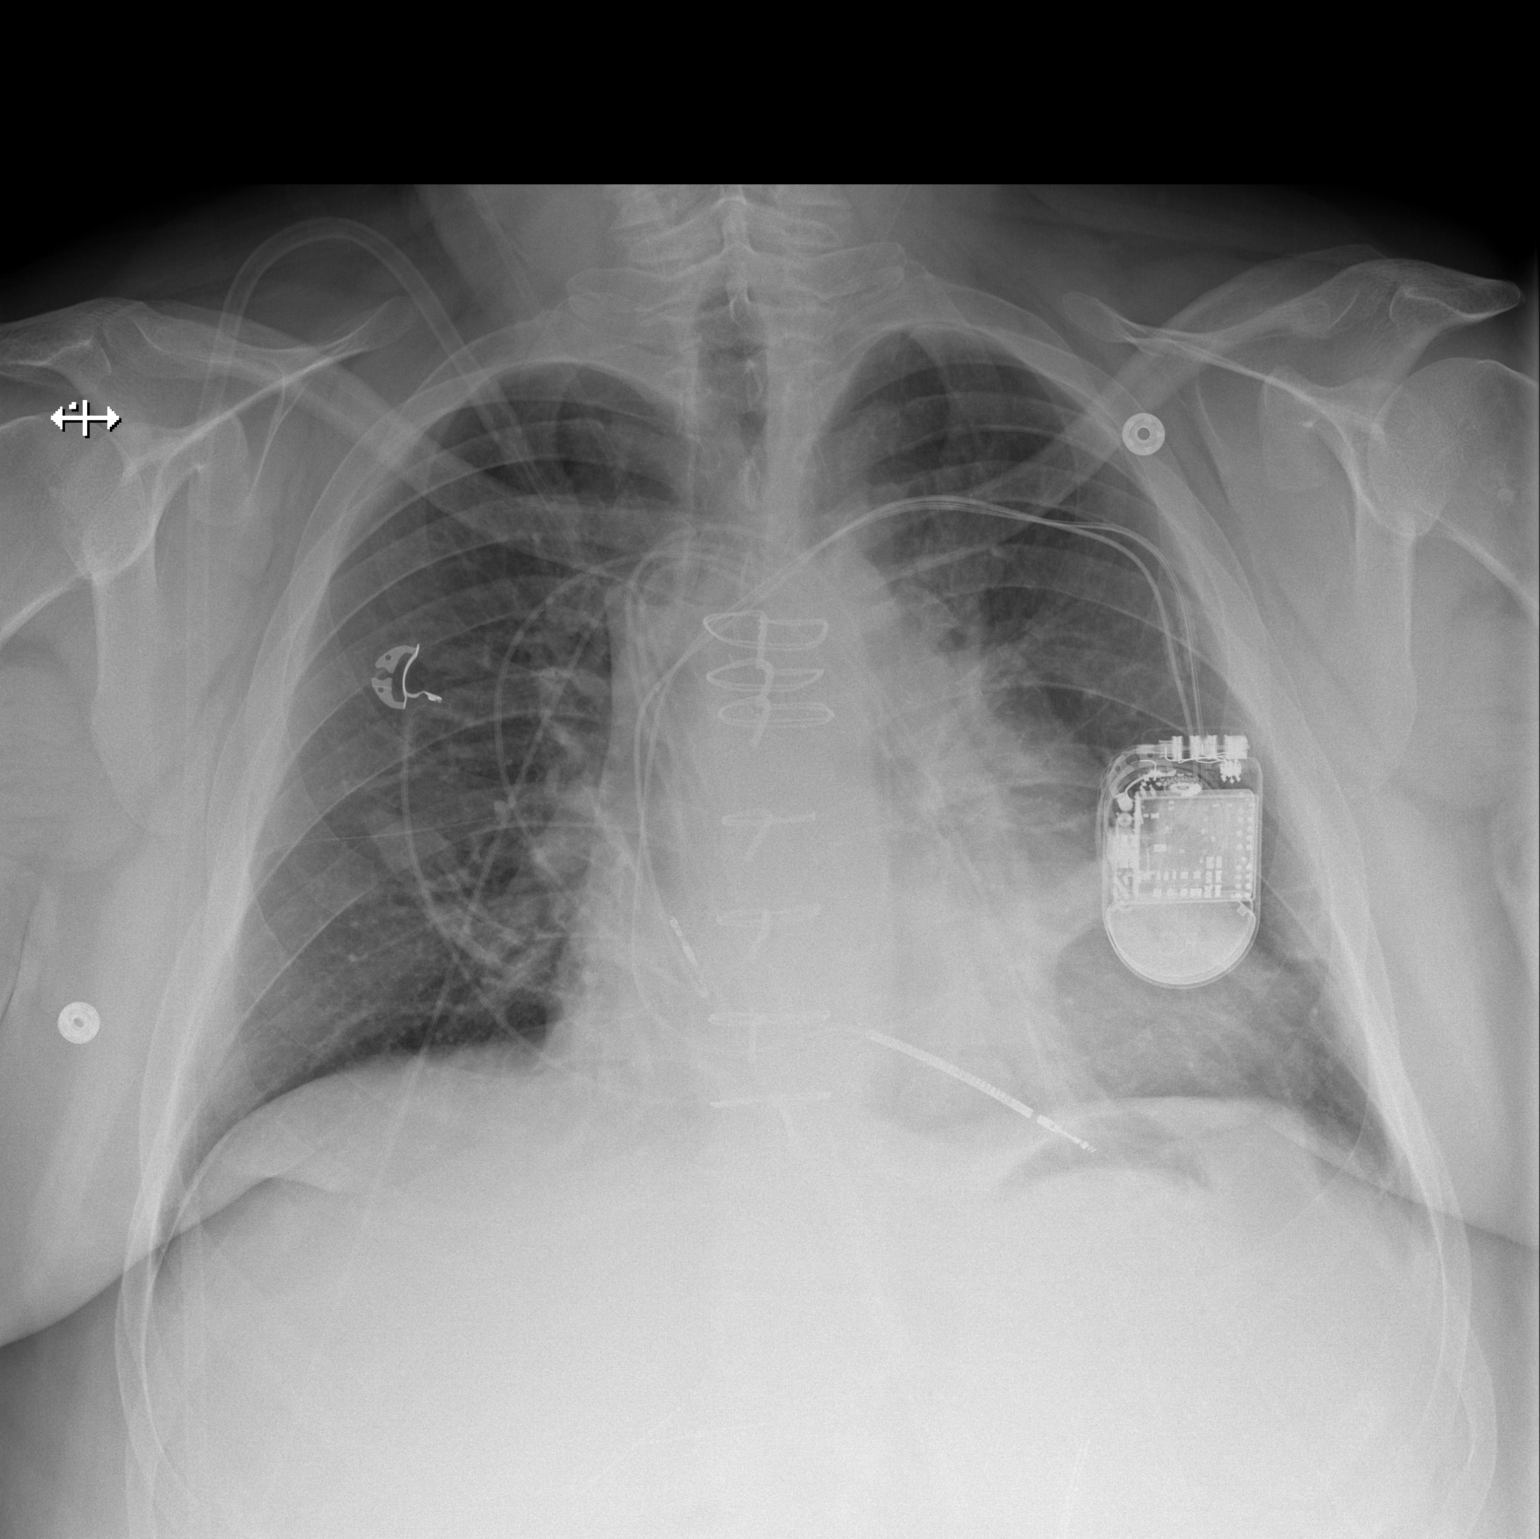

[w chest lat]
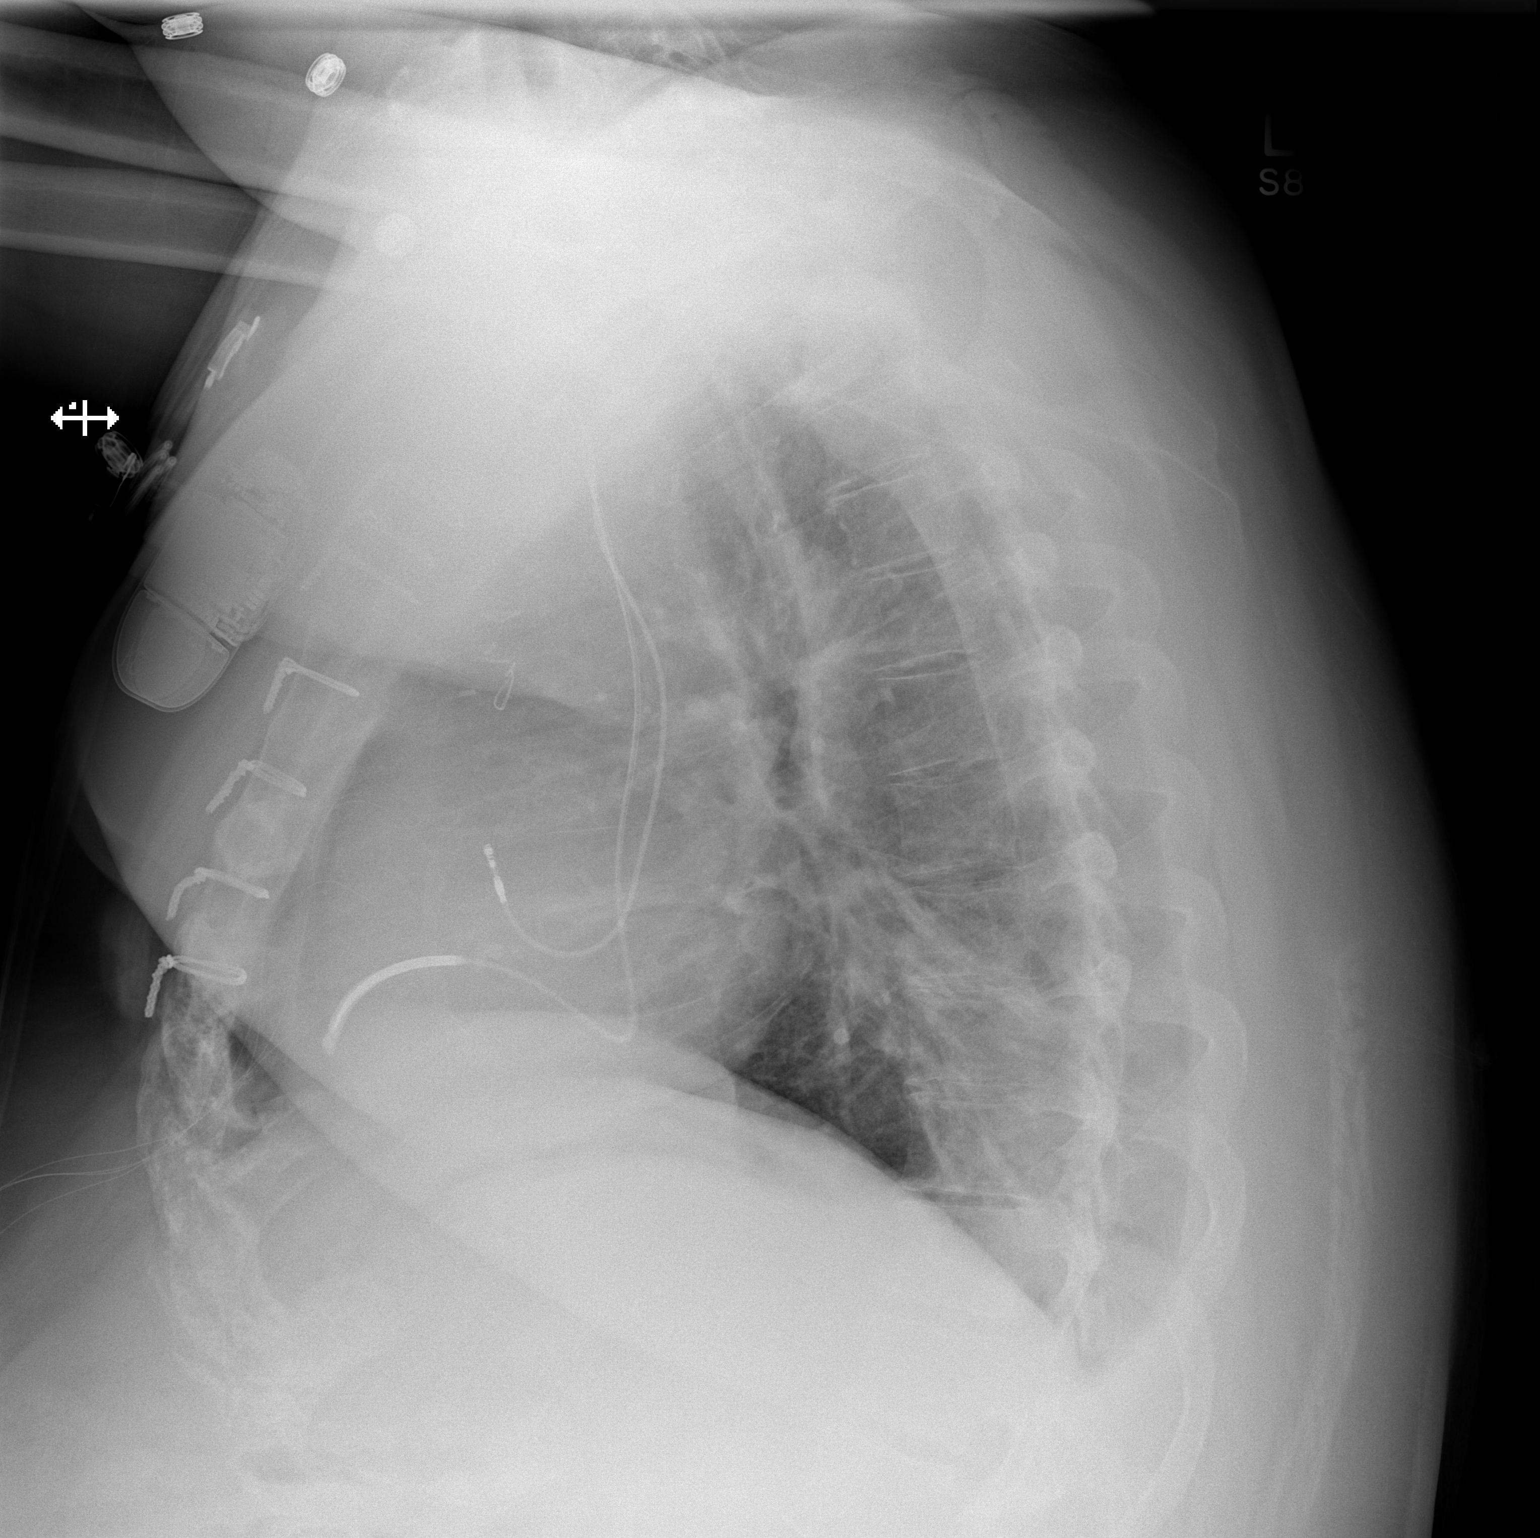

[2 of 2 positions shown; findings below may reference images not displayed]

FINDINGS: Multiple overlying wires and leads. Prior median sternotomy. Dual
lead pacer. Removal of right IJ Cordis sheath. Midline trachea.
Cardiomegaly accentuated by AP portable technique. No pleural
effusion or pneumothorax. No congestive failure. Clear lungs.
IMPRESSION: No acute cardiopulmonary disease.

Cardiomegaly without congestive failure.

## 2016-12-08 ENCOUNTER — Ambulatory Visit: Payer: Managed Care, Other (non HMO) | Admitting: Neurology

## 2016-12-28 IMAGING — CR DG CHEST 2V
2 series · 2 of 2 positions shown · non-contrast
Comparison: 12/23/2015 and earlier.

CLINICAL DATA: 45-year-old male status post CABG 4 weeks ago.
Initial encounter.

EXAM:
CHEST  2 VIEW

[w chest pa]
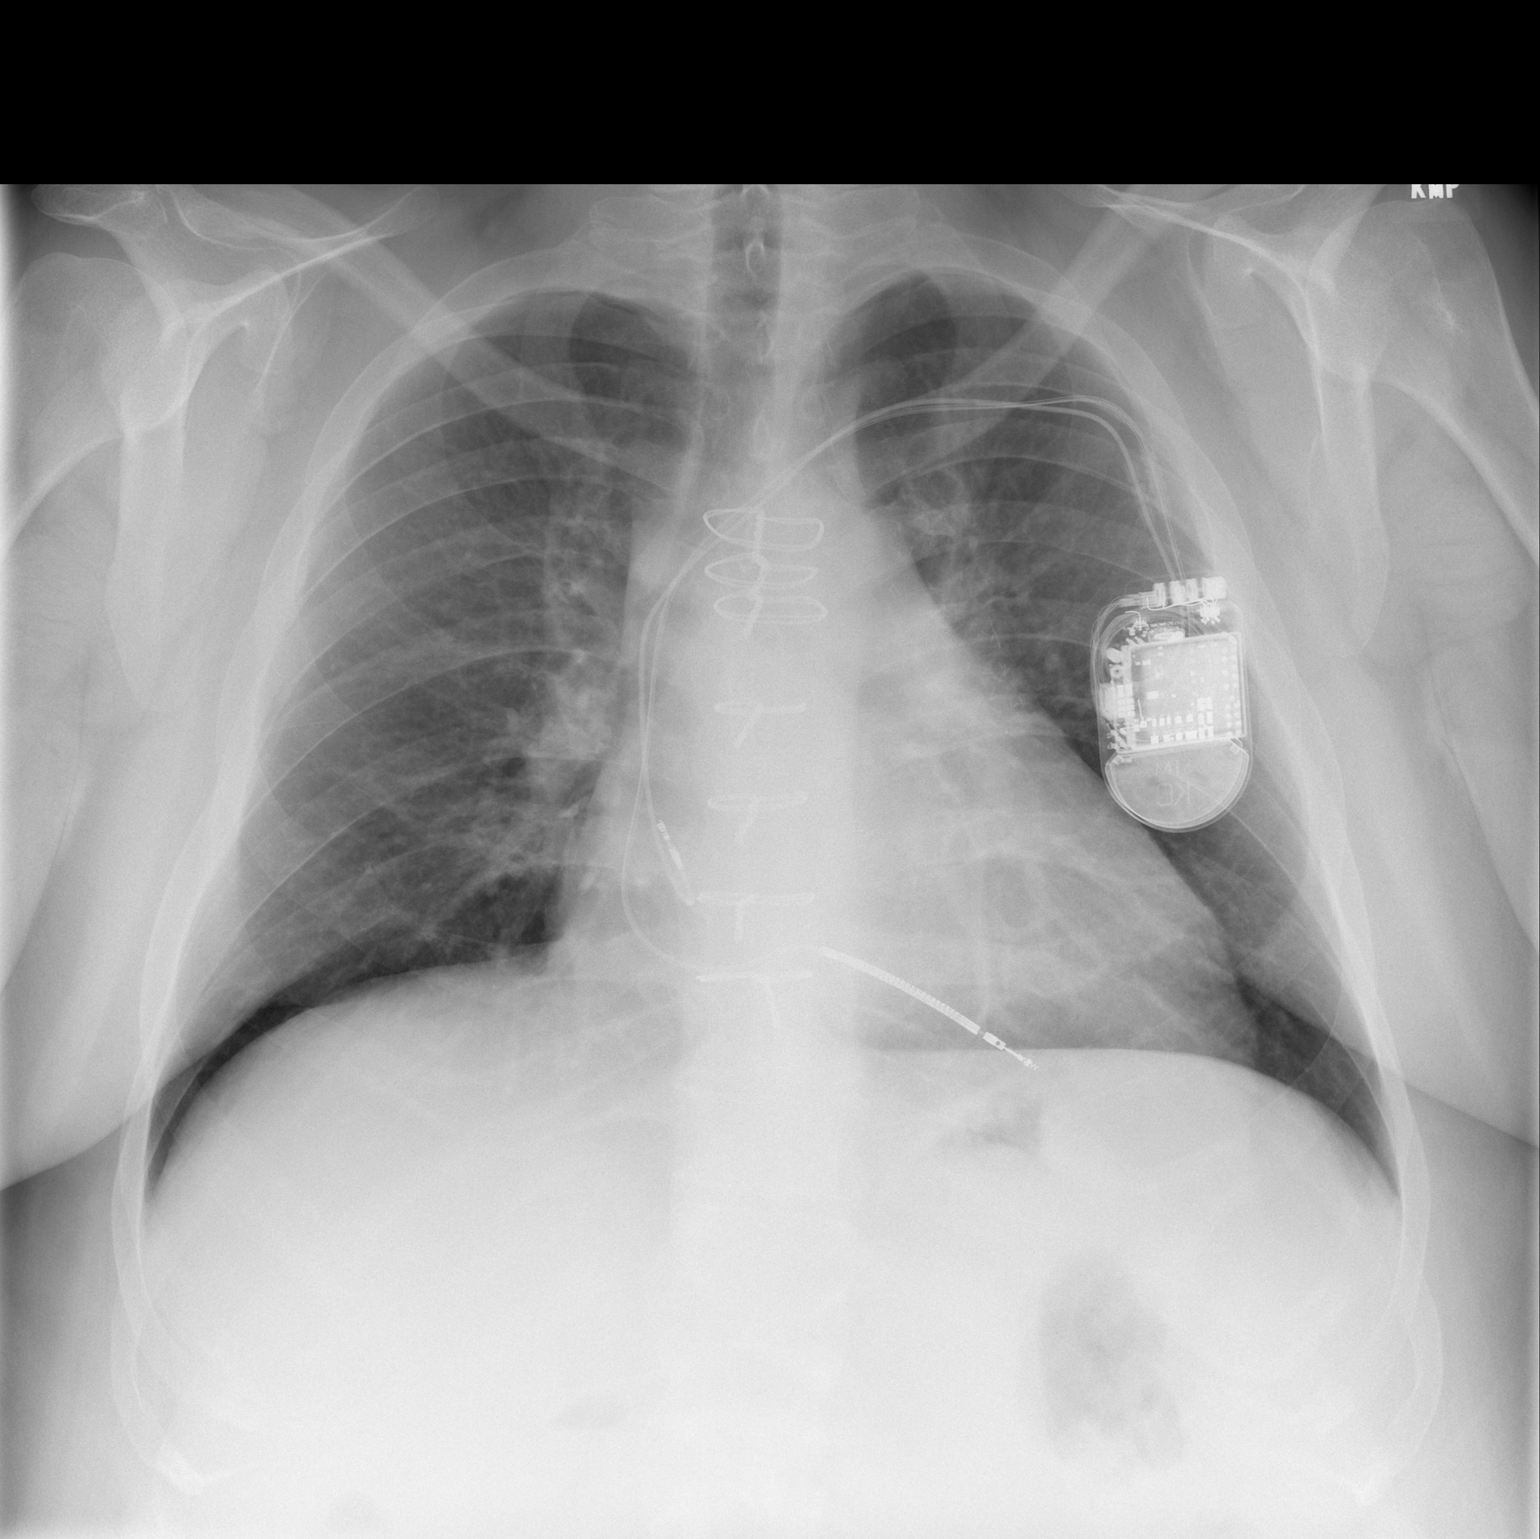

[w chest lat]
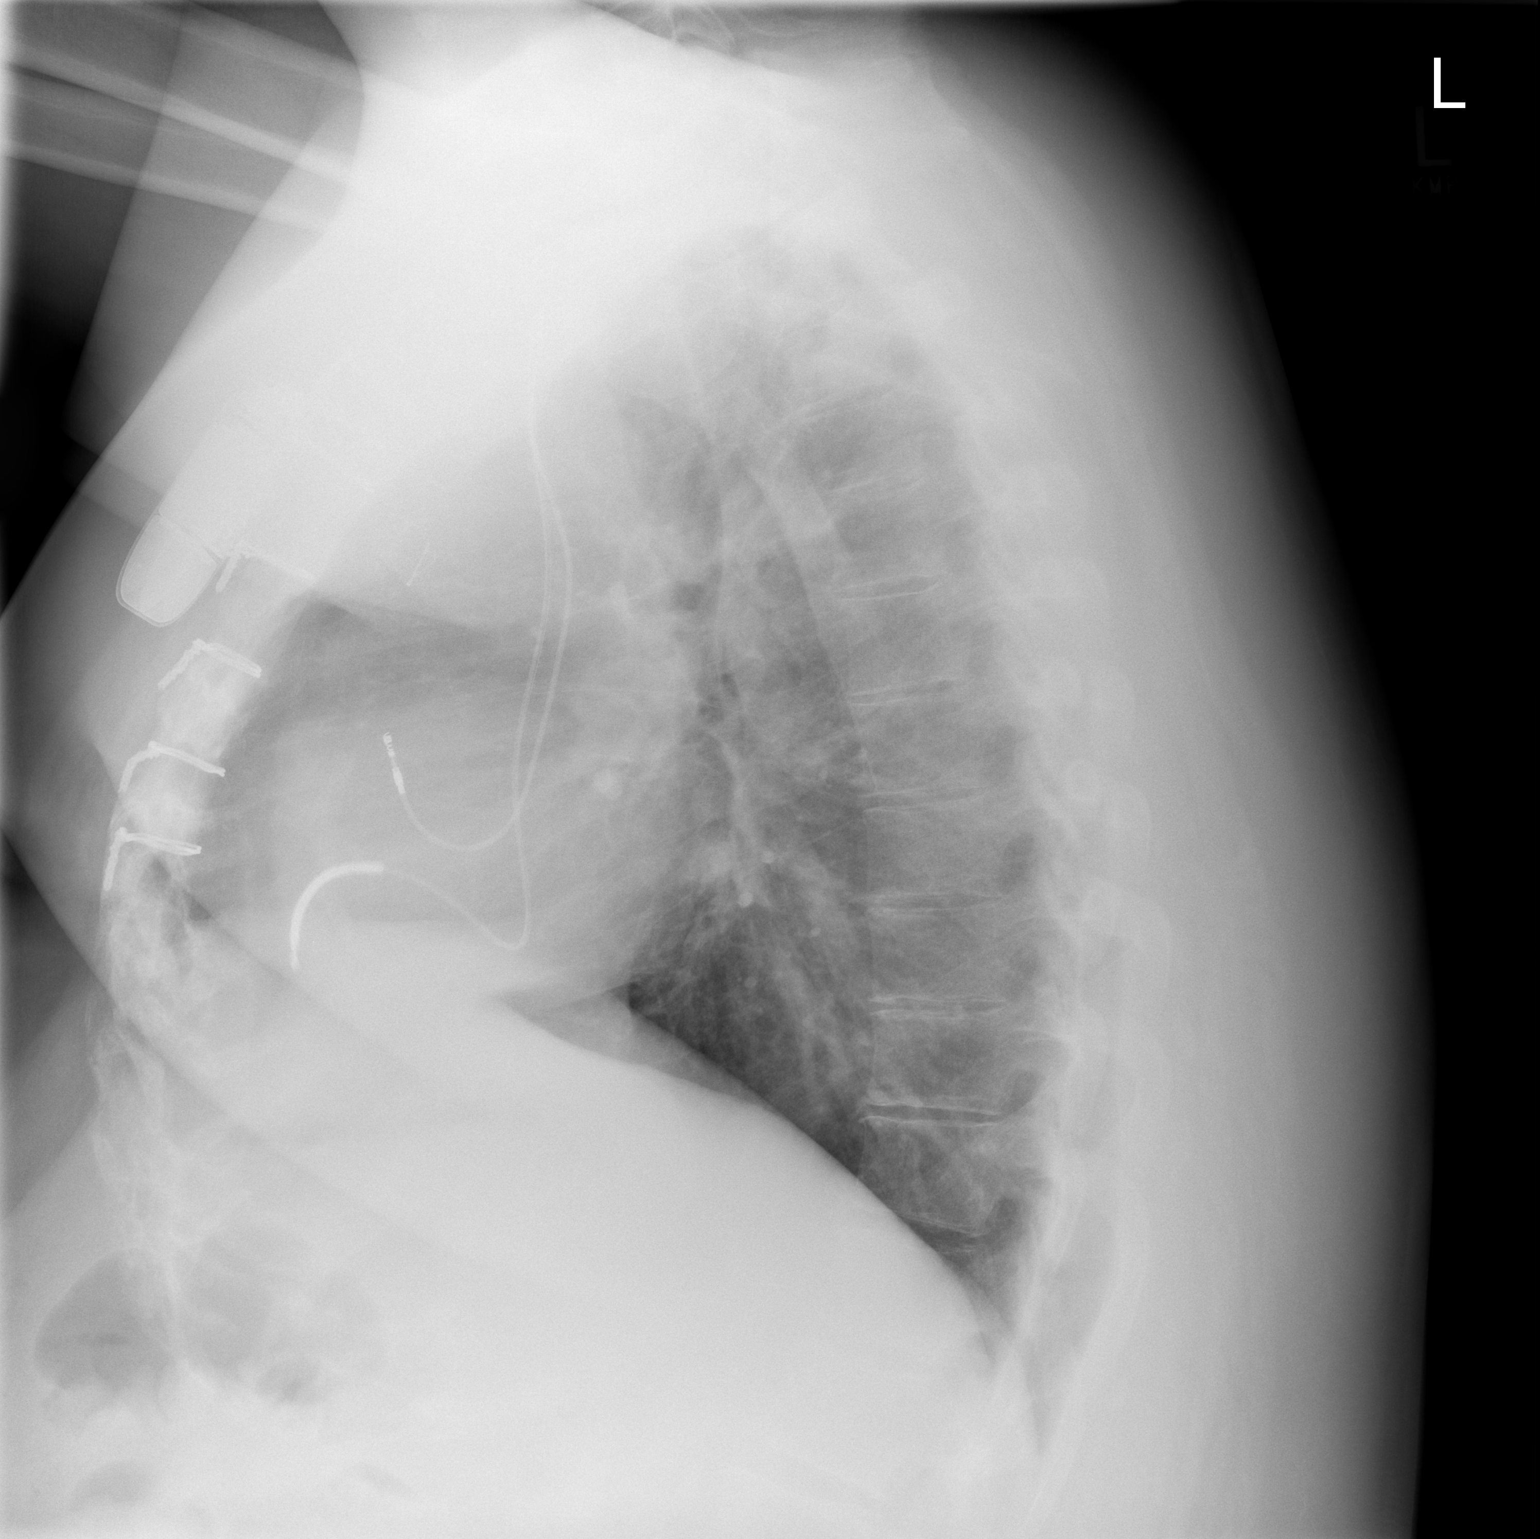

[2 of 2 positions shown; findings below may reference images not displayed]

FINDINGS: Normal lung volumes. The lungs are clear. No pneumothorax or
effusion. Sequelae of CABG. Left chest pacemaker is stable.
Mediastinal contours are within normal limits. Visualized tracheal
air column is within normal limits. No acute osseous abnormality
identified.
IMPRESSION: Negative.  No acute cardiopulmonary abnormality.

## 2017-01-09 ENCOUNTER — Encounter (HOSPITAL_COMMUNITY): Payer: Self-pay | Admitting: *Deleted

## 2017-01-09 ENCOUNTER — Ambulatory Visit (HOSPITAL_COMMUNITY)
Admission: EM | Admit: 2017-01-09 | Discharge: 2017-01-09 | Disposition: A | Discharge status: Home / Self Care | Payer: 59 | Attending: Internal Medicine | Admitting: Internal Medicine

## 2017-01-09 DIAGNOSIS — S39012A Strain of muscle, fascia and tendon of lower back, initial encounter: Secondary | ICD-10-CM | POA: Diagnosis not present

## 2017-01-09 MED ORDER — CYCLOBENZAPRINE HCL 10 MG PO TABS: 10.0000 mg | ORAL_TABLET | Freq: Every evening | ORAL | 0 refills | Status: DC | PRN

## 2017-01-09 MED ORDER — NAPROXEN 375 MG PO TABS: 375.0000 mg | ORAL_TABLET | Freq: Two times a day (BID) | ORAL | 0 refills | Status: DC

## 2017-01-09 MED ORDER — DICLOFENAC SODIUM 1 % TD GEL: 2.0000 g | Freq: Four times a day (QID) | TRANSDERMAL | 0 refills | Status: DC

## 2017-01-09 NOTE — ED Triage Notes (Addendum)
Patient reports falling in July and injuring back. States he was seen at a [facility] urgent care and given medication. States the pain went away and now it is back. Patient points to right lower side of back, pain does not radiate.   Patient states pain is a 2/10 when sitting and 6-10 out 10 when standing/moving.

## 2017-01-09 NOTE — Discharge Instructions (Signed)
Your exam was most consistent with muscle strain. Start Naproxen as directed. Voltaren gel on affected area for additional relief. Flexeril as needed at night. Flexeril can make you drowsy, so do not take if you are going to drive, operate heavy machinery, or make important decisions. Ice/heat compresses as needed. You can use a back brace during work to help with movement. This can take up to 3-4 weeks to completely resolve, but you should be feeling better each week. Follow up here or with PCP if symptoms worsen, changes for reevaluation. If experience numbness/tingling of the inner thighs, loss of bladder or bowel control, go to the emergency department for evaluation.   As we discussed, given you are on aspirin and plavix, there is an increased risk of bleeding with naproxen use. Given short course of treatment, monitor closely for any stomach upset, blood in urine/stool/vomit, chest pain, shortness of breath, go to the emergency department for further evaluation.

## 2017-01-09 NOTE — ED Provider Notes (Signed)
[facility]    CSN: [id number] Arrival date & time: 01/09/17  1234     History   Chief Complaint Chief Complaint  Patient presents with  . Back Pain    HPI Brett Drake is a 46 y.o. male.   46 year old male with history of CAD with AICD present, CHF, DM, HLD, HTN comes in for one-week exacerbation of right lower back pain. Patient states symptoms first started 3 months ago when he fell and injured the back. He was seen at [facility] urgent care and was given Vicodin and Flexeril with good relief. Patient states pain never completely resolved, but was tolerable. 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 Patient's work also requires heavy lifting. States pain is at the right lower side of the back, occasionally will radiate to the left side. Pain is worse. Patient states pain is 1-2 out of 10 when sitting, 6-10 out of 10 when walking and moving. Has been taking Tylenol 1 g 3 times a day without relief. Denies numbness, tingling, saddle anesthesia, loss of bladder or bowel control.      Past Medical History:  Diagnosis Date  . AICD (automatic cardioverter/defibrillator) present 2013   Placed for EF of 35% with inducible VT in 2013 (in [place])  . Anxiety   . Atherosclerosis   . Cardiac defibrillator in place   . Chronic combined systolic and diastolic CHF, NYHA class 2 and ACA/AHA stage C (HCC)    chronic combined systolic and diastolic  . Coronary artery disease involving native coronary artery of native heart    ? PRE 2017; 11/2015 - Cath with MV CAD: 100% pRCA, 80% pLAD, 80-90% oCx & oRI, subTO Om1. --> CABG  . Depression   . 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 GERD (gastroesophageal reflux disease)   . Headache   . History of bronchitis   . History of pneumonia 2016  . Hyperlipemia   . Hypertension    essential  .  Ischemic cardiomyopathy    EF ~35%. Previously diagnosed, but referred for CABG 11/2015.  . Morbid obesity (HCC)   . Myocardial infarction (HCC) 07/07/2008   In [place]: Acute inferior STEMI with PCI RCA was occluded)  . Sleep apnea    Epworth Score 13  . Urinary frequency    "due to medication"  . Wears glasses     Patient Active Problem List   Diagnosis Date Noted  . Paresthesia 10/05/2016  . Dyslipidemia, goal LDL below 70 10/03/2016  . Morbid obesity with body mass index (BMI) of 40.0 to 44.9 in adult (HCC) 10/03/2016  . NSVT (nonsustained ventricular tachycardia) (HCC) 11/23/2015  . CAD (coronary artery disease), native coronary artery 11/23/2015  . Chronic combined systolic and diastolic heart failure, NYHA class 1 (HCC) 10/04/2011  . Presence of single chamber automatic cardioverter/defibrillator (AICD) 10/04/2011  . Ischemic cardiomyopathy 11/03/2008  . Essential hypertension 10/04/2007    Past Surgical History:  Procedure Laterality Date  . CARDIAC CATHETERIZATION N/A 11/28/2015   Procedure: Left Heart Cath and Coronary Angiography;  Surgeon: [name], MD;  Location: [facility] INVASIVE CV LAB: EF 35% with global hypokinesis/inferior akinesis. pRCA 100% CTO (extensive L-R collaterals). LM - mild Dz. LAD: prox 70-80% (b4 D1) - FFR 0.77. m-dLAD mild diffuse CAD, Small D1 ost 90% diffuse disease. ostRI 90% (small). ostCx - 80-90% (mod diffuse D),  Major OM1 subTO prox.   Marland Kitchen CARDIAC CATHETERIZATION N/A 11/28/2015   Procedure: Intravascular Pressure Wire/FFR Study;  Surgeon: [name], MD;  Location: [facility] INVASIVE CV LAB: pLAD 70-80%, FFR 0.77  . COLONOSCOPY    . CORONARY ARTERY BYPASS GRAFT Bilateral 12/19/2015   Procedure: CORONARY ARTERY BYPASS GRAFTING TIMES 2 (CABG X 2 LIMA-LAD, SVG-RI) - using left internal mammary artery and endoscopic left saphenous vein harvest;  Surgeon: Alleen Borne, MD;  Location: [facility] OR;  Service: Open Heart Surgery;  Laterality: Bilateral;  . EYE SURGERY Left      age 33,to correct lazy eye  . FINGER SURGERY Left    middle finger  . MASTECTOMY Bilateral 1985   gynecomastia  . PACEMAKER INSERTION  2013   St Jude,implantable defibrillator  . stents  07/08/2008   3   . TEE WITHOUT CARDIOVERSION N/A 12/19/2015   Procedure: TRANSESOPHAGEAL ECHOCARDIOGRAM (TEE);  Surgeon: Alleen Borne, MD;  Location: [facility] OR;  Service: Open Heart Surgery: Pre-op EF ~35% inferior AK & global HK. -> EF improved post-op (EF not reported).       Home Medications    Prior to Admission medications   Medication Sig Start Date End Date Taking? Authorizing Provider  aspirin 325 MG tablet Take 325 mg by mouth daily.   Yes [provider]  carvedilol (COREG) 12.5 MG tablet Take 1 tablet (12.5 mg total) by mouth 2 (two) times daily with a meal. Patient taking differently: Take 25 mg by mouth 2 (two) times daily with a meal.  12/24/15  Yes Doree Fudge M, PA-C  clopidogrel (PLAVIX) 75 MG tablet Take 75 mg by mouth daily.   Yes [provider]  FLUoxetine (PROZAC) 40 MG capsule Take 40 mg by mouth daily.   Yes [provider]  furosemide (LASIX) 40 MG tablet Take 40 mg by mouth daily.  11/06/14  Yes [provider]  HUMALOG KWIKPEN 200 UNIT/ML SOPN Inject 25-30 Units into the skin 3 (three) times daily before meals. Take 25 units with breakfast & lunch Take 30 units with dinner 10/23/14  Yes [provider]  Insulin Degludec (TRESIBA FLEXTOUCH) 200 UNIT/ML SOPN Inject 80 Units into the skin at bedtime.    Yes [provider]  lansoprazole (PREVACID) 15 MG capsule Take 15 mg by mouth at bedtime.    Yes [provider]  losartan (COZAAR) 25 MG tablet Take 25 mg by mouth daily.   Yes [provider]  metFORMIN (GLUCOPHAGE) 500 MG tablet Take 1-2 tablets (500-1,000 mg total) by mouth 2 (two) times daily with a meal. Take 500 mg every morning  Take 1000 mg at bedtime 12/01/15  Yes [name], MD  oxymetazoline  (AFRIN) 0.05 % nasal spray Place 1 spray into both nostrils 2 (two) times daily.    Yes [provider]  potassium chloride SA (K-DUR,KLOR-CON) 20 MEQ tablet Take 1 tablet (20 mEq total) by mouth daily. 12/24/15  Yes Doree Fudge M, PA-C  rosuvastatin (CRESTOR) 40 MG tablet Take 40 mg by mouth daily.   Yes [provider]  cyclobenzaprine (FLEXERIL) 10 MG tablet Take 1 tablet (10 mg total) by mouth at bedtime as needed for muscle spasms. 01/09/17   Cathie Hoops, Julane Crock V, PA-C  diclofenac sodium (VOLTAREN) 1 % GEL Apply 2 g topically 4 (four) times daily. 01/09/17   Cathie Hoops, Nayah Lukens V, PA-C  naproxen (NAPROSYN) 375 MG tablet Take 1 tablet (375 mg total) by mouth 2 (two) times daily. 01/09/17  Belinda Fisher, PA-C    Family History Family History  Problem Relation Age of Onset  . Heart attack Mother 13  . Kidney failure Father   . Dementia Father   . Heart attack Father 41       Multiple MIs --> severe ICM  . Heart failure Father        transplant at age 17  . Cancer Maternal Grandmother   . Heart disease Maternal Grandfather 65       Began in 65s  . Heart disease Paternal Grandmother        Began in 63s  . Heart disease Paternal Grandfather        Begin in 15s    Social History Social History  Substance Use Topics  . Smoking status: Former Smoker    Quit date: 07/07/2008  . Smokeless tobacco: Never Used     Comment: quit in 2010  . Alcohol use 0.0 oz/week     Comment: rarely     Allergies   Sulfa antibiotics and Penicillins   Review of Systems Review of Systems  Reason unable to perform ROS: See HPI as above.     Physical Exam Triage Vital Signs ED Triage Vitals  Enc Vitals Group     BP 01/09/17 1332 130/78     Pulse Rate 01/09/17 1332 80     Resp 01/09/17 1332 17     Temp 01/09/17 1332 98 F (36.7 C)     Temp Source 01/09/17 1332 Oral     SpO2 01/09/17 1332 100 %     Weight --      Height --      Head Circumference --      Peak Flow --      Pain Score  01/09/17 1333 2     Pain Loc --      Pain Edu? --      Excl. in GC? --    No data found.   Updated Vital Signs BP 130/78 (BP Location: Left Arm)   Pulse 80   Temp 98 F (36.7 C) (Oral)   Resp 17   SpO2 100%   Physical Exam  Constitutional: He is oriented to person, place, and time. He appears well-developed and well-nourished. No distress.  HENT:  Head: Normocephalic and atraumatic.  Eyes: Pupils are equal, round, and reactive to light. Conjunctivae are normal.  Cardiovascular: Normal rate, regular rhythm and normal heart sounds.  Exam reveals no gallop and no friction rub.   No murmur heard. Pulmonary/Chest: Effort normal and breath sounds normal. He has no wheezes. He has no rales.  Musculoskeletal:  No tenderness on palpation of the spinous processes, tenderness on palpation of the lumbar region. No tenderness on palpation of the hips. Full range of motion of back and hips. Strength normal and equal bilaterally. Sensation intact and equal bilaterally.  Neurological: He is alert and oriented to person, place, and time.  Skin: Skin is warm and dry.     UC Treatments / Results  Labs (all labs ordered are listed, but only abnormal results are displayed) Labs Reviewed - No data to display  EKG  EKG Interpretation None       Radiology No results found.  Procedures Procedures (including critical care time)  Medications Ordered in UC Medications - No data to display   Initial Impression / Assessment and Plan / UC Course  I have reviewed the triage vital signs and the nursing notes.  Pertinent labs &  imaging results that were available during my care of the patient were reviewed by me and considered in my medical decision making (see chart for details).    Discussed with patient history and exam most consistent with muscle strain. Discussed with patient given history of CAD on plavix/aspirin and history of DM with renal involvement risks and benefits of taking an  NSAID. Patient expresses understanding and would like to try short course of naproxen with understanding of symptoms to watch out for such as blood in urine/stool/vomit, stomach upset. Start NSAID as directed for pain and inflammation. Voltaren gel as needed on affected area. Muscle relaxant as needed. Ice/heat compresses. Discussed with patient strain can take up to 3-4 weeks to resolve, but should be getting better each week. Follow up with PCP for further evaluation and treatment if symptoms do not resolve. Return precautions given.    Final Clinical Impressions(s) / UC Diagnoses   Final diagnoses:  Strain of lumbar region, initial encounter    New Prescriptions New Prescriptions   CYCLOBENZAPRINE (FLEXERIL) 10 MG TABLET    Take 1 tablet (10 mg total) by mouth at bedtime as needed for muscle spasms.   DICLOFENAC SODIUM (VOLTAREN) 1 % GEL    Apply 2 g topically 4 (four) times daily.   NAPROXEN (NAPROSYN) 375 MG TABLET    Take 1 tablet (375 mg total) by mouth 2 (two) times daily.      Belinda Fisher, PA-C 01/09/17 1414

## 2017-02-02 ENCOUNTER — Encounter: Payer: Self-pay | Admitting: Cardiology

## 2017-02-02 ENCOUNTER — Ambulatory Visit (INDEPENDENT_AMBULATORY_CARE_PROVIDER_SITE_OTHER): Payer: 59 | Admitting: Cardiology

## 2017-02-02 VITALS — BP 126/80 | HR 72 | Ht 71.0 in | Wt 322.8 lb

## 2017-02-02 DIAGNOSIS — E785 Hyperlipidemia, unspecified: Secondary | ICD-10-CM

## 2017-02-02 DIAGNOSIS — I5042 Chronic combined systolic (congestive) and diastolic (congestive) heart failure: Secondary | ICD-10-CM | POA: Diagnosis not present

## 2017-02-02 DIAGNOSIS — I472 Ventricular tachycardia: Secondary | ICD-10-CM | POA: Diagnosis not present

## 2017-02-02 DIAGNOSIS — I25118 Atherosclerotic heart disease of native coronary artery with other forms of angina pectoris: Secondary | ICD-10-CM | POA: Diagnosis not present

## 2017-02-02 DIAGNOSIS — I10 Essential (primary) hypertension: Secondary | ICD-10-CM

## 2017-02-02 DIAGNOSIS — I255 Ischemic cardiomyopathy: Secondary | ICD-10-CM | POA: Diagnosis not present

## 2017-02-02 DIAGNOSIS — R202 Paresthesia of skin: Secondary | ICD-10-CM

## 2017-02-02 DIAGNOSIS — Z6841 Body Mass Index (BMI) 40.0 and over, adult: Secondary | ICD-10-CM | POA: Diagnosis not present

## 2017-02-02 DIAGNOSIS — Z95810 Presence of automatic (implantable) cardiac defibrillator: Secondary | ICD-10-CM

## 2017-02-02 DIAGNOSIS — I4729 Other ventricular tachycardia: Secondary | ICD-10-CM

## 2017-02-02 NOTE — Progress Notes (Addendum)
PCP: [name], [name], NP  CT Surgeon - Dr. Laneta Simmers  Clinic Note: Chief Complaint  Patient presents with  . Follow-up  . Coronary Artery Disease  . Obesity  . Cardiomyopathy    HPI: Dameer Speiser is a 46 y.o. male with a PMH below who presents today for second follow-up visit after establishing new cardiology care (transfer from Dr. Nadara Eaton).  He was originally patient of Dr. Jeanella Cara. With history of diabetes, hypertension and hyperlipidemia as well as severe OSA on CPAP.  He had a long history of coronary disease with PCI to the occluded RCA in April 2010 in [place] when he presented with an acute MI. This is only had an ICD placed in 2013 for similar mouth daily a 35% and inducible VT.  Because of abnormal thoracic impedance with his ICD, he underwent nuclear stress test read as high risk with a very large severe defect in inferior inferolateral and inferior wall. EF was estimated 31%. Inferior akinesis. Cardiac catheterization revealing severe CAD with CTO of RCA & OM1 with severe LAD, Cx  & RI disease -> CABG.  Armen Waring was last seen About 2 weeks ago by Dr. Jacinto Halim. He is not aware of having had any post-CABG EF assessment.  A1c as of Oct 2017 had improved to 6.5 from 8.3.  Recent Hospitalizations: none  Studies Personally Reviewed - (if available, images/films reviewed: From Epic Chart or Care Everywhere)  11/2015: High risk Myoview (Dr. Jacinto Halim): Very large, severe defect in inferior-inferolateral and apical wall consistent with scar and moderate peri-infarct ischemia involving the lateral wall. Also moderate-sized anterior wall scar from base towards apex with very mild peri-infarct ischemia. EF 31%. Marked global kinesis and anterior akinesis.  Transthoracic echo 11/06/2015: EF 50-55%? ->     Cardiac CATH 11/28/2015: (Dr. Jacinto Halim) EF 35% with global hypokinesis/inferior akinesis. pRCA 100% CTO (extensive L-R collaterals). LM - mild Dz. LAD: prox 70-80% (b4 D1) -  FFR 0.77. m-dLAD mild diffuse CAD, Small D1 ost 90% diffuse disease. ostRI 90% (small). ostCx - 80-90% (relative a small caliber, mod diffuse D),  Major OM1 subTO prox.    CABG x2 12/19/2015: (Dr. Laneta Simmers - LIMA-LAD, SVG-RI); Cx & rPDA not amenable for bypass. Extensive inferior scar.  Intra-Op TEE showed EF of 35% with dilated LV, global hypokinesis and inferior akinesis. Trivial MR. Post CABG TEE views showed improved LV function.  Interval History: Aureliano presents here today for pretty much doing fine with no major complaints.  He is weight has gone back up again because his been dealing with quite a bit of back pain.  He also recently lost his father to an illness.  And partly because of his grieving he has been "guilt eating ".  He now seems to be getting over the grieving stage and is decided to rededicate himself.  He wants to change his diet and join a gym. From a cardiac standpoint however he is still relatively asymptomatic.   To be relatively stable from a CHF standpoint: He has not had any significant PND or orthopnea which is some mild end of the edema.. He denies any rapid irregular heartbeats or palpitations, syncope/near syncope or TIA or amaurosis fugax.  No claudication.  No melena, hematochezia, hematuria, or epstaxis. No claudication.  ROS: A comprehensive was performed. Review of Systems  Constitutional: Negative for malaise/fatigue and weight loss (His weights go up and down. He is a hard time losing his weight).  HENT: Negative for nosebleeds.   Respiratory:  Negative for cough and wheezing.   Cardiovascular: Negative.        He doesn't have chest pain, just a popping sensation of the sternotomy site.  Gastrointestinal: Negative for blood in stool and melena.  Genitourinary: Negative for hematuria.  Musculoskeletal: Positive for joint pain (knees & hips). Negative for myalgias.  Neurological: Positive for tingling (Bilateral legs and feet noticing "pins and needles  "sensation is there all the time. It's not necessarily in each location all time, his tends to vary from side to side and other location.). Negative for dizziness.  Psychiatric/Behavioral: Negative for depression and memory loss. The patient does not have insomnia.   All other systems reviewed and are negative.   I have reviewed and (if needed) personally updated the patient's problem list, medications, allergies, past medical and surgical history, social and family history.   Past Medical History:  Diagnosis Date  . AICD (automatic cardioverter/defibrillator) present 2013   Placed for EF of 35% with inducible VT in 2013 (in [place])  . Anxiety   . Atherosclerosis   . Cardiac defibrillator in place   . Chronic combined systolic and diastolic CHF, NYHA class 2 and ACA/AHA stage C (HCC)    chronic combined systolic and diastolic  . Coronary artery disease involving native coronary artery of native heart    ? PRE 2017; 11/2015 - Cath with MV CAD: 100% pRCA, 80% pLAD, 80-90% oCx & oRI, subTO Om1. --> CABG  . Depression   . 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 GERD (gastroesophageal reflux disease)   . Headache   . History of bronchitis   . History of pneumonia 2016  . Hyperlipemia   . Hypertension    essential  . Ischemic cardiomyopathy    EF ~35%. Previously diagnosed, but referred for CABG 11/2015.  . Morbid obesity (HCC)   . Myocardial infarction (HCC) 07/07/2008   In [place]: Acute inferior STEMI with PCI RCA was occluded)  . Sleep apnea    Epworth Score 13  . Urinary frequency    "due to medication"  . Wears glasses     Past Surgical History:  Procedure Laterality Date  . COLONOSCOPY    . EYE SURGERY Left    age 23,to correct lazy eye  . FINGER SURGERY Left    middle finger  . MASTECTOMY Bilateral 1985   gynecomastia  . PACEMAKER INSERTION  2013   [employer] Jude,implantable defibrillator  . stents  07/08/2008   3      Current Meds  Medication Sig  . aspirin 325 MG tablet Take 325 mg by mouth daily.  . carvedilol (COREG) 12.5 MG tablet Take 1 tablet (12.5 mg total) by mouth 2 (two) times daily with a meal. (Patient taking differently: Take 25 mg by mouth 2 (two) times daily with a meal. )  . clopidogrel (PLAVIX) 75 MG tablet Take 75 mg by mouth daily.  Marland Kitchen FLUoxetine HCl 60 MG TABS Take 60 mg by mouth daily.  . furosemide (LASIX) 40 MG tablet Take 40 mg by mouth daily.   Marland Kitchen HUMALOG KWIKPEN 200 UNIT/ML SOPN Inject 25-30 Units into the skin 3 (three) times daily before meals. Take 25 units with breakfast & lunch Take 30 units with dinner  . Insulin Degludec (TRESIBA FLEXTOUCH) 200 UNIT/ML SOPN Inject 80 Units into the skin at bedtime.   . lansoprazole (PREVACID) 15 MG capsule Take 15 mg by mouth at bedtime.   Marland Kitchen losartan (COZAAR)  25 MG tablet Take 25 mg by mouth daily.  . metFORMIN (GLUCOPHAGE) 500 MG tablet Take 1-2 tablets (500-1,000 mg total) by mouth 2 (two) times daily with a meal. Take 500 mg every morning  Take 1000 mg at bedtime  . oxymetazoline (AFRIN) 0.05 % nasal spray Place 1 spray into both nostrils 2 (two) times daily.   . potassium chloride SA (K-DUR,KLOR-CON) 20 MEQ tablet Take 1 tablet (20 mEq total) by mouth daily.  . rosuvastatin (CRESTOR) 40 MG tablet Take 40 mg by mouth daily.    Allergies  Allergen Reactions  . Sulfa Antibiotics Swelling and Rash    SWELLING REACTION UNSPECIFIED   . Penicillins Rash    Social History   Socioeconomic History  . Marital status: Married    Spouse name: Maralyn Sago  . Number of children: 1  . Years of education: college  . Highest education level: None  Social Needs  . Financial resource strain: None  . Food insecurity - worry: None  . Food insecurity - inability: None  . Transportation needs - medical: None  . Transportation needs - non-medical: None  Occupational History  . Occupation: 1  Tobacco Use  . Smoking status: Former Smoker    Last  attempt to quit: 07/07/2008    Years since quitting: 8.5  . Smokeless tobacco: Never Used  . Tobacco comment: quit in 2010  Substance and Sexual Activity  . Alcohol use: Yes    Alcohol/week: 0.0 oz    Comment: rarely  . Drug use: No  . Sexual activity: None  Other Topics Concern  . None  Social History Narrative  . None    family history includes Cancer in his maternal grandmother; Dementia in his father; Heart attack (age of onset: 67) in his father; Heart attack (age of onset: 47) in his mother; Heart disease in his paternal grandfather and paternal grandmother; Heart disease (age of onset: 68) in his maternal grandfather; Heart failure in his father; Kidney failure in his father.  Wt Readings from Last 3 Encounters:  02/02/17 (!) 322 lb 12.8 oz (146.4 kg)  10/01/16 (!) 314 lb (142.4 kg)  09/15/16 (!) 309 lb 8 oz (140.4 kg)    PHYSICAL EXAM BP 126/80   Pulse 72   Ht 5\' 11"  (1.803 m)   Wt (!) 322 lb 12.8 oz (146.4 kg)   BMI 45.02 kg/m   Physical Exam  Constitutional: He is oriented to person, place, and time. He appears well-developed and well-nourished. No distress.  Still morbidly obese with a BMI of 45.  Well-groomed.  HENT:  Head: Normocephalic and atraumatic.  Eyes: EOM are normal.  Neck: Normal range of motion. Neck supple. No hepatojugular reflux and no JVD (Unable to assess) present. Carotid bruit is not present.  Cardiovascular: Regular rhythm and normal pulses.  No extrasystoles are present. PMI is not displaced. Exam reveals distant heart sounds. Exam reveals no gallop.  No murmur heard. Pulmonary/Chest: Effort normal and breath sounds normal. No respiratory distress. He has no wheezes. He has no rales.  Abdominal: Soft. Bowel sounds are normal. He exhibits no distension. There is no tenderness.  Musculoskeletal: Normal range of motion. He exhibits edema (Trivial).  Neurological: He is alert and oriented to person, place, and time.  Skin: Skin is warm and dry. No  rash noted. No erythema.  Psychiatric: He has a normal mood and affect. His behavior is normal. Judgment and thought content normal.  Nursing note and vitals reviewed.  Adult ECG Report n/a  Other studies Reviewed: Additional studies/ records that were reviewed today include:  Recent Labs:  Not available -January 2018 - [facility] -(Care Everywhere)  -TC 120.  HDL 32, TG 120, LDL 64.  BUN/creatinine 21/0.92.  Potassium 5.3.  Otherwise normal chemistries and LFTs.  ASSESSMENT / PLAN: Problem List Items Addressed This Visit    CAD (coronary artery disease), native coronary artery (Chronic)    Severe native CAD in a young patient with occluded RCA and OM1 as well as a nongraftable circumflex.  He has not had post CABG evaluation status post LIMA-LAD and SVG-ramus intermedius. Interestingly, he never had any anginal symptoms, it was just simply noted to have abnormal thoracic impedance by his ICD. He will therefore need monitoring with stress test in the future. Plan: Continue aspirin plus Plavix although we could probably stop aspirin. Continue current dose of carvedilol and ARB along with atorvastatin.      Relevant Orders   ECHOCARDIOGRAM COMPLETE   Chronic combined systolic and diastolic heart failure, NYHA class 1 (HCC) - Primary (Chronic)    Despite having significantly reduced EF by echo, he remains relatively asymptomatic. Dr. Jacinto HalimGanji had anticipated checking an echocardiogram this summer, but it was never completed.  Plan:  We will therefore follow up with a 2D echo to reassess his postop EF -->  Depending on EF will consider switching [facility] los[facility].  For now continue current dose of carvedilol and losartan.  Seems relatively euvolemic on current dose of Lasix.   Discussed sliding scale Lasix      Relevant Orders   ECHOCARDIOGRAM COMPLETE   Dyslipidemia, goal LDL below 70 (Chronic)    Well-controlled lipids on current dose of Crestor.  Continue current dose of  statin.  Anticipated follow-up labs should be done in January.      Essential hypertension (Chronic)    Blood pressure looks better today.  Should be able to tolerate Entresto if EF remains low.  Otherwise continue current dose of ARB and beta-blocker.      Ischemic cardiomyopathy (Chronic)    Relatively long-standing diagnosis with ICD placed in 2013 for reduced EF.  We are reassessing EF post CABG at this point.  Continuing ARB and beta-blocker.  On stable dose of diuretic with euvolemic findings on exam. If EF remains below 40%, will consider [facility].      Relevant Orders   ECHOCARDIOGRAM COMPLETE   Morbid obesity with body mass index (BMI) of 40.0 to 44.9 in adult [facility](HCC) (Chronic)    The patient understands the need to lose weight with diet and exercise. We have discussed specific strategies for this.      NSVT (nonsustained ventricular tachycardia) (HCC) (Chronic)    No further symptomatic episodes .  We will have his ICD follow-up performed by our clinic staff. Continue current dose of beta-blocker.      Paresthesia    He realized that the paresthesia symptoms he was noticing when I last saw him were probably related to a body lotion that he was using.      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 Multiple studies reviewed. Very complex.   Current medicines are reviewed at length with the patient today. (+/- concerns) none The following changes have been made: none  Patient Instructions  SCHEDULE AT [address] Your physician has requested that you have an echocardiogram. Echocardiography is a painless  test that uses sound waves to create images of your heart. It provides your doctor with information about the size and shape of your heart and how well your heart's chambers and valves are working. This procedure takes approximately one hour. There are  no restrictions for this procedure.  WILL CONTACT OR DEVICE CLINIC TO FOLLOW YOUR DEVICE.    Your physician wants you to follow-up in MARCH 2019 You will receive a reminder letter in the mail two months in advance. If you don't receive a letter, please call our office to schedule the follow-up appointment.  If you need a refill on your cardiac medications before your next appointment, please call your pharmacy.     Studies Ordered:   Orders Placed This Encounter  Procedures  . ECHOCARDIOGRAM COMPLETE      Bryan Lemma, M.D., M.S. Interventional Cardiologist   Pager # [phone] Phone # [phone] [address] [place], [place] [address]

## 2017-02-02 NOTE — Patient Instructions (Signed)
SCHEDULE AT [address] Your physician has requested that you have an echocardiogram. Echocardiography is a painless test that uses sound waves to create images of your heart. It provides your doctor with information about the size and shape of your heart and how well your heart's chambers and valves are working. This procedure takes approximately one hour. There are no restrictions for this procedure.  WILL CONTACT OR DEVICE CLINIC TO FOLLOW YOUR DEVICE.    Your physician wants you to follow-up in MARCH 2019 You will receive a reminder letter in the mail two months in advance. If you don't receive a letter, please call our office to schedule the follow-up appointment.  If you need a refill on your cardiac medications before your next appointment, please call your pharmacy.

## 2017-02-03 HISTORY — PX: TRANSTHORACIC ECHOCARDIOGRAM: SHX275

## 2017-02-04 ENCOUNTER — Encounter: Payer: Self-pay | Admitting: Cardiology

## 2017-02-07 NOTE — Assessment & Plan Note (Signed)
No further symptomatic episodes .  We will have his ICD follow-up performed by our clinic staff. Continue current dose of beta-blocker.

## 2017-02-07 NOTE — Assessment & Plan Note (Signed)
He realized that the paresthesia symptoms he was noticing when I last saw him were probably related to a body lotion that he was using.

## 2017-02-07 NOTE — Assessment & Plan Note (Signed)
Well-controlled lipids on current dose of Crestor.  Continue current dose of statin.  Anticipated follow-up labs should be done in January.

## 2017-02-07 NOTE — Assessment & Plan Note (Signed)
Severe native CAD in a young patient with occluded RCA and OM1 as well as a nongraftable circumflex.  He has not had post CABG evaluation status post LIMA-LAD and SVG-ramus intermedius. Interestingly, he never had any anginal symptoms, it was just simply noted to have abnormal thoracic impedance by his ICD. He will therefore need monitoring with stress test in the future. Plan: Continue aspirin plus Plavix although we could probably stop aspirin. Continue current dose of carvedilol and ARB along with atorvastatin.

## 2017-02-07 NOTE — Assessment & Plan Note (Signed)
Relatively long-standing diagnosis with ICD placed in 2013 for reduced EF.  We are reassessing EF post CABG at this point.  Continuing ARB and beta-blocker.  On stable dose of diuretic with euvolemic findings on exam. If EF remains below 40%, will consider [facility].

## 2017-02-07 NOTE — Assessment & Plan Note (Signed)
The patient understands the need to lose weight with diet and exercise. We have discussed specific strategies for this.  

## 2017-02-07 NOTE — Assessment & Plan Note (Signed)
Blood pressure looks better today.  Should be able to tolerate Entresto if EF remains low.  Otherwise continue current dose of ARB and beta-blocker.

## 2017-02-07 NOTE — Assessment & Plan Note (Addendum)
Despite having significantly reduced EF by echo, he remains relatively asymptomatic. Dr. Jacinto HalimGanji had anticipated checking an echocardiogram this summer, but it was never completed.  Plan:  We will therefore follow up with a 2D echo to reassess his postop EF -->  Depending on EF will consider switching [facility].  For now continue current dose of carvedilol and losartan.  Seems relatively euvolemic on current dose of Lasix.   Discussed sliding scale Lasix

## 2017-02-10 ENCOUNTER — Ambulatory Visit (HOSPITAL_COMMUNITY): Payer: 59 | Attending: Cardiology

## 2017-02-10 ENCOUNTER — Other Ambulatory Visit: Payer: Self-pay

## 2017-02-10 ENCOUNTER — Encounter (INDEPENDENT_AMBULATORY_CARE_PROVIDER_SITE_OTHER): Payer: Self-pay

## 2017-02-10 VITALS — BP 136/85

## 2017-02-10 DIAGNOSIS — I255 Ischemic cardiomyopathy: Secondary | ICD-10-CM | POA: Diagnosis not present

## 2017-02-10 DIAGNOSIS — I25118 Atherosclerotic heart disease of native coronary artery with other forms of angina pectoris: Secondary | ICD-10-CM | POA: Insufficient documentation

## 2017-02-10 DIAGNOSIS — I5042 Chronic combined systolic (congestive) and diastolic (congestive) heart failure: Secondary | ICD-10-CM

## 2017-02-10 DIAGNOSIS — I2510 Atherosclerotic heart disease of native coronary artery without angina pectoris: Secondary | ICD-10-CM | POA: Insufficient documentation

## 2017-02-10 DIAGNOSIS — R29898 Other symptoms and signs involving the musculoskeletal system: Secondary | ICD-10-CM | POA: Insufficient documentation

## 2017-02-10 MED ORDER — PERFLUTREN LIPID MICROSPHERE
1.0000 mL | INTRAVENOUS | Status: AC | PRN
  Administered 2017-02-10: 2 mL via INTRAVENOUS

## 2017-02-14 ENCOUNTER — Encounter: Payer: Self-pay | Admitting: Internal Medicine

## 2017-02-18 ENCOUNTER — Telehealth: Payer: Self-pay | Admitting: *Deleted

## 2017-02-18 NOTE — Telephone Encounter (Signed)
LEFT MESSAGE TO CALL BACK IN REGARDS TO ECHO RESULTS 

## 2017-02-18 NOTE — Telephone Encounter (Signed)
-----   Message from Marykay Lexavid W Harding, MD sent at 02/15/2017  6:14 PM EST ----- Echocardiogram results: Left ventricle size and thickness is normal.  There is moderate to severely reduced pump function but the ejection fraction has improved somewhat up to 40% from the 30-35% prior to bypass surgery. The area that seems to be not moving well as the inferior border of the heart where the vessel was totally closed.  This probably indicates scar tissue in that area. There is still grade 2 diastolic dysfunction (abnormal relaxation with high filling pressures) --which simply means we need to continue to control blood pressure.  Right ventricle is a little bit enlarged with mildly reduced function.  Please forward to PCP: [name], Cathleen Cortieborah N, NP  Bryan Lemmaavid Harding, MD

## 2017-02-28 ENCOUNTER — Encounter: Payer: Self-pay | Admitting: Internal Medicine

## 2017-02-28 ENCOUNTER — Ambulatory Visit: Payer: 59 | Admitting: Internal Medicine

## 2017-02-28 VITALS — BP 124/80 | HR 68 | Ht 71.0 in | Wt 317.0 lb

## 2017-02-28 DIAGNOSIS — I5042 Chronic combined systolic (congestive) and diastolic (congestive) heart failure: Secondary | ICD-10-CM | POA: Diagnosis not present

## 2017-02-28 DIAGNOSIS — I472 Ventricular tachycardia: Secondary | ICD-10-CM | POA: Diagnosis not present

## 2017-02-28 DIAGNOSIS — I255 Ischemic cardiomyopathy: Secondary | ICD-10-CM | POA: Diagnosis not present

## 2017-02-28 DIAGNOSIS — I4729 Other ventricular tachycardia: Secondary | ICD-10-CM

## 2017-02-28 DIAGNOSIS — Z951 Presence of aortocoronary bypass graft: Secondary | ICD-10-CM

## 2017-02-28 DIAGNOSIS — Z95810 Presence of automatic (implantable) cardiac defibrillator: Secondary | ICD-10-CM

## 2017-02-28 NOTE — Patient Instructions (Signed)

## 2017-02-28 NOTE — Progress Notes (Signed)
HPI [name] is referred today by Dr. Dorethea Clan for ongoing evaluation and management of his ICD. He is a very pleasant 46 year old man who sustained a myocardial infarction approximately 8 years ago. He has chronic class II systolic heart failure and underwent ICD implantation at the time of his initial MI. He previously moved from [place] to [place]. The patient has not had syncope. He denies chest pain or shortness of breath. He has never had an ICD shock. He remains active, and has no PND and no orthopnea. He does use a C Pap machine for sleep apnea. Allergies  Allergen Reactions  . Sulfa Antibiotics Swelling and Rash    SWELLING REACTION UNSPECIFIED   . Penicillins Rash     Current Outpatient Medications  Medication Sig Dispense Refill  . aspirin 325 MG tablet Take 325 mg by mouth daily.    . carvedilol (COREG) 12.5 MG tablet Take 1 tablet (12.5 mg total) by mouth 2 (two) times daily with a meal. (Patient taking differently: Take 25 mg by mouth 2 (two) times daily with a meal. ) 60 tablet 1  . clopidogrel (PLAVIX) 75 MG tablet Take 75 mg by mouth daily.    Marland Kitchen FLUoxetine HCl 60 MG TABS Take 60 mg by mouth daily.    . furosemide (LASIX) 40 MG tablet Take 40 mg by mouth daily.   11  . HUMALOG KWIKPEN 200 UNIT/ML SOPN Inject 25-30 Units into the skin 3 (three) times daily before meals. Take 25 units with breakfast & lunch Take 30 units with dinner    . Insulin Degludec (TRESIBA FLEXTOUCH) 200 UNIT/ML SOPN Inject 80 Units into the skin at bedtime.     . lansoprazole (PREVACID) 15 MG capsule Take 15 mg by mouth at bedtime.     Marland Kitchen losartan (COZAAR) 25 MG tablet Take 25 mg by mouth daily.    . metFORMIN (GLUCOPHAGE) 500 MG tablet Take 1-2 tablets (500-1,000 mg total) by mouth 2 (two) times daily with a meal. Take 500 mg every morning  Take 1000 mg at bedtime    . oxymetazoline (AFRIN) 0.05 % nasal spray Place 1 spray into both nostrils 2 (two) times daily.     . potassium  chloride SA (K-DUR,KLOR-CON) 20 MEQ tablet Take 1 tablet (20 mEq total) by mouth daily. 30 tablet 1  . rosuvastatin (CRESTOR) 40 MG tablet Take 40 mg by mouth daily.     No current facility-administered medications for this visit.      Past Medical History:  Diagnosis Date  . AICD (automatic cardioverter/defibrillator) present 2013   Placed for EF of 35% with inducible VT in 2013 (in [place])  . Anxiety   . Atherosclerosis   . Cardiac defibrillator in place   . Chronic combined systolic and diastolic CHF, NYHA class 2 and ACA/AHA stage C (HCC)    chronic combined systolic and diastolic  . Coronary artery disease involving native coronary artery of native heart    ? PRE 2017; 11/2015 - Cath with MV CAD: 100% pRCA, 80% pLAD, 80-90% oCx & oRI, subTO Om1. --> CABG  . Depression   . 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 GERD (gastroesophageal reflux disease)   . Headache   . History of bronchitis   . History of pneumonia 2016  . Hyperlipemia   . Hypertension    essential  . Ischemic cardiomyopathy    EF ~35%. Previously diagnosed, but referred  for CABG 11/2015.  . Morbid obesity (HCC)   . Myocardial infarction (HCC) 07/07/2008   [place] Acute inferior STEMI with PCI RCA was occluded)  . Sleep apnea    Epworth Score 13  . Urinary frequency    "due to medication"  . Wears glasses     ROS:   All systems reviewed and negative except as noted in the HPI.   Past Surgical History:  Procedure Laterality Date  . CARDIAC CATHETERIZATION N/A 11/28/2015   Procedure: Left Heart Cath and Coronary Angiography;  Surgeon: Yates DecampJay Ganji, MD;  [facility]: EF 35% with global hypokinesis/inferior akinesis. pRCA 100% CTO (extensive L-R collaterals). LM - mild Dz. LAD: prox 70-80% (b4 D1) - FFR 0.77. m-dLAD mild diffuse CAD, Small D1 ost 90% diffuse disease. ostRI 90% (small). ostCx - 80-90% (mod diffuse D),  Major OM1  subTO prox.   Marland Kitchen. CARDIAC CATHETERIZATION N/A 11/28/2015   Procedure: Intravascular Pressure Wire/FFR Study;  Surgeon: Yates DecampJay Ganji, MD;  [facility]: pLAD 70-80%, FFR 0.77  . COLONOSCOPY    . CORONARY ARTERY BYPASS GRAFT Bilateral 12/19/2015   Procedure: CORONARY ARTERY BYPASS GRAFTING TIMES 2 (CABG X 2 LIMA-LAD, SVG-RI) - using left internal mammary artery and endoscopic left saphenous vein harvest;  Surgeon: Alleen BorneBryan K Bartle, MD;  Location: [facility] OR;  Service: Open Heart Surgery;  Laterality: Bilateral;  . EYE SURGERY Left    age 70,to correct lazy eye  . FINGER SURGERY Left    middle finger  . MASTECTOMY Bilateral 1985   gynecomastia  . PACEMAKER INSERTION  2013   St Jude,implantable defibrillator  . stents  07/08/2008   3   . TEE WITHOUT CARDIOVERSION N/A 12/19/2015   Procedure: TRANSESOPHAGEAL ECHOCARDIOGRAM (TEE);  Surgeon: Alleen BorneBryan K Bartle, MD;  [facility];  Service: Open Heart Surgery: Pre-op EF ~35% inferior AK & global HK. -> EF improved post-op (EF not reported).     Family History  Problem Relation Age of Onset  . Heart attack Mother [id number]  . Kidney failure Father   . Dementia Father   . Heart attack Father [id number]       Multiple MIs --> severe ICM  . Heart failure Father        transplant at age 46  . Cancer Maternal Grandmother   . Heart disease Maternal Grandfather [id number]       Began in 1360s  . Heart disease Paternal Grandmother        Began in 3460s  . Heart disease Paternal Grandfather        Begin in 6360s     Social History   Socioeconomic History  . Marital status: Married    Spouse name: Maralyn SagoSarah  . Number of children: 1  . Years of education: college  . Highest education level: Not on file  Social Needs  . Financial resource strain: Not on file  . Food insecurity - worry: Not on file  . Food insecurity - inability: Not on file  . Transportation needs - medical: Not on file  . Transportation needs - non-medical: Not on file  Occupational History  .  Occupation: 1  Tobacco Use  . Smoking status: Former Smoker    Last attempt to quit: 07/07/2008    Years since quitting: 8.6  . Smokeless tobacco: Never Used  . Tobacco comment: quit in 2010  Substance and Sexual Activity  . Alcohol use: Yes    Alcohol/week: 0.0 oz  Comment: rarely  . Drug use: No  . Sexual activity: Not on file  Other Topics Concern  . Not on file  Social History Narrative  . Not on file     BP 124/80   Pulse 68   Ht 5\' 11"  (1.803 m)   Wt (!) 317 lb (143.8 kg)   SpO2 98%   BMI 44.21 kg/m   Physical Exam:  Well appearing obese, 46 year old man, NAD HEENT: Unremarkable Neck:  6 cm JVD, no thyromegally Lymphatics:  No adenopathy Back:  No CVA tenderness Lungs:  Clear, with no wheezes, rales, or rhonchi HEART:  Regular rate rhythm, no murmurs, no rubs, no clicks Abd:  soft, positive bowel sounds, no organomegally, no rebound, no guarding Ext:  2 plus pulses, no edema, no cyanosis, no clubbing Skin:  No rashes no nodules Neuro:  CN II through XII intact, motor grossly intact  DEVICE  Normal device function.  See PaceArt for details.   Assess/Plan: 1. ICD - his St. Jude device is working normally. We'll recheck in several months. 2. Ischemic cardiomyopathy - he denies anginal symptoms. No change in medical therapy. 3. Chronic systolic heart failure - his symptoms are class II. He will continue his current medications. 4. Obesity - he is encouraged to lose weight.  Lewayne BuntingGregg Rajah Lamba, M.D.

## 2017-03-01 LAB — CUP PACEART INCLINIC DEVICE CHECK
Brady Statistic RA Percent Paced: 13 %
Date Time Interrogation Session: 20181126214135
HIGH POWER IMPEDANCE MEASURED VALUE: 99 Ohm
Implantable Lead Implant Date: 20130410
Implantable Pulse Generator Implant Date: 20130410
Lead Channel Impedance Value: 400 Ohm
Lead Channel Impedance Value: 550 Ohm
Lead Channel Pacing Threshold Amplitude: 1.25 V
Lead Channel Pacing Threshold Pulse Width: 0.5 ms
Lead Channel Setting Sensing Sensitivity: 0.5 mV
MDC IDC LEAD IMPLANT DT: 20130410
MDC IDC LEAD LOCATION: 753859
MDC IDC LEAD LOCATION: 753860
MDC IDC MSMT BATTERY REMAINING LONGEVITY: 46 mo
MDC IDC MSMT LEADCHNL RA PACING THRESHOLD AMPLITUDE: 1 V
MDC IDC MSMT LEADCHNL RA SENSING INTR AMPL: 1 mV
MDC IDC MSMT LEADCHNL RV PACING THRESHOLD PULSEWIDTH: 0.5 ms
MDC IDC MSMT LEADCHNL RV SENSING INTR AMPL: 12 mV
MDC IDC SET LEADCHNL RA PACING AMPLITUDE: 2 V
MDC IDC SET LEADCHNL RV PACING AMPLITUDE: 2.5 V
MDC IDC SET LEADCHNL RV PACING PULSEWIDTH: 0.5 ms
MDC IDC STAT BRADY RV PERCENT PACED: 0 %
Pulse Gen Serial Number: 1041794

## 2017-03-21 ENCOUNTER — Other Ambulatory Visit: Payer: Self-pay

## 2017-03-21 MED ORDER — CARVEDILOL 25 MG PO TABS: 25.0000 mg | ORAL_TABLET | Freq: Two times a day (BID) | ORAL | 3 refills | Status: DC

## 2017-03-21 NOTE — Telephone Encounter (Signed)
Rx(s) sent to pharmacy electronically.  

## 2017-03-31 ENCOUNTER — Telehealth: Payer: Self-pay | Admitting: *Deleted

## 2017-03-31 NOTE — Telephone Encounter (Signed)
LEFT MESSAGE TO CAL BACK IN REGARDS TO ECHO REPORT

## 2017-03-31 NOTE — Telephone Encounter (Signed)
-----   Message from David W Harding, MD sent at 02/15/2017  6:14 PM EST ----- Echocardiogram results: Left ventricle size and thickness is normal.  There is moderate to severely reduced pump function but the ejection fraction has improved somewhat up to 40% from the 30-35% prior to bypass surgery. The area that seems to be not moving well as the inferior border of the heart where the vessel was totally closed.  This probably indicates scar tissue in that area. There is still grade 2 diastolic dysfunction (abnormal relaxation with high filling pressures) --which simply means we need to continue to control blood pressure.  Right ventricle is a little bit enlarged with mildly reduced function.  Please forward to PCP: [name], Deborah N, NP  David Harding, MD  

## 2017-04-07 NOTE — Telephone Encounter (Signed)
New message ° °Pt verbalized that he is returning call for the rn  °

## 2017-04-07 NOTE — Telephone Encounter (Signed)
Spoke to patient. Result given . Verbalized understanding  

## 2017-04-17 ENCOUNTER — Ambulatory Visit (HOSPITAL_COMMUNITY)
Admission: EM | Admit: 2017-04-17 | Discharge: 2017-04-17 | Disposition: A | Discharge status: Home / Self Care | Payer: 59 | Attending: Internal Medicine | Admitting: Internal Medicine

## 2017-04-17 ENCOUNTER — Encounter (HOSPITAL_COMMUNITY): Payer: Self-pay | Admitting: *Deleted

## 2017-04-17 ENCOUNTER — Other Ambulatory Visit: Payer: Self-pay

## 2017-04-17 DIAGNOSIS — R51 Headache: Secondary | ICD-10-CM

## 2017-04-17 DIAGNOSIS — H6121 Impacted cerumen, right ear: Secondary | ICD-10-CM

## 2017-04-17 DIAGNOSIS — J111 Influenza due to unidentified influenza virus with other respiratory manifestations: Secondary | ICD-10-CM

## 2017-04-17 DIAGNOSIS — M791 Myalgia, unspecified site: Secondary | ICD-10-CM

## 2017-04-17 DIAGNOSIS — R509 Fever, unspecified: Secondary | ICD-10-CM

## 2017-04-17 DIAGNOSIS — B029 Zoster without complications: Secondary | ICD-10-CM

## 2017-04-17 DIAGNOSIS — R69 Illness, unspecified: Secondary | ICD-10-CM | POA: Diagnosis not present

## 2017-04-17 MED ORDER — METOCLOPRAMIDE HCL 5 MG/ML IJ SOLN
INTRAMUSCULAR | Status: AC
  Filled 2017-04-17: qty 2

## 2017-04-17 MED ORDER — KETOROLAC TROMETHAMINE 60 MG/2ML IM SOLN
INTRAMUSCULAR | Status: AC
  Filled 2017-04-17: qty 2

## 2017-04-17 MED ORDER — VALACYCLOVIR HCL 1 G PO TABS: 1000.0000 mg | ORAL_TABLET | Freq: Three times a day (TID) | ORAL | 0 refills | Status: AC

## 2017-04-17 MED ORDER — KETOROLAC TROMETHAMINE 60 MG/2ML IM SOLN
60.0000 mg | Freq: Once | INTRAMUSCULAR | Status: AC
  Administered 2017-04-17: 60 mg via INTRAMUSCULAR

## 2017-04-17 MED ORDER — METOCLOPRAMIDE HCL 5 MG/ML IJ SOLN
5.0000 mg | Freq: Once | INTRAMUSCULAR | Status: AC
  Administered 2017-04-17: 5 mg via INTRAMUSCULAR

## 2017-04-17 NOTE — ED Provider Notes (Signed)
[facility]    CSN: [id number] Arrival date & time: 04/17/17  1241     History   Chief Complaint Chief Complaint  Patient presents with  . URI  . Herpes Zoster    HPI Brett Drake is a 47 y.o. male histroy of CAD, DM presenting with concern over possible shingles and URI symptoms.   He broke out with a rash on his right upper back 2 days ago, states it is red with bumps. States a lot of pain and sensitivity with his shirt lying over area. Denies rashes anywhere else in body. Never had shingles in past, did have chicken pox as a child.  Patient also with URI symptoms- congestion, sore throat, headache, body aches, low grade fever. Patient's main complaints are headache and sore throat, believe related to post nasal drip. Symptoms have been going on for 3 days. Patient has tried Sudafed, Alka seltzer cold and flu, with minimal relief; also OTC anti-inflammatories. Denies nausea, vomiting, diarrhea. Denies shortness of breath and chest pain.    HPI  Past Medical History:  Diagnosis Date  . AICD (automatic cardioverter/defibrillator) present 2013   Placed for EF of 35% with inducible VT in 2013 (in [place])  . Anxiety   . Atherosclerosis   . Cardiac defibrillator in place   . Chronic combined systolic and diastolic CHF, NYHA class 2 and ACA/AHA stage C (HCC)    chronic combined systolic and diastolic  . Coronary artery disease involving native coronary artery of native heart    ? PRE 2017; 11/2015 - Cath with MV CAD: 100% pRCA, 80% pLAD, 80-90% oCx & oRI, subTO Om1. --> CABG  . Depression   . 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 GERD (gastroesophageal reflux disease)   . Headache   . History of bronchitis   . History of pneumonia 2016  . Hyperlipemia   . Hypertension    essential  . Ischemic cardiomyopathy    EF ~35%. Previously diagnosed, but referred for CABG 11/2015.  . Morbid obesity (HCC)     . Myocardial infarction (HCC) 07/07/2008   In [place]: Acute inferior STEMI with PCI RCA was occluded)  . Sleep apnea    Epworth Score 13  . Urinary frequency    "due to medication"  . Wears glasses     Patient Active Problem List   Diagnosis Date Noted  . Paresthesia 10/05/2016  . Dyslipidemia, goal LDL below 70 10/03/2016  . Morbid obesity with body mass index (BMI) of 40.0 to 44.9 in adult (HCC) 10/03/2016  . NSVT (nonsustained ventricular tachycardia) (HCC) 11/23/2015  . CAD (coronary artery disease), native coronary artery 11/23/2015  . Chronic combined systolic and diastolic heart failure, NYHA class 1 (HCC) 10/04/2011  . Presence of single chamber automatic cardioverter/defibrillator (AICD) 10/04/2011  . Ischemic cardiomyopathy 11/03/2008  . Essential hypertension 10/04/2007    Past Surgical History:  Procedure Laterality Date  . CARDIAC CATHETERIZATION N/A 11/28/2015   Procedure: Left Heart Cath and Coronary Angiography;  Surgeon: [name], MD;  Location: [facility] INVASIVE CV LAB: EF 35% with global hypokinesis/inferior akinesis. pRCA 100% CTO (extensive L-R collaterals). LM - mild Dz. LAD: prox 70-80% (b4 D1) - FFR 0.77. m-dLAD mild diffuse CAD, Small D1 ost 90% diffuse disease. ostRI 90% (small). ostCx - 80-90% (mod diffuse D),  Major OM1 subTO prox.   Marland Kitchen CARDIAC CATHETERIZATION N/A 11/28/2015   Procedure: Intravascular Pressure Wire/FFR Study;  Surgeon: [name], MD;  Location: [facility] INVASIVE CV LAB: pLAD 70-80%, FFR 0.77  . COLONOSCOPY    . CORONARY ARTERY BYPASS GRAFT Bilateral 12/19/2015   Procedure: CORONARY ARTERY BYPASS GRAFTING TIMES 2 (CABG X 2 LIMA-LAD, SVG-RI) - using left internal mammary artery and endoscopic left saphenous vein harvest;  Surgeon: Alleen BorneBryan K Bartle, MD;  Location: [facility] OR;  Service: Open Heart Surgery;  Laterality: Bilateral;  . EYE SURGERY Left    age 43,to correct lazy eye  . FINGER SURGERY Left    middle finger  . MASTECTOMY Bilateral 1985    gynecomastia  . PACEMAKER INSERTION  2013   St Jude,implantable defibrillator  . stents  07/08/2008   3   . TEE WITHOUT CARDIOVERSION N/A 12/19/2015   Procedure: TRANSESOPHAGEAL ECHOCARDIOGRAM (TEE);  Surgeon: Alleen BorneBryan K Bartle, MD;  [facility];  Service: Open Heart Surgery: Pre-op EF ~35% inferior AK & global HK. -> EF improved post-op (EF not reported).       Home Medications    Prior to Admission medications   Medication Sig Start Date End Date Taking? Authorizing Provider  aspirin 325 MG tablet Take 325 mg by mouth daily.    [provider]  carvedilol (COREG) 25 MG tablet Take 1 tablet (25 mg total) by mouth 2 (two) times daily with a meal. 03/21/17   Marykay LexHarding, David W, MD  clopidogrel (PLAVIX) 75 MG tablet Take 75 mg by mouth daily.    [provider]  FLUoxetine HCl 60 MG TABS Take 60 mg by mouth daily. 01/17/17   [provider]  furosemide (LASIX) 40 MG tablet Take 40 mg by mouth daily.  11/06/14   [provider]  HUMALOG KWIKPEN 200 UNIT/ML SOPN Inject 25-30 Units into the skin 3 (three) times daily before meals. Take 25 units with breakfast & lunch Take 30 units with dinner 10/23/14   [provider]  Insulin Degludec (TRESIBA FLEXTOUCH) 200 UNIT/ML SOPN Inject 80 Units into the skin at bedtime.     [provider]  lansoprazole (PREVACID) 15 MG capsule Take 15 mg by mouth at bedtime.     [provider]  losartan (COZAAR) 25 MG tablet Take 25 mg by mouth daily.    [provider]  metFORMIN (GLUCOPHAGE) 500 MG tablet Take 1-2 tablets (500-1,000 mg total) by mouth 2 (two) times daily with a meal. Take 500 mg every morning  Take 1000 mg at bedtime 12/01/15   Yates DecampGanji, Jay, MD  oxymetazoline (AFRIN) 0.05 % nasal spray Place 1 spray into both nostrils 2 (two) times daily.     [provider]  potassium chloride SA (K-DUR,KLOR-CON) 20 MEQ tablet Take 1 tablet (20 mEq total) by mouth daily. 12/24/15    Ardelle BallsZimmerman, Donielle M, PA-C  rosuvastatin (CRESTOR) 40 MG tablet Take 40 mg by mouth daily.    [provider]  valACYclovir (VALTREX) 1000 MG tablet Take 1 tablet (1,000 mg total) by mouth 3 (three) times daily for 7 days. 04/17/17 04/24/17  Tyneisha Hegeman, Junius CreamerHallie C, PA-C    Family History Family History  Problem Relation Age of Onset  . Heart attack Mother [id number]  . Kidney failure Father   . Dementia Father   . Heart attack Father 3742       Multiple MIs --> severe ICM  . Heart failure Father        transplant at age 47  . Cancer Maternal Grandmother   . Heart disease Maternal Grandfather [id number]       Began  in 60s  . Heart disease Paternal Grandmother        Began in 51s  . Heart disease Paternal Grandfather        Begin in 61s    Social History Social History   Tobacco Use  . Smoking status: Former Smoker    Last attempt to quit: 07/07/2008    Years since quitting: 8.7  . Smokeless tobacco: Never Used  . Tobacco comment: quit in 2010  Substance Use Topics  . Alcohol use: Yes    Alcohol/week: 0.0 oz    Comment: rarely  . Drug use: No     Allergies   Sulfa antibiotics and Penicillins   Review of Systems Review of Systems  Constitutional: Positive for fatigue and fever. Negative for activity change and appetite change.  HENT: Positive for congestion, postnasal drip and rhinorrhea. Negative for ear pain, sinus pressure and sore throat.   Eyes: Negative for pain and itching.  Respiratory: Negative for cough and shortness of breath.   Cardiovascular: Negative for chest pain.  Gastrointestinal: Negative for abdominal pain, diarrhea, nausea and vomiting.  Musculoskeletal: Positive for myalgias.  Skin: Positive for rash.  Neurological: Positive for headaches. Negative for dizziness and light-headedness.     Physical Exam Triage Vital Signs ED Triage Vitals  Enc Vitals Group     BP 04/17/17 1318 (!) 149/82     Pulse Rate 04/17/17 1318 82     Resp 04/17/17 1318 18      Temp 04/17/17 1318 98.5 F (36.9 C)     Temp Source 04/17/17 1318 Oral     SpO2 04/17/17 1318 96 %     Weight --      Height --      Head Circumference --      Peak Flow --      Pain Score 04/17/17 1325 2     Pain Loc --      Pain Edu? --      Excl. in GC? --    No data found.  Updated Vital Signs BP (!) 149/82 (BP Location: Right Arm) Comment: Notified Octavia, Pt sts that this is high for him  Pulse 82   Temp 98.5 F (36.9 C) (Oral)   Resp 18   SpO2 96%   Physical Exam  Constitutional: He appears well-developed and well-nourished. No distress.  overweight  HENT:  Head: Normocephalic and atraumatic.  Right Ear: Ear canal normal.  Left Ear: Tympanic membrane and ear canal normal.  Mouth/Throat: Uvula is midline and mucous membranes are normal. No oral lesions. No trismus in the jaw. No uvula swelling. Posterior oropharyngeal erythema present. Tonsils are 1+ on the right. No tonsillar exudate.  Right TM blocked with brown cerumen- requesting irrigation  Erythematous nasal turbinates  Eyes: Conjunctivae are normal.  Neck: Neck supple.  Cardiovascular: Normal rate and regular rhythm.  No murmur heard. Pulmonary/Chest: Effort normal and breath sounds normal. No respiratory distress. He has no wheezes. He has no rales.  Abdominal: Soft. There is no tenderness.  Musculoskeletal: He exhibits no edema.  Neurological: He is alert.  Skin: Skin is warm and dry. Rash noted. There is erythema.  2-3 cm circular area with erythematous base and 5 clustered vesicular lesions.  Psychiatric: He has a normal mood and affect.  Nursing note and vitals reviewed.    UC Treatments / Results  Labs (all labs ordered are listed, but only abnormal results are displayed) Labs Reviewed - No data to display  EKG  EKG Interpretation None       Radiology No results found.  Procedures Procedures (including critical care time)  Medications Ordered in UC Medications  ketorolac  (TORADOL) injection 60 mg (60 mg Intramuscular Given 04/17/17 1404)  metoCLOPramide (REGLAN) injection 5 mg (5 mg Intramuscular Given 04/17/17 1400)     Initial Impression / Assessment and Plan / UC Course  I have reviewed the triage vital signs and the nursing notes.  Pertinent labs & imaging results that were available during my care of the patient were reviewed by me and considered in my medical decision making (see chart for details).    Rash appearing similar to shingles, will treat with Valtrex x 7 days.  Patient presents with symptoms likely from a viral upper respiratory infection vs. influenza. Differential includes sinusitis, allergic rhinitis. Do not suspect underlying cardiopulmonary process. Symptoms seem unlikely related to ACS, CHF or COPD exacerbations, pneumonia, pneumothorax. Patient is nontoxic appearing and not in need of emergent medical intervention.  Uses daily afrin- advised of rebound congestion. Recommended symptom control with over the counter medications: Daily oral anti-histamine, Oral decongestant or IN corticosteroid, saline irrigations, cepacol lozenges, Robitussin, Delsym, honey tea.  Return if symptoms fail to improve in 1-2 weeks or you develop shortness of breath, chest pain, severe headache. Patient states understanding and is agreeable.    Final Clinical Impressions(s) / UC Diagnoses   Final diagnoses:  Influenza-like illness  Herpes zoster without complication    ED Discharge Orders        Ordered    valACYclovir (VALTREX) 1000 MG tablet  3 times daily     04/17/17 1353       Controlled Substance Prescriptions [place] Controlled Substance Registry consulted? Not Applicable   Lew Dawes, [place] 04/17/17 1438

## 2017-04-17 NOTE — ED Triage Notes (Addendum)
Per pt he is here due to shingles on upper back below his neck, per pt he is also having URI symptoms, per pt he would also like to get tested for the Flu

## 2017-04-17 NOTE — Discharge Instructions (Signed)
We gave you an injection for your headache of Toradol and Reglan.   For shingles please take valtrex three times a day for 7 days.  You likely having a viral upper respiratory infection. We recommended symptom control. I expect your symptoms to start improving in the next 1-2 weeks.   1. Take a daily allergy pill/anti-histamine like Zyrtec, Claritin, or Store brand consistently for 2 weeks  2. For congestion you may try an oral decongestant like Mucinex or sudafed. You may also try intranasal flonase nasal spray or saline irrigations (neti pot, sinus cleanse)  3. For your sore throat you may try cepacol lozenges, salt water gargles, throat spray. Treatment of congestion may also help your sore throat. Honey below  4. Take Tylenol or Ibuprofen to help with headache, pain fevers. (Up to 800 mg Ibuprofen every 8 hours with food, may supplement tylenol [phone] mg - no more than 3,000 tylenol in 24 hours).  5. Stay hydrated, drink plenty of fluids to keep throat coated and less irritated  Honey Tea For cough/sore throat try using a honey-based tea. Use 3 teaspoons of honey with juice squeezed from half lemon. Place shaved pieces of ginger into 1/2-1 cup of water and warm over stove top. Then mix the ingredients and repeat every 4 hours as needed.

## 2017-05-17 ENCOUNTER — Telehealth: Payer: Self-pay | Admitting: Cardiology

## 2017-05-17 NOTE — Telephone Encounter (Signed)
Received call back from patient, patient states he received call this AM from former cardiologist stating that they received a transmission and his fluid levels were elevated and to call them back.  Patient states he was concerned, called our device clinic to ask why we had not contacted him and they advised him to call triage.     Patient states he received a call back from former cardiologist and they informed him the transmission was from December and they just needed to verify that he was switching cardiologist, therefore is not current and he should disregard the message.    Patient apologized for confusion.  No current issues.  Advised to call with further questions or concerns.

## 2017-05-17 NOTE — Telephone Encounter (Signed)
[name] is calling because he is getting a reading from his internal defib that he is going into CHF . Please call

## 2017-05-17 NOTE — Telephone Encounter (Signed)
Left message to call back  

## 2017-05-30 ENCOUNTER — Ambulatory Visit (INDEPENDENT_AMBULATORY_CARE_PROVIDER_SITE_OTHER): Payer: 59 | Admitting: *Deleted

## 2017-05-30 DIAGNOSIS — I255 Ischemic cardiomyopathy: Secondary | ICD-10-CM

## 2017-05-30 NOTE — Progress Notes (Signed)
Remote ICD transmission.   

## 2017-06-02 ENCOUNTER — Encounter: Payer: Self-pay | Admitting: Cardiology

## 2017-06-16 LAB — CUP PACEART REMOTE DEVICE CHECK
Battery Remaining Longevity: 40 mo
Brady Statistic AP VP Percent: 1 %
Brady Statistic AP VS Percent: 2.3 %
Brady Statistic RA Percent Paced: 1.8 %
Brady Statistic RV Percent Paced: 1 %
HIGH POWER IMPEDANCE MEASURED VALUE: 99 Ohm
HighPow Impedance: 99 Ohm
Implantable Lead Implant Date: 20130410
Implantable Lead Location: 753859
Implantable Pulse Generator Implant Date: 20130410
Lead Channel Impedance Value: 560 Ohm
Lead Channel Pacing Threshold Amplitude: 1 V
Lead Channel Pacing Threshold Amplitude: 1.25 V
Lead Channel Pacing Threshold Pulse Width: 0.5 ms
Lead Channel Sensing Intrinsic Amplitude: 1 mV
Lead Channel Sensing Intrinsic Amplitude: 12 mV
Lead Channel Setting Pacing Amplitude: 2.5 V
Lead Channel Setting Pacing Pulse Width: 0.5 ms
Lead Channel Setting Sensing Sensitivity: 0.5 mV
MDC IDC LEAD IMPLANT DT: 20130410
MDC IDC LEAD LOCATION: 753860
MDC IDC MSMT BATTERY REMAINING PERCENTAGE: 41 %
MDC IDC MSMT BATTERY VOLTAGE: 2.9 V
MDC IDC MSMT LEADCHNL RA PACING THRESHOLD PULSEWIDTH: 0.5 ms
MDC IDC MSMT LEADCHNL RV IMPEDANCE VALUE: 400 Ohm
MDC IDC PG SERIAL: 1041794
MDC IDC SESS DTM: 20190225070017
MDC IDC SET LEADCHNL RA PACING AMPLITUDE: 2 V
MDC IDC STAT BRADY AS VP PERCENT: 1 %
MDC IDC STAT BRADY AS VS PERCENT: 97 %

## 2017-07-01 ENCOUNTER — Ambulatory Visit (INDEPENDENT_AMBULATORY_CARE_PROVIDER_SITE_OTHER): Payer: 59 | Admitting: Cardiology

## 2017-07-01 ENCOUNTER — Encounter: Payer: Self-pay | Admitting: Cardiology

## 2017-07-01 VITALS — BP 138/72 | HR 84 | Ht 71.0 in | Wt 310.0 lb

## 2017-07-01 DIAGNOSIS — I5042 Chronic combined systolic (congestive) and diastolic (congestive) heart failure: Secondary | ICD-10-CM | POA: Diagnosis not present

## 2017-07-01 DIAGNOSIS — E1169 Type 2 diabetes mellitus with other specified complication: Secondary | ICD-10-CM | POA: Diagnosis not present

## 2017-07-01 DIAGNOSIS — Z6841 Body Mass Index (BMI) 40.0 and over, adult: Secondary | ICD-10-CM

## 2017-07-01 DIAGNOSIS — N529 Male erectile dysfunction, unspecified: Secondary | ICD-10-CM | POA: Diagnosis not present

## 2017-07-01 DIAGNOSIS — I255 Ischemic cardiomyopathy: Secondary | ICD-10-CM | POA: Diagnosis not present

## 2017-07-01 DIAGNOSIS — I10 Essential (primary) hypertension: Secondary | ICD-10-CM

## 2017-07-01 DIAGNOSIS — I2510 Atherosclerotic heart disease of native coronary artery without angina pectoris: Secondary | ICD-10-CM | POA: Diagnosis not present

## 2017-07-01 DIAGNOSIS — E785 Hyperlipidemia, unspecified: Secondary | ICD-10-CM

## 2017-07-01 MED ORDER — SILDENAFIL CITRATE 20 MG PO TABS: ORAL_TABLET | ORAL | 11 refills | Status: DC

## 2017-07-01 MED ORDER — SACUBITRIL-VALSARTAN 24-26 MG PO TABS: 1.0000 | ORAL_TABLET | Freq: Two times a day (BID) | ORAL | 11 refills | Status: DC

## 2017-07-01 MED ORDER — CLOPIDOGREL BISULFATE 75 MG PO TABS: 75.0000 mg | ORAL_TABLET | Freq: Every day | ORAL | 11 refills | Status: DC

## 2017-07-01 NOTE — Assessment & Plan Note (Signed)
Doing well with weight loss and exercise.  12 pounds in 5 months.  Encouraged his continued efforts and congratulated his with success.  Is now exercising routinely

## 2017-07-01 NOTE — Progress Notes (Signed)
PCP: System, Provider Not In  CT Surgeon - Dr. Laneta SimmersBartle  Clinic Note: Chief Complaint  Patient presents with  . Follow-up    6 months  . Coronary Artery Disease    No angina  . Cardiomyopathy    No heart failure    HPI: Brett Drake is a 47 y.o. male with a PMH below who presents today for 2221-month followup visit: He was originally patient of Dr. Jeanella CaraJ Ganji - transferred to [facility] in summer 2018. Cardiac Hx:  With  He had a long history of coronary disease with PCI to the occluded RCA [place] when [place] presented with an acute MI. This is only had an ICD placed in 2013 for similar mouth daily a 35% and inducible VT.  Because of abnormal thoracic impedance with his ICD, he underwent nuclear stress test read as high risk with a very large severe defect in inferior inferolateral and inferior wall. EF was estimated 31%. Inferior akinesis. Cardiac catheterization revealing severe CAD with CTO of RCA & OM1 with severe LAD, Cx  & RI disease -> CABG.  Also has PMH: DM2-On Insulin & Metformin, hypertension and hyperlipidemia, obesity & severe OSA on CPAP.   Brett Drake was last seen back in late October 2018  Recent Hospitalizations: none  Studies Personally Reviewed - (if available, images/films reviewed: From Epic Chart or Care Everywhere)  Transthoracic Echo February 10, 2017: EF 40%.  For septal axis with inferior akinesis.  GR 1 DD.  Poor acoustic windows.  Mild RV dilation with slightly decreased function. -->  Slight improvement when compared to previous echo --> diastolic function has improved with ejection fraction up to 40% from 30-35%  Interval History: Brett Drake presents here today doing well with not major complaints other than issues with ED -- ? Can he use Viagra.   No resting or exertional dyspnea or anginal CP.  No PND, orthopnea (uses CPAP religiously) with only mild L>R edema usually controlled by compression stockings Back exercising -- now  that back pain is better --> lost ~12 lb in last 6 months -- diet & gym exercises (working hard).  Remains relatively asymptomatic - Class I CHF without angina  Cardiovascular Review of Symptoms: no chest pain or dyspnea on exertion positive for - edema, irregular heartbeat and erectile dysfxn negative for - orthopnea, palpitations, paroxysmal nocturnal dyspnea, rapid heart rate, shortness of breath or Syncope/near syncope, TIA/amaurosis fugax, claudication   ROS: A comprehensive was performed. Review of Systems  Constitutional: Negative for malaise/fatigue and weight loss (His weights go up and down. He is a hard time losing his weight).  HENT: Negative for nosebleeds.   Respiratory: Negative for cough and wheezing.   Cardiovascular: Negative.        He doesn't have chest pain, just a popping sensation of the sternotomy site.  Gastrointestinal: Negative for blood in stool and melena.  Genitourinary: Negative for hematuria.  Musculoskeletal: Positive for back pain and joint pain (knees & hips). Negative for myalgias.  Neurological: Positive for tingling (Bilateral legs and feet noticing "pins and needles "sensation is there all the time. It's not necessarily in each location all time, his tends to vary from side to side and other location.). Negative for dizziness.  Psychiatric/Behavioral: Negative for depression and memory loss. The patient does not have insomnia.   All other systems reviewed and are negative.  I have reviewed and (if needed) personally updated the patient's problem list, medications, allergies, past medical and surgical  history, social and family history.   Past Medical History:  Diagnosis Date  . AICD (automatic cardioverter/defibrillator) present 2013   Placed for EF of 35% with inducible VT in 2013 (in [place])  . Anxiety   . Atherosclerosis   . Chronic combined systolic and diastolic CHF, NYHA class 2 and ACA/AHA stage C (HCC) 2010  . Coronary artery disease  involving native coronary artery of native heart    ? PRE 2017; 11/2015 - Cath with MV CAD: 100% pRCA, 80% pLAD, 80-90% oCx & oRI, subTO Om1. --> CABG  . Depression   . 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 GERD (gastroesophageal reflux disease)   . Headache   . History of pneumonia 2016  . Hyperlipemia   . Hypertension    essential  . Ischemic cardiomyopathy    EF ~35%. Previously diagnosed, but referred for CABG 11/2015. --EF Nov 2018 up to ~40%.  . Morbid obesity (HCC)   . Myocardial infarction (HCC) 07/07/2008   In [place]: Acute inferior STEMI with PCI RCA was occluded)  . Sleep apnea    Epworth Score 13  . Urinary frequency    "due to medication"  . Wears glasses     Past Surgical History:  Procedure Laterality Date  . CARDIAC CATHETERIZATION N/A 11/28/2015   Procedure: Left Heart Cath and Coronary Angiography;  Surgeon: [name], MD;  Location: [facility] INVASIVE CV LAB: EF 35% with global hypokinesis/inferior akinesis. pRCA 100% CTO (extensive L-R collaterals). LM - mild Dz. LAD: prox 70-80% (b4 D1) - FFR 0.77. m-dLAD mild diffuse CAD, Small D1 ost 90% diffuse disease. ostRI 90% (small). ostCx - 80-90% (mod diffuse D),  Major OM1 subTO prox.   Marland Kitchen CARDIAC CATHETERIZATION N/A 11/28/2015   Procedure: Intravascular Pressure Wire/FFR Study;  Surgeon: [name], MD;  Location: [facility] INVASIVE CV LAB: pLAD 70-80%, FFR 0.77  . COLONOSCOPY    . CORONARY ARTERY BYPASS GRAFT Bilateral 12/19/2015   Procedure: CORONARY ARTERY BYPASS GRAFTING TIMES 2 (CABG X 2 LIMA-LAD, SVG-RI) - using left internal mammary artery and endoscopic left saphenous vein harvest;  Surgeon: Alleen Borne, MD;  Location: [facility] OR;  Service: Open Heart Surgery;  Laterality: Bilateral;  . EYE SURGERY Left    age 4,to correct lazy eye  . FINGER SURGERY Left    middle finger  . MASTECTOMY Bilateral 1985   gynecomastia  . PACEMAKER INSERTION  2013   St Jude,implantable  defibrillator  . stents  07/08/2008   3   . TEE WITHOUT CARDIOVERSION N/A 12/19/2015   Procedure: TRANSESOPHAGEAL ECHOCARDIOGRAM (TEE);  Surgeon: Alleen Borne, MD;  Location: [facility] OR;  Service: Open Heart Surgery: Pre-op EF ~35% inferior AK & global HK. -> EF improved post-op (EF not reported).  . TRANSTHORACIC ECHOCARDIOGRAM  02/2017   EF 40%.  For septal axis with inferior akinesis.  GR 1 DD.  Poor acoustic windows.  Mild RV dilation with slightly decreased function. -->  Slight improvement when compared to previous echo --> diastolic function now Gr1 (from 2) & EF up to 40% from 30-35%     Cardiac CATH 11/28/2015: (Dr. Jacinto Halim) EF 35% with global HK/ Inf AK. pRCA 100% CTO (extensive L-R collaterals). LM - mild Dz. LAD: prox 70-80% (b4 D1) - FFR 0.77. m-dLAD mild diffuse CAD, Small D1 ost 90%. ostRI 90% (small). ostCx - 80-90% (relative a small caliber, mod diffuse Dz),  Major OM1 subTO prox.    CABG x2  12/19/2015: (Dr. Laneta Simmers - LIMA-LAD, SVG-RI); Cx & rPDA not amenable for bypass. Extensive inferior scar.  Current Meds  Medication Sig  . aspirin 325 MG tablet Take 325 mg by mouth daily.  Marland Kitchen buPROPion (WELLBUTRIN) 75 MG tablet Take 75 mg by mouth daily.  . carvedilol (COREG) 25 MG tablet Take 1 tablet (25 mg total) by mouth 2 (two) times daily with a meal. (Patient taking differently: Take 37.5 mg by mouth 2 (two) times daily with a meal. )  . clopidogrel (PLAVIX) 75 MG tablet Take 1 tablet (75 mg total) by mouth daily.  Marland Kitchen FLUoxetine HCl 60 MG TABS Take 60 mg by mouth daily.  . furosemide (LASIX) 40 MG tablet Take 40 mg by mouth daily.   Marland Kitchen HUMALOG KWIKPEN 200 UNIT/ML SOPN Inject 25-30 Units into the skin 3 (three) times daily before meals. Take 25 units with breakfast & lunch Take 30 units with dinner  . Insulin Glargine (BASAGLAR KWIKPEN) 100 UNIT/ML SOPN Inject 60 Units into the skin 2 (two) times daily.  . lansoprazole (PREVACID) 15 MG capsule Take 15 mg by mouth at bedtime.   . metFORMIN  (GLUCOPHAGE) 500 MG tablet Take 1-2 tablets (500-1,000 mg total) by mouth 2 (two) times daily with a meal. Take 500 mg every morning  Take 1000 mg at bedtime  . oxymetazoline (AFRIN) 0.05 % nasal spray Place 1 spray into both nostrils 2 (two) times daily.   . potassium chloride SA (K-DUR,KLOR-CON) 20 MEQ tablet Take 1 tablet (20 mEq total) by mouth daily.  . rosuvastatin (CRESTOR) 40 MG tablet Take 40 mg by mouth daily.  . [DISCONTINUED] clopidogrel (PLAVIX) 75 MG tablet Take 75 mg by mouth daily.  . [DISCONTINUED] Insulin Degludec (TRESIBA FLEXTOUCH) 200 UNIT/ML SOPN Inject 80 Units into the skin at bedtime.   . [DISCONTINUED] losartan (COZAAR) 25 MG tablet Take 25 mg by mouth daily.    Allergies  Allergen Reactions  . Sulfa Antibiotics Swelling and Rash    SWELLING REACTION UNSPECIFIED   . Penicillins Rash    Social History   Tobacco Use  . Smoking status: Former Smoker    Last attempt to quit: 07/07/2008    Years since quitting: 8.9  . Smokeless tobacco: Never Used  . Tobacco comment: quit in 2010  Substance Use Topics  . Alcohol use: Yes    Alcohol/week: 0.0 oz    Comment: rarely  . Drug use: No   Social History   Social History Narrative  . Not on file    family history includes Cancer in his maternal grandmother; Dementia in his father; Heart attack (age of onset: 76) in his father; Heart attack (age of onset: 75) in his mother; Heart disease in his paternal grandfather and paternal grandmother; Heart disease (age of onset: 61) in his maternal grandfather; Heart failure in his father; Kidney failure in his father.  Wt Readings from Last 3 Encounters:  07/01/17 (!) 310 lb (140.6 kg)  02/28/17 (!) 317 lb (143.8 kg)  02/02/17 (!) 322 lb 12.8 oz (146.4 kg)    PHYSICAL EXAM BP 138/72 (BP Location: Left Arm, Patient Position: Sitting, Cuff Size: Large)   Pulse 84   Ht 5\' 11"  (1.803 m)   Wt (!) 310 lb (140.6 kg)   BMI 43.24 kg/m   Physical Exam  Constitutional: He is  oriented to person, place, and time. He appears well-developed and well-nourished. No distress.  Still morbidly obese with a BMI of 43.  Well-groomed.  HENT:  Head: Normocephalic and atraumatic.  Eyes:  Both eyes "lazy" R>L  Neck: Normal range of motion. No hepatojugular reflux and no JVD (Unable to assess) present. Carotid bruit is not present.  Cardiovascular: Normal rate, regular rhythm, S1 normal, S2 normal and normal pulses.  No extrasystoles are present. PMI is not displaced (difficult to palpate). Exam reveals gallop, S4 and distant heart sounds.  No murmur heard. Pulmonary/Chest: Effort normal and breath sounds normal. No respiratory distress. He has no wheezes. He has no rales.  Abdominal: Soft. Bowel sounds are normal. He exhibits no distension. There is no tenderness.  Musculoskeletal: Normal range of motion. He exhibits edema (Trivial- - with mild left> right small varicose veins and spider veins).  Neurological: He is alert and oriented to person, place, and time.  Skin: Skin is warm and dry. No rash noted. No erythema.  Psychiatric: He has a normal mood and affect. His behavior is normal. Judgment and thought content normal.  Nursing note and vitals reviewed.    Adult ECG Report  Rate: 84 ;  Rhythm: normal sinus rhythm and Poor anterior R wave progression suggests possible anterior infarct, age undetermined.  Otherwise normal axis, intervals and durations;   Narrative Interpretation: Relatively normal EKG - no change   Other studies Reviewed: Additional studies/ records that were reviewed today include:  Recent Labs:  Not available -last checked June 2018 with total cholesterol 111 8 triglycerides 151 and A1c 8.3.  HDL 28, LDL not reported.  Cr 1.08  ASSESSMENT / PLAN: Problem List Items Addressed This Visit    Morbid obesity with body mass index (BMI) of 40.0 to 44.9 in adult [facility]) (Chronic)    Doing well with weight loss and exercise.  12 pounds in 5 months.  Encouraged  his continued efforts and congratulated his with success.  Is now exercising routinely      Relevant Medications   Insulin Glargine (BASAGLAR KWIKPEN) 100 UNIT/ML SOPN   Other Relevant Orders   Lipid panel   Ischemic cardiomyopathy (Chronic)    Long-standing diagnosis dating back to 2013.  EF is probably as good as is going to be at 40%. Plan: Convert to [facility], continue current dose of beta-blocker and Lasix for now (anticipate potentially reducing dose. As he is euvolemic, we will not start spironolactone.      Relevant Medications   sacubitril-valsartan (ENTRESTO) 24-26 MG   sildenafil (REVATIO) 20 MG tablet   Other Relevant Orders   EKG 12-Lead   Hyperlipidemia associated with type 2 diabetes mellitus (HCC) target LDL<50, goal<70 (Chronic)    Remains on Crestor.  Was due to have labs checked in January.  We will go ahead and order them now.  His triglycerides are borderline on last check, with his diabetes would consider the possibility of using Jardiance for additional control.  He is being managed  by an endocrinologist (Dr. Ocie Cornfield) -- will defer, but consider cardiovascular benefits of Jardiance.) -       Relevant Medications   Insulin Glargine (BASAGLAR KWIKPEN) 100 UNIT/ML SOPN   sacubitril-valsartan (ENTRESTO) 24-26 MG   sildenafil (REVATIO) 20 MG tablet   Essential hypertension (Chronic)    Stable at upper limit of normal.  Which should be a tolerate conversion to Entresto.  Continue carvedilol.      Relevant Medications   sacubitril-valsartan (ENTRESTO) 24-26 MG   sildenafil (REVATIO) 20 MG tablet   Erectile dysfunction of organic origin (Chronic)    With relatively stable cardiac symptoms, I think he  is fine to try Viagra which we will prescribed sildenafil 20 mg tablets he can use 1-2 as needed      Coronary artery disease involving native coronary artery of native heart without angina pectoris (Chronic)    Severe native CAD with long-standing RCA and OM1  occlusion that were not graftable.  Now status post CABG with LIMA LAD and SVG-RI.  Never really had anginal symptoms.  Was just having progression of his heart failure diagnosed by a thoracic impedance change. Remains on stable dose of carvedilol and statin as well as aspirin and Plavix. We will probably need to consider stress test evaluation Next year some time.      Relevant Medications   sacubitril-valsartan (ENTRESTO) 24-26 MG   sildenafil (REVATIO) 20 MG tablet   Other Relevant Orders   EKG 12-Lead   Chronic combined systolic and diastolic heart failure, NYHA class 1 (HCC) - Primary (Chronic)    EF by echo now roughly 40%.  This is some improvement with improved diastolic function.  Plan:   Convert from losartan to low-dose Entresto.  Continue current dose of Lasix, but may anticipate reduced dose.  Discussed sliding scale.  Continue high-dose beta-blocker  Continue to follow thoracic impedance with ICD.      Relevant Medications   sacubitril-valsartan (ENTRESTO) 24-26 MG   sildenafil (REVATIO) 20 MG tablet   Other Relevant Orders   EKG 12-Lead   Lipid panel   Comprehensive metabolic panel     Current medicines are reviewed at length with the patient today. (+/- concerns) none The following changes have been made: none  Patient Instructions  MEDICATION INSTRUCTION   STOP LOSARTAN  START ENTRESTO 24/26  ONE TABLET TWICE  A DAY   SILDENAFIL 20 MG - MAY TAKE 1 TO 2 TABLET AS NEEDED.  LABS LIPID CMP DO NOT EAT OR DRINK THE MORNING OF THE TEST.   Your physician wants you to follow-up in 6  MONTHS WITH DR HARDING.You will receive a reminder letter in the mail two months in advance. If you don't receive a letter, please call our office to schedule the follow-up appointment.  If you need a refill on your cardiac medications before your next appointment, please call your pharmacy.   Studies Ordered:   Orders Placed This Encounter  Procedures  . Lipid panel    . Comprehensive metabolic panel  . EKG 12-Lead      Bryan Lemma, M.D., M.S. Interventional Cardiologist   Pager # [phone] Phone # [phone] [address] [place], [place] [address]

## 2017-07-01 NOTE — Assessment & Plan Note (Signed)
Stable at upper limit of normal.  Which should be a tolerate conversion to Entresto.  Continue carvedilol.

## 2017-07-01 NOTE — Assessment & Plan Note (Addendum)
Remains on Crestor.  Was due to have labs checked in January.  We will go ahead and order them now.  His triglycerides are borderline on last check, with his diabetes would consider the possibility of using Jardiance for additional control.  He is being managed  by an endocrinologist (Dr. Ocie CornfieldMonica Doerr) -- will defer, but consider cardiovascular benefits of Jardiance.) -

## 2017-07-01 NOTE — Assessment & Plan Note (Signed)
Long-standing diagnosis dating back to 2013.  EF is probably as good as is going to be at 40%. Plan: Convert to [facility]Entresto, continue current dose of beta-blocker and Lasix for now (anticipate potentially reducing dose. As he is euvolemic, we will not start spironolactone.

## 2017-07-01 NOTE — Assessment & Plan Note (Signed)
With relatively stable cardiac symptoms, I think he is fine to try Viagra which we will prescribed sildenafil 20 mg tablets he can use 1-2 as needed

## 2017-07-01 NOTE — Assessment & Plan Note (Signed)
Severe native CAD with long-standing RCA and OM1 occlusion that were not graftable.  Now status post CABG with LIMA LAD and SVG-RI.  Never really had anginal symptoms.  Was just having progression of his heart failure diagnosed by a thoracic impedance change. Remains on stable dose of carvedilol and statin as well as aspirin and Plavix. We will probably need to consider stress test evaluation Next year some time.

## 2017-07-01 NOTE — Assessment & Plan Note (Signed)
EF by echo now roughly 40%.  This is some improvement with improved diastolic function.  Plan:   Convert from losartan to low-dose Entresto.  Continue current dose of Lasix, but may anticipate reduced dose.  Discussed sliding scale.  Continue high-dose beta-blocker  Continue to follow thoracic impedance with ICD.

## 2017-07-01 NOTE — Patient Instructions (Addendum)
MEDICATION INSTRUCTION   STOP LOSARTAN  START ENTRESTO 24/26  ONE TABLET TWICE  A DAY   SILDENAFIL 20 MG - MAY TAKE 1 TO 2 TABLET AS NEEDED.  LABS LIPID CMP DO NOT EAT OR DRINK THE MORNING OF THE TEST.   Your physician wants you to follow-up in 6  MONTHS WITH DR HARDING.You will receive a reminder letter in the mail two months in advance. If you don't receive a letter, please call our office to schedule the follow-up appointment.  If you need a refill on your cardiac medications before your next appointment, please call your pharmacy.

## 2017-07-06 ENCOUNTER — Telehealth: Payer: Self-pay | Admitting: *Deleted

## 2017-07-06 NOTE — Telephone Encounter (Signed)
Contacted pharmacy- medication ENTRESTO 24/26 was APPROVED by insurance  Via [employer].   ( KEY [id number] LAST NAME  Ralphs DOB 1971-04-02 )

## 2017-07-06 NOTE — Telephone Encounter (Signed)
Patient aware medication approved 

## 2017-07-18 ENCOUNTER — Ambulatory Visit (HOSPITAL_COMMUNITY)
Admission: EM | Admit: 2017-07-18 | Discharge: 2017-07-18 | Disposition: A | Discharge status: Home / Self Care | Payer: 59 | Attending: Internal Medicine | Admitting: Internal Medicine

## 2017-07-18 ENCOUNTER — Ambulatory Visit (INDEPENDENT_AMBULATORY_CARE_PROVIDER_SITE_OTHER): Payer: 59

## 2017-07-18 ENCOUNTER — Encounter (HOSPITAL_COMMUNITY): Payer: Self-pay | Admitting: Emergency Medicine

## 2017-07-18 DIAGNOSIS — S93602A Unspecified sprain of left foot, initial encounter: Secondary | ICD-10-CM | POA: Diagnosis not present

## 2017-07-18 MED ORDER — HYDROCODONE-ACETAMINOPHEN 5-325 MG PO TABS: 1.0000 | ORAL_TABLET | Freq: Four times a day (QID) | ORAL | 0 refills | Status: DC | PRN

## 2017-07-18 NOTE — ED Provider Notes (Signed)
[facility]    CSN: [id number] Arrival date & time: 07/18/17  1130     History   Chief Complaint Chief Complaint  Patient presents with  . Foot Pain    HPI Brett Drake is a 47 y.o. male. Presents today with continued pain in lateral foot after twisting it 2 weeks ago stepping off a curb.  Not swollen/bruised.  Able to walk, just painful.  Pain increases as the day goes on.  No other injury reported.    HPI  Past Medical History:  Diagnosis Date  . AICD (automatic cardioverter/defibrillator) present 2013   Placed for EF of 35% with inducible VT in 2013 (in [place])  . Anxiety   . Atherosclerosis   . Chronic combined systolic and diastolic CHF, NYHA class 2 and ACA/AHA stage C (HCC) 2010  . Coronary artery disease involving native coronary artery of native heart    ? PRE 2017; 11/2015 - Cath with MV CAD: 100% pRCA, 80% pLAD, 80-90% oCx & oRI, subTO Om1. --> CABG  . Depression   . 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 GERD (gastroesophageal reflux disease)   . Headache   . History of pneumonia 2016  . Hyperlipemia   . Hypertension    essential  . Ischemic cardiomyopathy    EF ~35%. Previously diagnosed, but referred for CABG 11/2015. --EF Nov 2018 up to ~40%.  . Morbid obesity (HCC)   . Myocardial infarction (HCC) 07/07/2008   In [place]: Acute inferior STEMI with PCI RCA was occluded)  . Sleep apnea    Epworth Score 13  . Urinary frequency    "due to medication"  . Wears glasses     Patient Active Problem List   Diagnosis Date Noted  . 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 Paresthesia 10/05/2016  . Hyperlipidemia associated with type 2 diabetes mellitus (HCC) target LDL<50, goal<70 10/03/2016  . Morbid obesity with body mass index (BMI) of 40.0 to 44.9 in adult (HCC) 10/03/2016  . NSVT (nonsustained ventricular tachycardia) (HCC) 11/23/2015  . Coronary artery disease  involving native coronary artery of native heart without angina pectoris 11/23/2015  . Chronic combined systolic and diastolic heart failure, NYHA class 1 (HCC) 10/04/2011  . Presence of single chamber automatic cardioverter/defibrillator (AICD) 10/04/2011  . Ischemic cardiomyopathy 11/03/2008  . Essential hypertension 10/04/2007    Past Surgical History:  Procedure Laterality Date  . CARDIAC CATHETERIZATION N/A 11/28/2015   Procedure: Left Heart Cath and Coronary Angiography;  Surgeon: [name], MD;  Location: [facility] INVASIVE CV LAB: EF 35% with global hypokinesis/inferior akinesis. pRCA 100% CTO (extensive L-R collaterals). LM - mild Dz. LAD: prox 70-80% (b4 D1) - FFR 0.77. m-dLAD mild diffuse CAD, Small D1 ost 90% diffuse disease. ostRI 90% (small). ostCx - 80-90% (mod diffuse D),  Major OM1 subTO prox.   Marland Kitchen CARDIAC CATHETERIZATION N/A 11/28/2015   Procedure: Intravascular Pressure Wire/FFR Study;  Surgeon: [name], MD;  Location: [facility] INVASIVE CV LAB: pLAD 70-80%, FFR 0.77  . COLONOSCOPY    . CORONARY ARTERY BYPASS GRAFT Bilateral 12/19/2015   Procedure: CORONARY ARTERY BYPASS GRAFTING TIMES 2 (CABG X 2 LIMA-LAD, SVG-RI) - using left internal mammary artery and endoscopic left saphenous vein harvest;  Surgeon: Alleen Borne, MD;  Location: [facility] OR;  Service: Open Heart Surgery;  Laterality: Bilateral;  . EYE SURGERY Left    age 61,to correct lazy eye  . FINGER SURGERY Left  middle finger  . MASTECTOMY Bilateral 1985   gynecomastia  . PACEMAKER INSERTION  2013   St Jude,implantable defibrillator  . stents  07/08/2008   3   . TEE WITHOUT CARDIOVERSION N/A 12/19/2015   Procedure: TRANSESOPHAGEAL ECHOCARDIOGRAM (TEE);  Surgeon: Alleen Borne, MD;  Location: [facility] OR;  Service: Open Heart Surgery: Pre-op EF ~35% inferior AK & global HK. -> EF improved post-op (EF not reported).  . TRANSTHORACIC ECHOCARDIOGRAM  02/2017   EF 40%.  For septal axis with inferior akinesis.  GR 1 DD.  Poor acoustic  windows.  Mild RV dilation with slightly decreased function. -->  Slight improvement when compared to previous echo --> diastolic function now Gr1 (from 2) & EF up to 40% from 30-35%       Home Medications    Prior to Admission medications   Medication Sig Start Date End Date Taking? Authorizing Provider  aspirin 325 MG tablet Take 325 mg by mouth daily.    [provider]  buPROPion (WELLBUTRIN) 75 MG tablet Take 75 mg by mouth daily.    [provider]  carvedilol (COREG) 25 MG tablet Take 1 tablet (25 mg total) by mouth 2 (two) times daily with a meal. Patient taking differently: Take 37.5 mg by mouth 2 (two) times daily with a meal.  03/21/17   Marykay Lex, MD  clopidogrel (PLAVIX) 75 MG tablet Take 1 tablet (75 mg total) by mouth daily. 07/01/17   Marykay Lex, MD  FLUoxetine HCl 60 MG TABS Take 60 mg by mouth daily. 01/17/17   [provider]  furosemide (LASIX) 40 MG tablet Take 40 mg by mouth daily.  11/06/14   [provider]  HUMALOG KWIKPEN 200 UNIT/ML SOPN Inject 25-30 Units into the skin 3 (three) times daily before meals. Take 25 units with breakfast & lunch Take 30 units with dinner 10/23/14   [provider]  HYDROcodone-acetaminophen (NORCO/VICODIN) 5-325 MG tablet Take 1 tablet by mouth 4 (four) times daily as needed. 07/18/17   Isa Rankin, MD  Insulin Glargine [facility]) 100 UNIT/ML SOPN Inject 60 Units into the skin 2 (two) times daily. 06/13/17   [provider]  lansoprazole (PREVACID) 15 MG capsule Take 15 mg by mouth at bedtime.     [provider]  metFORMIN (GLUCOPHAGE) 500 MG tablet Take 1-2 tablets (500-1,000 mg total) by mouth 2 (two) times daily with a meal. Take 500 mg every morning  Take 1000 mg at bedtime 12/01/15   [name], MD  oxymetazoline (AFRIN) 0.05 % nasal spray Place 1 spray into both nostrils 2 (two) times daily.     [provider]  potassium chloride SA  (K-DUR,KLOR-CON) 20 MEQ tablet Take 1 tablet (20 mEq total) by mouth daily. 12/24/15   Ardelle Balls, PA-C  rosuvastatin (CRESTOR) 40 MG tablet Take 40 mg by mouth daily.    [provider]  sacubitril-valsartan (ENTRESTO) 24-26 MG Take 1 tablet by mouth 2 (two) times daily. 07/01/17   Marykay Lex, MD  sildenafil (REVATIO) 20 MG tablet May take  1 to 3 tablets as needed 07/01/17   Marykay Lex, MD    Family History Family History  Problem Relation Age of Onset  . Heart attack Mother 60  . Kidney failure Father   . Dementia Father   . Heart attack Father 65       Multiple MIs --> severe ICM  . Heart failure Father  transplant at age 61  . Cancer Maternal Grandmother   . Heart disease Maternal Grandfather 50       Began in 66s  . Heart disease Paternal Grandmother        Began in 23s  . Heart disease Paternal Grandfather        Begin in 40s    Social History Social History   Tobacco Use  . Smoking status: Former Smoker    Last attempt to quit: 07/07/2008    Years since quitting: 9.0  . Smokeless tobacco: Never Used  . Tobacco comment: quit in 2010  Substance Use Topics  . Alcohol use: Yes    Alcohol/week: 0.0 oz    Comment: rarely  . Drug use: No     Allergies   Sulfa antibiotics and Penicillins   Review of Systems Review of Systems  All other systems reviewed and are negative.    Physical Exam Triage Vital Signs ED Triage Vitals [07/18/17 1223]  Enc Vitals Group     BP 135/81     Pulse Rate 73     Resp 18     Temp 97.9 F (36.6 C)     Temp Source Oral     SpO2 99 %     Weight      Height      Pain Score      Pain Loc    Updated Vital Signs BP 135/81 (BP Location: Right Arm)   Pulse 73   Temp 97.9 F (36.6 C) (Oral)   Resp 18   SpO2 99%   Physical Exam  Constitutional: He is oriented to person, place, and time. No distress.  Alert, nicely groomed  HENT:  Head: Atraumatic.  Eyes:  Conjugate gaze, no eye  redness/drainage  Neck: Neck supple.  Cardiovascular: Normal rate.  Pulmonary/Chest: No respiratory distress.  Lungs clear, symmetric breath sounds  Abdominal: Soft. He exhibits no distension. There is no tenderness. There is no guarding.  Musculoskeletal: Normal range of motion.  No bruising/swelling.  Pain is worst at proximal 5th metatarsal head but not tender there.  Diffuse mild tenderness of anterior ankle. Good/full ROM at ankle, wiggles toes freely.  Foot is warm.  Neurological: He is alert and oriented to person, place, and time.  Skin: Skin is warm and dry.  No cyanosis  Nursing note and vitals reviewed.    UC Treatments / Results   EKG None Radiology Dg Foot Complete Left  Result Date: 07/18/2017 CLINICAL DATA:  Fall 2 weeks ago with lateral foot pain, initial encounter EXAM: LEFT FOOT - COMPLETE 3+ VIEW COMPARISON:  None. FINDINGS: No acute fracture or dislocation is noted. Calcaneal spurring is noted. No soft tissue changes are seen. IMPRESSION: No acute abnormality is noted. Electronically Signed   By: Alcide Clever M.D.   On: 07/18/2017 12:44    Procedures Procedures (including critical care time) None today  Final Clinical Impressions(s) / UC Diagnoses   Final diagnoses:  Foot sprain, left, initial encounter   X-ray at the urgent care today was negative for bony injury or fracture.  Wear boot as needed for comfort.  Ice for 5-10 minutes several times daily, combined with elevation, may help with pain.  Prescription for a small number of hydrocodone, for emergency pain, was given.  Anticipate gradual improvement in left foot pain over the next several weeks.  Follow-up with your primary care provider or a foot doctor to discuss next steps if not improving as expected.  ED Discharge Orders        Ordered    HYDROcodone-acetaminophen (NORCO/VICODIN) 5-325 MG tablet  4 times daily PRN     07/18/17 1312       Controlled Substance Prescriptions [place] Controlled  Substance Registry consulted? Yes, I have consulted the [place] Controlled Substances Registry for this patient, and feel the risk/benefit ratio today is favorable for proceeding with this prescription for a controlled substance.   Isa RankinMurray, Jaxzen Vanhorn Wilson, MD 07/21/17 [id number]

## 2017-07-18 NOTE — Discharge Instructions (Addendum)
X-ray at the urgent care today was negative for bony injury or fracture.  Wear boot as needed for comfort.  Ice for 5-10 minutes several times daily, combined with elevation, may help with pain.  Prescription for a small number of hydrocodone, for emergency pain, was given.  Anticipate gradual improvement in left foot pain over the next several weeks.  Follow-up with your primary care provider or a foot doctor to discuss next steps if not improving as expected.

## 2017-07-18 NOTE — ED Triage Notes (Signed)
Pt sts twisted left foot x 2 weeks ago and still having pain

## 2017-07-19 LAB — LIPID PANEL
CHOL/HDL RATIO: 3.6 ratio (ref 0.0–5.0)
Cholesterol, Total: 125 mg/dL (ref 100–199)
HDL: 35 mg/dL — ABNORMAL LOW (ref >39)
LDL CALC: 65 mg/dL (ref 0–99)
Triglycerides: 124 mg/dL (ref 0–149)
VLDL CHOLESTEROL CAL: 25 mg/dL (ref 5–40)

## 2017-07-19 LAB — COMPREHENSIVE METABOLIC PANEL
ALK PHOS: 120 IU/L — AB (ref 39–117)
ALT: 16 IU/L (ref 0–44)
AST: 18 IU/L (ref 0–40)
Albumin/Globulin Ratio: 1.7 (ref 1.2–2.2)
Albumin: 4.3 g/dL (ref 3.5–5.5)
BUN/Creatinine Ratio: 21 — ABNORMAL HIGH (ref 9–20)
BUN: 22 mg/dL (ref 6–24)
Bilirubin Total: 0.7 mg/dL (ref 0.0–1.2)
CALCIUM: 9.6 mg/dL (ref 8.7–10.2)
CO2: 24 mmol/L (ref 20–29)
CREATININE: 1.04 mg/dL (ref 0.76–1.27)
Chloride: 100 mmol/L (ref 96–106)
GFR calc Af Amer: 98 mL/min/{1.73_m2} (ref >59)
GFR, EST NON AFRICAN AMERICAN: 85 mL/min/{1.73_m2} (ref >59)
GLUCOSE: 109 mg/dL — AB (ref 65–99)
Globulin, Total: 2.5 g/dL (ref 1.5–4.5)
Potassium: 4.6 mmol/L (ref 3.5–5.2)
Sodium: 140 mmol/L (ref 134–144)
Total Protein: 6.8 g/dL (ref 6.0–8.5)

## 2017-07-20 ENCOUNTER — Telehealth: Payer: Self-pay | Admitting: *Deleted

## 2017-07-20 NOTE — Telephone Encounter (Signed)
Left detailed lab result on secure voicemail -- any question may call back

## 2017-07-20 NOTE — Telephone Encounter (Signed)
-----   Message from Marykay Lexavid W Harding, MD sent at 07/19/2017 10:11 PM EDT ----- Cholesterol panel looks pretty good. LDL is at goal with 65. Total cholesterol 125 with triglycerides of 124. HDL is a little low at 35, but not bad. Plan continue current dose of Crestor. If not already done so, would recommend reducing aspirin dose to 81 mg from 325 mg  Bryan Lemmaavid Harding, MD

## 2017-08-30 ENCOUNTER — Ambulatory Visit (INDEPENDENT_AMBULATORY_CARE_PROVIDER_SITE_OTHER): Payer: 59 | Admitting: *Deleted

## 2017-08-30 DIAGNOSIS — I255 Ischemic cardiomyopathy: Secondary | ICD-10-CM

## 2017-08-30 NOTE — Progress Notes (Signed)
Remote ICD transmission.   

## 2017-09-02 ENCOUNTER — Encounter: Payer: Self-pay | Admitting: Cardiology

## 2017-09-14 ENCOUNTER — Encounter: Payer: Self-pay | Admitting: Neurology

## 2017-09-15 LAB — CUP PACEART REMOTE DEVICE CHECK
Battery Remaining Longevity: 38 mo
Battery Remaining Percentage: 38 %
Battery Voltage: 2.9 V
Brady Statistic AP VP Percent: 1 %
HIGH POWER IMPEDANCE MEASURED VALUE: 97 Ohm
HighPow Impedance: 97 Ohm
Implantable Lead Implant Date: 20130410
Implantable Lead Location: 753860
Implantable Pulse Generator Implant Date: 20130410
Lead Channel Impedance Value: 430 Ohm
Lead Channel Pacing Threshold Pulse Width: 0.5 ms
Lead Channel Sensing Intrinsic Amplitude: 12 mV
Lead Channel Setting Pacing Amplitude: 2 V
Lead Channel Setting Pacing Amplitude: 2.5 V
MDC IDC LEAD IMPLANT DT: 20130410
MDC IDC LEAD LOCATION: 753859
MDC IDC MSMT LEADCHNL RA IMPEDANCE VALUE: 560 Ohm
MDC IDC MSMT LEADCHNL RA PACING THRESHOLD AMPLITUDE: 1 V
MDC IDC MSMT LEADCHNL RA SENSING INTR AMPL: 1.1 mV
MDC IDC MSMT LEADCHNL RV PACING THRESHOLD AMPLITUDE: 1.25 V
MDC IDC MSMT LEADCHNL RV PACING THRESHOLD PULSEWIDTH: 0.5 ms
MDC IDC PG SERIAL: 1041794
MDC IDC SESS DTM: 20190527060016
MDC IDC SET LEADCHNL RV PACING PULSEWIDTH: 0.5 ms
MDC IDC SET LEADCHNL RV SENSING SENSITIVITY: 0.5 mV
MDC IDC STAT BRADY AP VS PERCENT: 6 %
MDC IDC STAT BRADY AS VP PERCENT: 1 %
MDC IDC STAT BRADY AS VS PERCENT: 94 %
MDC IDC STAT BRADY RA PERCENT PACED: 5.4 %
MDC IDC STAT BRADY RV PERCENT PACED: 1 %

## 2017-09-16 ENCOUNTER — Other Ambulatory Visit: Payer: Self-pay | Admitting: Cardiology

## 2017-09-19 ENCOUNTER — Ambulatory Visit (INDEPENDENT_AMBULATORY_CARE_PROVIDER_SITE_OTHER): Payer: Managed Care, Other (non HMO) | Admitting: Neurology

## 2017-09-19 ENCOUNTER — Encounter: Payer: Self-pay | Admitting: Neurology

## 2017-09-19 VITALS — BP 131/82 | HR 84 | Ht 71.0 in | Wt 310.0 lb

## 2017-09-19 DIAGNOSIS — G4733 Obstructive sleep apnea (adult) (pediatric): Secondary | ICD-10-CM | POA: Diagnosis not present

## 2017-09-19 DIAGNOSIS — Z9989 Dependence on other enabling machines and devices: Secondary | ICD-10-CM | POA: Diagnosis not present

## 2017-09-19 NOTE — Patient Instructions (Addendum)

## 2017-09-19 NOTE — Progress Notes (Signed)
Subjective:    Patient ID: Brett Drake is a 47 y.o. male.  HPI     Interim history:   Mr. Rauch is a 47 year old right-handed gentleman with an underlying medical history of type 2 diabetes, chronic combined systolic and diastolic heart failure, ischemic cardiomyopathy, morbid obesity, and coronary artery disease, status post MI in 2010 at age 30, status post pacemaker/defibrillator placement in 2013 and s/p cardiac stent placements, as well as CABG, who presents for follow-up consultation of his obstructive sleep apnea as well as history of paresthesias. The patient is unaccompanied today. I last saw him on 09/15/2016, at which time he was compliant with CPAP. He reported tingling sensation in different locations. He was referred by his endocrinologist for a new problem of paresthesias. He had no one-sided weakness or actual numbness. One leg would feel heavy, than the other. I suggested further workup with head CT, EMG/nerve conduction test as well as EEG. His test results were benign and we called him last year with the results.  Today, 09/19/2017: I reviewed his CPAP compliance data from 08/16/2017 through 09/14/2017 which is a total of 30 days, during which time he used his CPAP every night with percent used days greater than 4 hours at 100%, indicating superb compliance with an average usage of 8 hours and 35 minutes, residual AHI at goal at 1.7 per hour, leak acceptable with the 95th percentile at 7.2 L/m on a pressure of 11 cm with EPR of 3. He reports doing well, CPAP is going well. Had twisted his ankle a few months ago, was in a boot, no Fx. No longer take hydrocodone. His weight has been fluctuating. Tingling went away, after he stopped his skin lotion.  The patient's allergies, current medications, family history, past medical history, past social history, past surgical history and problem list were reviewed and updated as appropriate.    Previously (copied from previous  notes for reference):    09/15/16: He reports that for the past 2 months, he has had tingling in different places. Sometimes it is a painful pins and needles like sensation, no actual numbness reported, no weakness. Sometimes his right leg or left leg feel heavy. He has sparing of the neck and upper shoulder area and face, in particular, denies one-sided weakness, one-sided numbness, droopy face or slurring of speech or recurrent headaches. He has no complaints about the CPAP, is compliant with CPAP, compliance percentage between 08/15/2016 and 09/13/2016 is 97%, AHI at goal at 1.6 per hour on average, leak acceptable, pressure still at 11 cm with EPR of 3. Of note, he had some itching sensation at the scars where he had drainage inserted for his surgery. He had open heart surgery in September 2017. He had a two-vessel CABG. His A1c recently was 8.8. He admits that he was drinking sodas. His weight has been fluctuating. He currently has a tingling sensation across the left abdominal area. He denies any weakness or tingling in his feet. He has had no balance problems or falls.     I reviewed his CPAP compliance data from 11/03/2015 through 12/02/2015 which is a total of 30 days, during which time he used his machine every night with percent used days greater than 4 hours at 97%, indicating excellent compliance with an average usage of 6 hours and 46 minutes, residual AHI 3 per hour, leak low with the 95th percentile at 2.5 L/m on a pressure of 11 cm with EPR of 3.   I saw him  on 08/29/2014, at which time we talked about the sleep test results and his compliance data with CPAP therapy. He reported that he had some difficulty adjusting to CPAP therapy but he was getting better. He had used CPAP fairly consistently in the previous 2 weeks and preferred the nasal mask. He felt better rested. He felt that he had more daytime energy and overall was quite pleased with how he was doing. He was able to lose some weight.  He was scheduled for a colonoscopy soon. His hemoglobin A1c had come down from 11 to 8.8. I encouraged him to be fully compliant with treatment. I commended him for trying and the fact that he had overall done better than in the past when he was tried on CPAP.     I reviewed his CPAP compliance data from 11/03/2014 through 12/02/2014 which is a total of 30 days during which time he used his machine every night with percent used days greater than 4 hours at 90%, indicating excellent compliance with an average usage of 5 hours and 47 minutes, residual AHI at 3.4 per hour, leak low with the 95th percentile at 4.7 L/m on a pressure of 11 cm with EPR of 3.   I first met him on 05/06/2014 at the request of his cardiologist, at which time he reported a prior diagnosis of OSA but intolerance to CPAP in the past. He reported weight gain. He had also had an abnormal overnight pulse oximetry test through his cardiologist's which I reviewed at the time. I invited him back for sleep study. He had a split-night sleep study on 06/24/2014 and went over his test results with him in detail today. His baseline sleep efficiency of was reduced at 65.4% with a latency to sleep of 16.5 minutes and wake after sleep onset of 26 minutes with moderate sleep fragmentation noted. He had absence of slow-wave sleep and absence of REM sleep during the baseline portion of the study. He had frequent PVCs and PACs on EKG. He had moderate snoring. Total AHI was highly elevated at 100.6 per hour. Average oxygen saturation was 90%, nadir was 70%. He was therefore titrated on CPAP during the later portion of the study. His arousal index improved. He achieved slow-wave sleep and REM sleep. Average oxygen saturations improved to 94%, nadir was 85%. He was titrated on CPAP from 5 cm to 10 cm of water pressure, AHI was reduced to 5.9 events per hour at the final pressure with supine REM sleep achieved. Based on the test results I prescribed CPAP therapy  for home use at a pressure of 11 cm due to residual sleep disordered breathing noted on the final pressure of 10 cm.   I reviewed his CPAP compliance data from 07/29/2014 through 08/27/2014 which is a total of 30 days during which time he used his machine 27 days with percent used days rated and 4 hours at 47%, indicating suboptimal compliance with an average usage for all nights of 3 hours and 24 minutes only. Residual AHI good at 2.6 per hour with leak low at 2.2 L/m for the 95th percentile and a pressure of 11 cm with EPR of 2. It does look like he has increased his CPAP usage in the last 2 weeks.     He has a history of snoring and reports daytime somnolence. He had an overnight pulse oximetry test on 04/22/2014: Total test time was 6 hours and 37 minutes, average oxygen saturation 91.3%, lowest oxygen saturation 50%,  time below 88% saturation was 85.4 minutes.   He moved here from [place]. He tells me that he was actually diagnosed with obstructive sleep apnea with a sleep study over a year ago when he was still residing in [place]. He does not have the actual test results. He was tried on CPAP at night. He had trouble tolerating it but would be willing to come back for diagnosis and treatment with another sleep study. He estimates that his sleep study was well over a year ago. He has gained a lot of weight in the last year because of increase in his insulin dose. He has had trouble with diabetes control. He's had diabetes for about 14 years but thankfully does not endorse any complications from diabetes. He has not seen an ophthalmologist in over a year. He had a recent echocardiogram which he reports showed an EF of 50%. His echocardiogram from August 2014 showed an EF of 35%. He does not endorse any chest pain or shortness of breath at this time. He endorses loud snoring, gasping sensations while asleep, witnessed apneic pauses and frequent morning headaches.   Her typical bedtime is reported to be  around 8 to 9 PM and usual wake time is around 4:30 AM. Sleep onset typically occurs within minutes. He reports feeling poorly rested upon awakening. He wakes up on an average 3 to 4 times in the middle of the night and has to go to the bathroom 3 to 4 times on a typical night. He admits to frequent morning headaches. He has to be at work at 5 AM. He works as an Sales promotion account executive at [employer]   He reports excessive daytime somnolence (EDS) and His Epworth Sleepiness Score (ESS) is 18/24 today. He has fallen asleep while driving, in the past, on longer distances. He knows to stop and pull over if he feels sleepy at the wheel. He definitely falls asleep when he is a passenger. He does not take any scheduled. He suspects that his father has sleep apnea but he has not been formally tested. He has a family history of heart disease in his father had heart transplant.   He drinks unsweet tea maybe a glass a day. He drinks alcohol maybe at the most once per month. He quit smoking on 07/07/2008 when he had his heart attack. He had 3 coronary stents placed on 07/08/2008. He denies cataplexy, sleep paralysis, hypnagogic or hypnopompic hallucinations, or sleep attacks. He does not report any vivid dreams, nightmares, dream enactments, or parasomnias, such as sleep walking but does report some sleep talking.    His wife and 56 yo stepdaughter are still in [place]. He does not have a TV in his bedroom.   His Past Medical History Is Significant For: Past Medical History:  Diagnosis Date  . AICD (automatic cardioverter/defibrillator) present 2013   Placed for EF of 35% with inducible VT in 2013 (in [place])  . Anxiety   . Atherosclerosis   . Chronic combined systolic and diastolic CHF, NYHA class 2 and ACA/AHA stage C ([place]) 2010  . Coronary artery disease involving native coronary artery of native heart    ? PRE 2017; 11/2015 - Cath with MV CAD: 100% pRCA, 80% pLAD, 80-90% oCx & oRI, subTO Om1. --> CABG   . Depression   . Diabetes mellitus without complication ([place])    type ll,uncontrolled with renal complications  . GERD (gastroesophageal reflux disease)   . Headache   . History of pneumonia 2016  .  Hyperlipemia   . Hypertension    essential  . Ischemic cardiomyopathy    EF ~35%. Previously diagnosed, but referred for CABG 11/2015. --EF Nov 2018 up to ~40%.  . Morbid obesity ([place])   . Myocardial infarction ([place]) 07/07/2008   In [place]: Acute inferior STEMI with PCI RCA was occluded)  . Sleep apnea    Epworth Score 13  . Urinary frequency    "due to medication"  . Wears glasses     His Past Surgical History Is Significant For: Past Surgical History:  Procedure Laterality Date  . CARDIAC CATHETERIZATION N/A 11/28/2015   Procedure: Left Heart Cath and Coronary Angiography;  Surgeon: [name], MD;  Location: [facility] INVASIVE CV LAB: EF 35% with global hypokinesis/inferior akinesis. pRCA 100% CTO (extensive L-R collaterals). LM - mild Dz. LAD: prox 70-80% (b4 D1) - FFR 0.77. m-dLAD mild diffuse CAD, Small D1 ost 90% diffuse disease. ostRI 90% (small). ostCx - 80-90% (mod diffuse D),  Major OM1 subTO prox.   Marland Kitchen CARDIAC CATHETERIZATION N/A 11/28/2015   Procedure: Intravascular Pressure Wire/FFR Study;  Surgeon: [name], MD;  Location: [place] CV LAB: pLAD 70-80%, FFR 0.77  . COLONOSCOPY    . CORONARY ARTERY BYPASS GRAFT Bilateral 12/19/2015   Procedure: CORONARY ARTERY BYPASS GRAFTING TIMES 2 (CABG X 2 LIMA-LAD, SVG-RI) - using left internal mammary artery and endoscopic left saphenous vein harvest;  Surgeon: Gaye Pollack, MD;  Location: [facility] OR;  Service: Open Heart Surgery;  Laterality: Bilateral;  . EYE SURGERY Left    age 81,to correct lazy eye  . FINGER SURGERY Left    middle finger  . MASTECTOMY Bilateral 1985   gynecomastia  . PACEMAKER INSERTION  2013   St Jude,implantable defibrillator  . stents  07/08/2008   3   . TEE WITHOUT CARDIOVERSION N/A 12/19/2015   Procedure:  TRANSESOPHAGEAL ECHOCARDIOGRAM (TEE);  Surgeon: Gaye Pollack, MD;  Location: [facility] OR;  Service: Open Heart Surgery: Pre-op EF ~35% inferior AK & global HK. -> EF improved post-op (EF not reported).  . TRANSTHORACIC ECHOCARDIOGRAM  02/2017   EF 40%.  For septal axis with inferior akinesis.  GR 1 DD.  Poor acoustic windows.  Mild RV dilation with slightly decreased function. -->  Slight improvement when compared to previous echo --> diastolic function now Gr1 (from 2) & EF up to 40% from 30-35%    His Family History Is Significant For: Family History  Problem Relation Age of Onset  . Heart attack Mother 37  . Kidney failure Father   . Dementia Father   . Heart attack Father 85       Multiple MIs --> severe ICM  . Heart failure Father        transplant at age 62  . Cancer Maternal Grandmother   . Heart disease Maternal Grandfather 34       Began in 3s  . Heart disease Paternal Grandmother        Began in 33s  . Heart disease Paternal Grandfather        Begin in 11s    His Social History Is Significant For: Social History   Socioeconomic History  . Marital status: Married    Spouse name: Judson Roch  . Number of children: 1  . Years of education: college  . Highest education level: Not on file  Occupational History  . Occupation: 1  Social Needs  . Financial resource strain: Not on file  . Food insecurity:  Worry: Not on file    Inability: Not on file  . Transportation needs:    Medical: Not on file    Non-medical: Not on file  Tobacco Use  . Smoking status: Former Smoker    Last attempt to quit: 07/07/2008    Years since quitting: 9.2  . Smokeless tobacco: Never Used  . Tobacco comment: quit in 2010  Substance and Sexual Activity  . Alcohol use: Yes    Alcohol/week: 0.0 oz    Comment: rarely  . Drug use: No  . Sexual activity: Not on file  Lifestyle  . Physical activity:    Days per week: Not on file    Minutes per session: Not on file  . Stress: Not on file   Relationships  . Social connections:    Talks on phone: Not on file    Gets together: Not on file    Attends religious service: Not on file    Active member of club or organization: Not on file    Attends meetings of clubs or organizations: Not on file    Relationship status: Not on file  Other Topics Concern  . Not on file  Social History Narrative  . Not on file    His Allergies Are:  Allergies  Allergen Reactions  . Sulfa Antibiotics Swelling and Rash    SWELLING REACTION UNSPECIFIED   . Penicillins Rash  :   His Current Medications Are:  Outpatient Encounter Medications as of 09/19/2017  Medication Sig  . aspirin 325 MG tablet Take 325 mg by mouth daily.  Marland Kitchen buPROPion (WELLBUTRIN) 75 MG tablet Take 75 mg by mouth daily.  . carvedilol (COREG) 25 MG tablet Take 1 tablet (25 mg total) by mouth 2 (two) times daily with a meal. (Patient taking differently: Take 37.5 mg by mouth 2 (two) times daily with a meal. )  . clopidogrel (PLAVIX) 75 MG tablet Take 1 tablet (75 mg total) by mouth daily.  Marland Kitchen esomeprazole (NEXIUM) 20 MG capsule Take 20 mg by mouth daily at 12 noon.  Marland Kitchen FLUoxetine HCl 60 MG TABS Take 60 mg by mouth daily.  . furosemide (LASIX) 40 MG tablet Take 40 mg by mouth daily.   Marland Kitchen HUMALOG KWIKPEN 200 UNIT/ML SOPN Inject 25-30 Units into the skin 3 (three) times daily before meals. Take 25 units with breakfast & lunch Take 30 units with dinner  . Insulin Glargine (BASAGLAR KWIKPEN) 100 UNIT/ML SOPN Inject 60 Units into the skin 2 (two) times daily.  . metFORMIN (GLUCOPHAGE) 500 MG tablet Take 1-2 tablets (500-1,000 mg total) by mouth 2 (two) times daily with a meal. Take 500 mg every morning  Take 1000 mg at bedtime  . oxymetazoline (AFRIN) 0.05 % nasal spray Place 1 spray into both nostrils 2 (two) times daily.   . potassium chloride SA (K-DUR,KLOR-CON) 20 MEQ tablet Take 1 tablet (20 mEq total) by mouth daily.  . rosuvastatin (CRESTOR) 40 MG tablet Take 40 mg by mouth  daily.  . sacubitril-valsartan (ENTRESTO) 24-26 MG Take 1 tablet by mouth 2 (two) times daily.  . sildenafil (REVATIO) 20 MG tablet May take  1 to 3 tablets as needed  . [DISCONTINUED] HYDROcodone-acetaminophen (NORCO/VICODIN) 5-325 MG tablet Take 1 tablet by mouth 4 (four) times daily as needed.  . [DISCONTINUED] lansoprazole (PREVACID) 15 MG capsule Take 15 mg by mouth at bedtime.    No facility-administered encounter medications on file as of 09/19/2017.   :  Review of Systems:  Out of a complete 14 point review of systems, all are reviewed and negative with the exception of these symptoms as listed below: Review of Systems  Neurological:       Pt presents today to discuss his cpap, which he reports is going well.    Objective:  Neurological Exam  Physical Exam Physical Examination:   Vitals:   09/19/17 1335  BP: 131/82  Pulse: 84    General Examination: The patient is a very pleasant 47 y.o. male in no acute distress. He appears well-developed and well-nourished and well groomed.   HEENT:Normocephalic, atraumatic, pupils are equal, round and reactive to light and accommodation.corrective eyeglasses supplies. Mild lazy eye noted. Extraocular tracking is good without limitation to gaze excursion or nystagmus noted. Normal smooth pursuit is noted. Hearing is grossly intact. Face is symmetric with normal facial animation and normal facial sensation. Speech is clear with no dysarthria noted. There is no hypophonia. There is no lip, neck/head, jaw or voice tremor. Neck is supple with full range of passive and active motion. There are no carotid bruits on auscultation. Oropharynx exam reveals: mild mouth dryness, adequate dental hygiene and moderate airway crowding. Mallampati is class III. Tongue protrudes centrally and palate elevates symmetrically.   Chest:Clear to auscultation without wheezing, rhonchi or crackles noted. Sternotomy scare and 3 smaller scars under the sternum.    Heart:S1+S2+0, regular and normal without murmurs, rubs or gallops noted.   Abdomen:Soft, non-tender and non-distended with normal bowel sounds appreciated on auscultation.  Extremities:There is no pitting edema in the distal lower extremities bilaterally. Pedal pulses are intact.  Skin: Warm and dry without trophic changes noted.   Musculoskeletal: exam reveals no obvious joint deformities, tenderness or joint swelling or erythema. L ankle mildly wider than R.  Neurologically:  Mental status: The patient is awake, alert and oriented in all 4 spheres. His immediate and remote memory, attention, language skills and fund of knowledge are appropriate. There is no evidence of aphasia, agnosia, apraxia or anomia. Speech is clear with normal prosody and enunciation. Thought process is linear. Mood is normal and affect is normal.  Cranial nerves II - XII are as described above under HEENT exam. In addition: shoulder shrug is normal with equal shoulder height noted.  Motor exam: Normal bulk, strength and tone is noted. There is no tremor. Romberg is negative. Reflexes are 2+ in the UEs and 1+ in the LEs. Fine motor skills and coordination: intact with normal finger taps, normal hand movements, normal rapid alternating patting, normal foot taps and normal foot agility.  Cerebellar testing: No dysmetria or intention tremor. There is no truncal or gait ataxia.  Sensory exam: intact to light touch in the upper and lower extremities.  Gait, station and balance: He stands easily. No veering to one side is noted. No leaning to one side is noted. Posture is age-appropriate and stance is narrow based. Gait shows normal stride length and normal pace. No problems turning are noted.   Assessment and Plan:  In summary, Paiden Cavell is a very pleasant 47 year old malewith an underlying medical history of type 2 diabetes since about age 88 with suboptimal control currently, chronic  combined systolic and diastolic heart failure, ischemic cardiomyopathy, morbid obesity, and coronary artery disease, status post MI in 2010 at age 73, status post pacemaker/defibrillator placement in 2013, s/p cardiac stent placements, and s/p 2 vessel CABG on 12/19/15, who presents for FU consultation of his severe OSA on treatment with CPAP at 11 cm  with ongoing full compliance and good results. He had issues with intermittent tingling last year. Workup with EEG, head CT and EMG and nerve conduction testing showed benign findings. His symptoms have since then resolved after he changed his body lotion. His weight has been fluctuating. His physical exam is stable. He reports a family history of dementia, we talked about the importance of maintaining healthy lifestyle, weight management, secondary prevention etc.  I suggested a one-year follow-up for sleep apnea routinely. He can see one of our nurse practitioners as he has been stable. I renewed his prescription for CPAP related supplies, which we will fax to his DME company. I answered all his questions today and he was in agreement. I spent 20 minutes in total face-to-face time with the patient, more than 50% of which was spent in counseling and coordination of care, reviewing test results, reviewing medication and discussing or reviewing the diagnosis of OSA, its prognosis and treatment options. Pertinent laboratory and imaging test results that were available during this visit with the patient were reviewed by me and considered in my medical decision making (see chart for details).

## 2017-11-10 ENCOUNTER — Other Ambulatory Visit: Payer: Self-pay

## 2017-11-10 MED ORDER — SACUBITRIL-VALSARTAN 24-26 MG PO TABS: 1.0000 | ORAL_TABLET | Freq: Two times a day (BID) | ORAL | 2 refills | Status: DC

## 2017-11-29 ENCOUNTER — Ambulatory Visit (INDEPENDENT_AMBULATORY_CARE_PROVIDER_SITE_OTHER): Payer: 59 | Admitting: *Deleted

## 2017-11-29 DIAGNOSIS — I255 Ischemic cardiomyopathy: Secondary | ICD-10-CM

## 2017-11-29 DIAGNOSIS — I5042 Chronic combined systolic (congestive) and diastolic (congestive) heart failure: Secondary | ICD-10-CM

## 2017-11-29 NOTE — Progress Notes (Signed)
Remote ICD transmission.   

## 2017-12-01 ENCOUNTER — Encounter: Payer: Self-pay | Admitting: Cardiology

## 2017-12-23 ENCOUNTER — Ambulatory Visit (INDEPENDENT_AMBULATORY_CARE_PROVIDER_SITE_OTHER): Payer: 59 | Admitting: Cardiology

## 2017-12-23 ENCOUNTER — Encounter: Payer: Self-pay | Admitting: Cardiology

## 2017-12-23 VITALS — BP 112/72 | HR 79 | Ht 71.0 in | Wt 313.6 lb

## 2017-12-23 DIAGNOSIS — Z6841 Body Mass Index (BMI) 40.0 and over, adult: Secondary | ICD-10-CM

## 2017-12-23 DIAGNOSIS — I5042 Chronic combined systolic (congestive) and diastolic (congestive) heart failure: Secondary | ICD-10-CM

## 2017-12-23 DIAGNOSIS — E1169 Type 2 diabetes mellitus with other specified complication: Secondary | ICD-10-CM | POA: Diagnosis not present

## 2017-12-23 DIAGNOSIS — I2510 Atherosclerotic heart disease of native coronary artery without angina pectoris: Secondary | ICD-10-CM | POA: Diagnosis not present

## 2017-12-23 DIAGNOSIS — E785 Hyperlipidemia, unspecified: Secondary | ICD-10-CM

## 2017-12-23 DIAGNOSIS — I255 Ischemic cardiomyopathy: Secondary | ICD-10-CM

## 2017-12-23 DIAGNOSIS — I10 Essential (primary) hypertension: Secondary | ICD-10-CM | POA: Diagnosis not present

## 2017-12-23 NOTE — Progress Notes (Signed)
PCP: System, Provider Not In  CT Surgeon - Dr. Laneta Simmers  Clinic Note: Chief Complaint  Patient presents with  . Follow-up    No complaints  . Coronary Artery Disease  . Cardiomyopathy    Ischemic; class I heart failure    HPI: Brett Drake is a 47 y.o. male with a long-standing CAD w/ ischemic CM who presents today for 17-month followup visit:  He was originally patient of Dr. Jeanella Cara - transferred to [facility] in summer 2018.  He transferred care to [facility] heart care in June 2018.  Long history of CAD  He had PCI to the occluded RCA in April 2010 in [place] when he presented with an acute Inf STEMI.  ICD placed in 2013 for EF 35% and inducible VT.  10/2015: Abnormal thoracic impedance with his ICD, -> Myoview Nuc ST => HIGH RISK w/ very large severe defect in inferior inferolateral and inferior wall. EF was estimated 31%. Inferior akinesis.  8/20017 Cardiac catheterization: Severe CAD with CTO of RCA & OM1 with severe LAD, Cx  & RI disease -> CABG.  CABGx2: LIMA-LAD, SVG-RI); Cx & rPDA not amenable for bypass. Extensive inferior scar.  Echo post CABG - EF ~40%. Inferior Akinesis. Gr1 D. Mild RV dilation with decreased function (only modest increase in EF)  Also has PMH: DM2-On Insulin & Metformin, hypertension and hyperlipidemia, obesity & severe OSA on CPAP.   Brett Drake was last seen back in late in March 2019   Recent Hospitalizations: none  Studies Personally Reviewed - (if available, images/films reviewed: From Epic Chart or Care Everywhere)  none  Interval History: Brett Drake presents here today doing well with no recurrent symptoms of heart failure or angina.  His weights have remained stable.  He was open to barely get more exercise, but because of knee pain he is really been limited as far as any routine exercise.  But he does work doing some significant exertion, carrying 50 to 70 pound bags back-and-forth without any major problems.  No anginal  symptoms. No heart failure symptoms of PND, orthopnea or edema.  He is pretty much able to do whatever he wants to do without having dyspnea.  No sensation of rapid irregular heartbeats or palpitations. He says he still wears his compression stockings and that really helps his swelling --> perhaps the most notable improvement when he comes with his edema and volume status has been switching over to [place].  His glycemic control is improved dramatically as has his requirement for additional Lasix.  Now not using any additional doses.Marland Kitchen  He is now routinely using his CPAP. Now he is back at work, he is not really doing the exercise routine that he been doing before.  He is limited by his knee and back pain and now time constraints from work. No resting or exertional dyspnea or anginal CP.   Remains relatively asymptomatic - Class I CHF without angina  Cardiovascular Review of Symptoms: no chest pain or dyspnea on exertion positive for - edema, irregular heartbeat and erectile dysfxn-edema has notably improved since starting Jardiance. negative for - orthopnea, palpitations, paroxysmal nocturnal dyspnea, rapid heart rate, shortness of breath or Syncope/near syncope, TIA/amaurosis fugax, claudication   ROS: A comprehensive was performed. Review of Systems  Constitutional: Negative for malaise/fatigue and weight loss (His weights go up and down. He is a hard time losing his weight).  HENT: Negative for nosebleeds.   Respiratory: Negative for cough and wheezing.   Cardiovascular: Negative.  He doesn't have chest pain, just a popping sensation of the sternotomy site.  Gastrointestinal: Negative for blood in stool and melena.  Genitourinary: Negative for hematuria.  Musculoskeletal: Positive for back pain and joint pain (knees & hips). Negative for myalgias.  Neurological: Positive for tingling (Bilateral legs and feet neuropathy symptoms.  Not all the time, but limits activity.). Negative for  dizziness.  Psychiatric/Behavioral: Negative for depression and memory loss. The patient does not have insomnia.   All other systems reviewed and are negative.  I have reviewed and (if needed) personally updated the patient's problem list, medications, allergies, past medical and surgical history, social and family history.   Past Medical History:  Diagnosis Date  . AICD (automatic cardioverter/defibrillator) present 2013   Placed for EF of 35% with inducible VT in 2013 (in [place])  . Anxiety   . Chronic combined systolic and diastolic CHF, NYHA class 2 and ACA/AHA stage C (HCC) 2010  . Coronary artery disease involving native coronary artery of native heart    ? PRE 2017 (h/o PCI to RCA 100%); 11/2015 - Cath with MV CAD: 100% pRCA, 80% pLAD, 80-90% oCx & oRI, subTO Om1. --> CABG  . Depression   . 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 GERD (gastroesophageal reflux disease)   . Headache   . History of pneumonia 2016  . Hx of CABGx2    LIMA-LAD, SVG-RI - Native Cx & rPDA not amenable to PCI (extensive inferior scar)  . Hyperlipemia   . Hypertension    essential  . Ischemic cardiomyopathy    EF ~35%. Previously diagnosed, but referred for CABG 11/2015. --EF Nov 2018 up to ~40%. - s/p ICD  . Morbid obesity (HCC)   . Sleep apnea    Epworth Score 13  . ST elevation myocardial infarction (STEMI) of inferior wall (HCC) 07/07/2008   [facility]) - 100% RCA - PCI.  Marland Kitchen Urinary frequency    "due to medication"  . Wears glasses     Past Surgical History:  Procedure Laterality Date  . CARDIAC CATHETERIZATION N/A 11/28/2015   Procedure: Left Heart Cath and Coronary Angiography;  Surgeon: [name], MD;  Location: [facility] INVASIVE CV LAB: EF 35% with global hypokinesis/inferior akinesis. pRCA 100% CTO (extensive L-R collaterals). LM - mild Dz. LAD: prox 70-80% (b4 D1) - FFR 0.77. m-dLAD mild diffuse CAD, Small D1 ost 90% diffuse disease. ostRI  90% (small). ostCx - 80-90% (mod diffuse D),  Major OM1 subTO prox.   Marland Kitchen CARDIAC CATHETERIZATION N/A 11/28/2015   Procedure: Intravascular Pressure Wire/FFR Study;  Surgeon: [name], MD;  Location: [facility] INVASIVE CV LAB: pLAD 70-80%, FFR 0.77  . COLONOSCOPY    . CORONARY ARTERY BYPASS GRAFT Bilateral 12/19/2015   Procedure: CORONARY ARTERY BYPASS GRAFTING TIMES 2 (CABG X 2 LIMA-LAD, SVG-RI - Native Cx & RCA not amenable to bypass) - using left internal mammary artery and endoscopic left saphenous vein harvest;  Surgeon: Alleen Borne, MD;  Location: [facility] OR;  Service: Open Heart Surgery;  Laterality: Bilateral;  . CORONARY STENT INTERVENTION  2010   in [place] - 100% RCA - PCI  . EYE SURGERY Left    age 42,to correct lazy eye  . FINGER SURGERY Left    middle finger  . MASTECTOMY Bilateral 1985   gynecomastia  . PACEMAKER INSERTION  2013   St Jude,implantable defibrillator  . stents  07/08/2008   3   . TEE WITHOUT CARDIOVERSION  N/A 12/19/2015   Procedure: TRANSESOPHAGEAL ECHOCARDIOGRAM (TEE);  Surgeon: Alleen BorneBryan K Bartle, MD;  [facility];  Service: Open Heart Surgery: Pre-op EF ~35% inferior AK & global HK. -> EF improved post-op (EF not reported).  . TRANSTHORACIC ECHOCARDIOGRAM  02/2017   EF 40%.  For septal axis with inferior akinesis.  GR 1 DD.  Poor acoustic windows.  Mild RV dilation with slightly decreased function. -->  Slight improvement when compared to previous echo --> diastolic function now Gr1 (from 2) & EF up to 40% from 30-35%    Cardiac CATH 11/28/2015: (Dr. Jacinto HalimGanji) EF 35% with global HK/ Inf AK. pRCA 100% CTO (extensive L-R collaterals). LM - mild Dz. LAD: prox 70-80% (b4 D1) - FFR 0.77. m-dLAD mild diffuse CAD, Small D1 ost 90%. ostRI 90% (small). ostCx - 80-90% (relative a small caliber, mod diffuse Dz),  Major OM1 subTO prox.       CABG x2 12/19/2015: (Dr. Laneta SimmersBartle - LIMA-LAD, SVG-RI); Cx & rPDA not amenable for bypass. Extensive inferior scar.  Current Meds  Medication Sig  .  aspirin 325 MG tablet Take 81 mg by mouth daily.   Marland Kitchen. buPROPion (WELLBUTRIN) 75 MG tablet Take 75 mg by mouth daily.  . carvedilol (COREG) 25 MG tablet TAKE ONE TABLET BY MOUTH TWICE A DAY WITH A MEAL  . clopidogrel (PLAVIX) 75 MG tablet Take 1 tablet (75 mg total) by mouth daily.  Marland Kitchen. esomeprazole (NEXIUM) 20 MG capsule Take 20 mg by mouth daily at 12 noon.  Marland Kitchen. FLUoxetine HCl 60 MG TABS Take 60 mg by mouth daily.  . furosemide (LASIX) 40 MG tablet Take 40 mg by mouth daily.   Marland Kitchen. HUMALOG KWIKPEN 200 UNIT/ML SOPN Inject 25-30 Units into the skin 3 (three) times daily before meals. Take 25 units with breakfast & lunch Take 30 units with dinner  . Insulin Glargine (BASAGLAR KWIKPEN) 100 UNIT/ML SOPN Inject 60 Units into the skin 2 (two) times daily.  . metFORMIN (GLUCOPHAGE) 500 MG tablet Take 1-2 tablets (500-1,000 mg total) by mouth 2 (two) times daily with a meal. Take 500 mg every morning  Take 1000 mg at bedtime  . oxymetazoline (AFRIN) 0.05 % nasal spray Place 1 spray into both nostrils 2 (two) times daily.   . potassium chloride SA (K-DUR,KLOR-CON) 20 MEQ tablet Take 1 tablet (20 mEq total) by mouth daily.  . rosuvastatin (CRESTOR) 40 MG tablet Take 40 mg by mouth daily.  . sacubitril-valsartan (ENTRESTO) 24-26 MG Take 1 tablet by mouth 2 (two) times daily.  . sildenafil (REVATIO) 20 MG tablet May take  1 to 3 tablets as needed  -Actually he is taking 81 mg aspirin 325 -He also has started [place]ardiance since last visit.  Allergies  Allergen Reactions  . Sulfa Antibiotics Swelling and Rash    SWELLING REACTION UNSPECIFIED   . Penicillins Rash    Social History   Tobacco Use  . Smoking status: Former Smoker    Last attempt to quit: 07/07/2008    Years since quitting: 9.4  . Smokeless tobacco: Never Used  . Tobacco comment: quit in 2010  Substance Use Topics  . Alcohol use: Yes    Alcohol/week: 0.0 standard drinks    Comment: rarely  . Drug use: No   Social History   Social History  Narrative  . Not on file    family history includes Cancer in his maternal grandmother; Dementia in his father; Heart attack (age of onset: 6042) in his father; Heart  attack (age of onset: 73) in his mother; Heart disease in his paternal grandfather and paternal grandmother; Heart disease (age of onset: 73) in his maternal grandfather; Heart failure in his father; Kidney failure in his father.  Wt Readings from Last 3 Encounters:  12/23/17 (!) 313 lb 9.6 oz (142.2 kg)  09/19/17 (!) 310 lb (140.6 kg)  07/01/17 (!) 310 lb (140.6 kg)    PHYSICAL EXAM BP 112/72   Pulse 79   Ht 5\' 11"  (1.803 m)   Wt (!) 313 lb 9.6 oz (142.2 kg)   SpO2 96%   BMI 43.74 kg/m   Physical Exam  Constitutional: He is oriented to person, place, and time. He appears well-developed and well-nourished. No distress.  Still morbidly obese with a BMI of 43.  Well-groomed.  HENT:  Head: Normocephalic and atraumatic.  Eyes:  Both eyes "lazy" R>L  Neck: Normal range of motion. No hepatojugular reflux and no JVD (Unable to assess) present. Carotid bruit is not present.  Cardiovascular: Normal rate, regular rhythm, S1 normal, S2 normal and normal pulses.  No extrasystoles are present. PMI is not displaced (difficult to palpate). Exam reveals gallop, S4 and distant heart sounds.  No murmur heard. Pulmonary/Chest: Effort normal and breath sounds normal. No respiratory distress. He has no wheezes. He has no rales.  Abdominal: Soft. Bowel sounds are normal. He exhibits no distension. There is no tenderness.  Musculoskeletal: Normal range of motion. He exhibits no edema (Trivial- - with mild left> right small varicose veins and spider veins).  Neurological: He is alert and oriented to person, place, and time.  Skin:  Mild venous stasis changes with spider nevi.  Psychiatric: He has a normal mood and affect. His behavior is normal. Judgment and thought content normal.  Nursing note and vitals reviewed.    Adult ECG Report   Rate: 84 ;  Rhythm: normal sinus rhythm and Poor anterior R wave progression suggests possible anterior infarct, age undetermined.  Otherwise normal axis, intervals and durations;   Narrative Interpretation: Relatively normal EKG - no change   Other studies Reviewed: Additional studies/ records that were reviewed today include:  Recent Labs:   Lab Results  Component Value Date   CREATININE 1.04 07/18/2017   BUN 22 07/18/2017   NA 140 07/18/2017   K 4.6 07/18/2017   CL 100 07/18/2017   CO2 24 07/18/2017   Lab Results  Component Value Date   CHOL 125 07/18/2017   HDL 35 (L) 07/18/2017   LDLCALC 65 07/18/2017   TRIG 124 07/18/2017   CHOLHDL 3.6 07/18/2017    ASSESSMENT / PLAN: Problem List Items Addressed This Visit    Chronic combined systolic and diastolic heart failure, NYHA class 1 (HCC) (Chronic)    Modest improvement in EF post CABG, but significantly improved symptoms overall.  He remains on stable dose of carvedilol and Entresto.  No room to titrate up medications. Since converting to [place], he is noted that his insulin requirement has dramatically reduced, as has his additional Lasix dose requirement.  He is on standing dose of Lasix not requiring additional PRN. No change to current meds.  Continue to follow ICD thoracic impedance values.      Coronary artery disease involving native coronary artery of native heart without angina pectoris - Primary (Chronic)    Severe native CAD with essentially RCA occlusion as well as OM1 occlusion not amenable to grafting.  Status post two-vessel CABG with no recurrent anginal symptoms.  Remains on high-dose  beta-blocker as well as rosuvastatin.  Need to clarify that he is actually taking 81 mg of aspirin as opposed to 325 mg.  Mostly because of concern for possible large inferior inferolateral infarct, I would be reluctant to check a stress test unless he has recurrent symptoms.      Essential hypertension (Chronic)     Excellent control.  No change to current dose of Entresto and carvedilol.  I do not expect that he will be able to tolerate titrating up Entresto.      Hyperlipidemia associated with type 2 diabetes mellitus (HCC) target LDL<50, goal<70 (Chronic)    Lipids look great on current dose of Crestor. LDL 65, target would be less than 50.  We will continue current dose of Crestor for now.  I hope with improved glycemic control we will see some more notable improvement in follow-up next year.      Ischemic cardiomyopathy (Chronic)    Long-standing diagnosis dating back to 2013.  EF now 40%.  ICD in place.  On stable dose of Entresto and carvedilol.  Euvolemic with standing low-dose furosemide ever since converting to Jardiance.      Morbid obesity with body mass index (BMI) of 40.0 to 44.9 in adult [facility]) (Chronic)    He is sort of reached a plateau with weight loss.  I encouraged him to continue trying to exercise despite his back and knee pains.  Needs to watch his diet and try to get into some type of exercise.        Current medicines are reviewed at length with the patient today. (+/- concerns) none The following changes have been made: none  Patient Instructions  NO CHANGES WITH CURRENT MEDICATIONS     Your physician wants you to follow-up in 6 MONTH WITH DR Gus Littler. You will receive a reminder letter in the mail two months in advance. If you don't receive a letter, please call our office to schedule the follow-up appointment.   If you need a refill on your cardiac medications before your next appointment, please call your pharmacy.   Studies Ordered:   No orders of the defined types were placed in this encounter.     Bryan Lemma, M.D., M.S. Interventional Cardiologist   Pager # [phone] Phone # [phone] [address] [place], [place] [address]

## 2017-12-23 NOTE — Patient Instructions (Signed)
NO CHANGES WITH CURRENT MEDICATIONS     Your physician wants you to follow-up in 6 MONTH WITH DR HARDING. You will receive a reminder letter in the mail two months in advance. If you don't receive a letter, please call our office to schedule the follow-up appointment.   If you need a refill on your cardiac medications before your next appointment, please call your pharmacy.

## 2017-12-26 ENCOUNTER — Encounter: Payer: Self-pay | Admitting: Cardiology

## 2017-12-26 NOTE — Assessment & Plan Note (Signed)
Excellent control.  No change to current dose of Entresto and carvedilol.  I do not expect that he will be able to tolerate titrating up Entresto.

## 2017-12-26 NOTE — Assessment & Plan Note (Signed)
Long-standing diagnosis dating back to 2013.  EF now 40%.  ICD in place.  On stable dose of Entresto and carvedilol.  Euvolemic with standing low-dose furosemide ever since converting to Jardiance.

## 2017-12-26 NOTE — Assessment & Plan Note (Signed)
Severe native CAD with essentially RCA occlusion as well as OM1 occlusion not amenable to grafting.  Status post two-vessel CABG with no recurrent anginal symptoms.  Remains on high-dose beta-blocker as well as rosuvastatin.  Need to clarify that he is actually taking 81 mg of aspirin as opposed to 325 mg.  Mostly because of concern for possible large inferior inferolateral infarct, I would be reluctant to check a stress test unless he has recurrent symptoms.

## 2017-12-26 NOTE — Assessment & Plan Note (Signed)
He is sort of reached a plateau with weight loss.  I encouraged him to continue trying to exercise despite his back and knee pains.  Needs to watch his diet and try to get into some type of exercise.

## 2017-12-26 NOTE — Assessment & Plan Note (Signed)
Modest improvement in EF post CABG, but significantly improved symptoms overall.  He remains on stable dose of carvedilol and Entresto.  No room to titrate up medications. Since converting to AbbottJardiance, he is noted that his insulin requirement has dramatically reduced, as has his additional Lasix dose requirement.  He is on standing dose of Lasix not requiring additional PRN. No change to current meds.  Continue to follow ICD thoracic impedance values.

## 2017-12-26 NOTE — Assessment & Plan Note (Signed)
Lipids look great on current dose of Crestor. LDL 65, target would be less than 50.  We will continue current dose of Crestor for now.  I hope with improved glycemic control we will see some more notable improvement in follow-up next year.

## 2018-01-02 LAB — CUP PACEART REMOTE DEVICE CHECK
Battery Voltage: 2.89 V
Brady Statistic AP VP Percent: 1 %
Brady Statistic AP VS Percent: 8.4 %
Brady Statistic AS VP Percent: 1 %
Brady Statistic AS VS Percent: 91 %
Brady Statistic RA Percent Paced: 7.4 %
HighPow Impedance: 98 Ohm
HighPow Impedance: 98 Ohm
Implantable Lead Location: 753859
Implantable Lead Location: 753860
Implantable Pulse Generator Implant Date: 20130410
Lead Channel Impedance Value: 580 Ohm
Lead Channel Pacing Threshold Amplitude: 1.25 V
Lead Channel Pacing Threshold Pulse Width: 0.5 ms
Lead Channel Sensing Intrinsic Amplitude: 12 mV
Lead Channel Setting Pacing Amplitude: 2 V
Lead Channel Setting Pacing Amplitude: 2.5 V
Lead Channel Setting Pacing Pulse Width: 0.5 ms
MDC IDC LEAD IMPLANT DT: 20130410
MDC IDC LEAD IMPLANT DT: 20130410
MDC IDC MSMT BATTERY REMAINING LONGEVITY: 36 mo
MDC IDC MSMT BATTERY REMAINING PERCENTAGE: 36 %
MDC IDC MSMT LEADCHNL RA PACING THRESHOLD AMPLITUDE: 1 V
MDC IDC MSMT LEADCHNL RA SENSING INTR AMPL: 1 mV
MDC IDC MSMT LEADCHNL RV IMPEDANCE VALUE: 430 Ohm
MDC IDC MSMT LEADCHNL RV PACING THRESHOLD PULSEWIDTH: 0.5 ms
MDC IDC SESS DTM: 20190826060019
MDC IDC SET LEADCHNL RV SENSING SENSITIVITY: 0.5 mV
MDC IDC STAT BRADY RV PERCENT PACED: 1 %
Pulse Gen Serial Number: 1041794

## 2018-02-28 ENCOUNTER — Ambulatory Visit (INDEPENDENT_AMBULATORY_CARE_PROVIDER_SITE_OTHER): Payer: 59

## 2018-02-28 DIAGNOSIS — I255 Ischemic cardiomyopathy: Secondary | ICD-10-CM | POA: Diagnosis not present

## 2018-02-28 DIAGNOSIS — I5042 Chronic combined systolic (congestive) and diastolic (congestive) heart failure: Secondary | ICD-10-CM

## 2018-02-28 NOTE — Progress Notes (Signed)
Remote ICD transmission.   

## 2018-04-21 LAB — CUP PACEART REMOTE DEVICE CHECK
Battery Remaining Longevity: 35 mo
Battery Remaining Percentage: 35 %
Battery Voltage: 2.87 V
Brady Statistic AS VP Percent: 1 %
Brady Statistic RA Percent Paced: 7.6 %
Date Time Interrogation Session: 20191125070018
HIGH POWER IMPEDANCE MEASURED VALUE: 99 Ohm
HighPow Impedance: 99 Ohm
Implantable Lead Location: 753859
Implantable Pulse Generator Implant Date: 20130410
Lead Channel Impedance Value: 430 Ohm
Lead Channel Impedance Value: 590 Ohm
Lead Channel Pacing Threshold Amplitude: 1.25 V
Lead Channel Sensing Intrinsic Amplitude: 12 mV
Lead Channel Setting Pacing Amplitude: 2 V
Lead Channel Setting Pacing Pulse Width: 0.5 ms
MDC IDC LEAD IMPLANT DT: 20130410
MDC IDC LEAD IMPLANT DT: 20130410
MDC IDC LEAD LOCATION: 753860
MDC IDC MSMT LEADCHNL RA PACING THRESHOLD AMPLITUDE: 1 V
MDC IDC MSMT LEADCHNL RA PACING THRESHOLD PULSEWIDTH: 0.5 ms
MDC IDC MSMT LEADCHNL RA SENSING INTR AMPL: 1 mV
MDC IDC MSMT LEADCHNL RV PACING THRESHOLD PULSEWIDTH: 0.5 ms
MDC IDC SET LEADCHNL RV PACING AMPLITUDE: 2.5 V
MDC IDC SET LEADCHNL RV SENSING SENSITIVITY: 0.5 mV
MDC IDC STAT BRADY AP VP PERCENT: 1 %
MDC IDC STAT BRADY AP VS PERCENT: 9.1 %
MDC IDC STAT BRADY AS VS PERCENT: 90 %
MDC IDC STAT BRADY RV PERCENT PACED: 1 %
Pulse Gen Serial Number: 1041794

## 2018-04-24 ENCOUNTER — Other Ambulatory Visit: Payer: Self-pay | Admitting: *Deleted

## 2018-04-24 MED ORDER — POTASSIUM CHLORIDE CRYS ER 20 MEQ PO TBCR: 20.0000 meq | EXTENDED_RELEASE_TABLET | Freq: Every day | ORAL | 2 refills | Status: DC

## 2018-05-30 ENCOUNTER — Ambulatory Visit (INDEPENDENT_AMBULATORY_CARE_PROVIDER_SITE_OTHER): Payer: 59 | Admitting: *Deleted

## 2018-05-30 DIAGNOSIS — I255 Ischemic cardiomyopathy: Secondary | ICD-10-CM | POA: Diagnosis not present

## 2018-05-30 DIAGNOSIS — I5042 Chronic combined systolic (congestive) and diastolic (congestive) heart failure: Secondary | ICD-10-CM

## 2018-05-31 LAB — CUP PACEART REMOTE DEVICE CHECK
Battery Remaining Longevity: 33 mo
Brady Statistic AP VS Percent: 8.7 %
Brady Statistic AS VS Percent: 90 %
Brady Statistic RA Percent Paced: 6.8 %
HIGH POWER IMPEDANCE MEASURED VALUE: 89 Ohm
HIGH POWER IMPEDANCE MEASURED VALUE: 89 Ohm
Implantable Lead Location: 753859
Implantable Pulse Generator Implant Date: 20130410
Lead Channel Pacing Threshold Amplitude: 1.25 V
Lead Channel Sensing Intrinsic Amplitude: 1 mV
Lead Channel Setting Pacing Amplitude: 2 V
Lead Channel Setting Pacing Pulse Width: 0.5 ms
MDC IDC LEAD IMPLANT DT: 20130410
MDC IDC LEAD IMPLANT DT: 20130410
MDC IDC LEAD LOCATION: 753860
MDC IDC MSMT BATTERY REMAINING PERCENTAGE: 33 %
MDC IDC MSMT BATTERY VOLTAGE: 2.87 V
MDC IDC MSMT LEADCHNL RA IMPEDANCE VALUE: 580 Ohm
MDC IDC MSMT LEADCHNL RA PACING THRESHOLD AMPLITUDE: 1 V
MDC IDC MSMT LEADCHNL RA PACING THRESHOLD PULSEWIDTH: 0.5 ms
MDC IDC MSMT LEADCHNL RV IMPEDANCE VALUE: 400 Ohm
MDC IDC MSMT LEADCHNL RV PACING THRESHOLD PULSEWIDTH: 0.5 ms
MDC IDC MSMT LEADCHNL RV SENSING INTR AMPL: 12 mV
MDC IDC SESS DTM: 20200224070016
MDC IDC SET LEADCHNL RV PACING AMPLITUDE: 2.5 V
MDC IDC SET LEADCHNL RV SENSING SENSITIVITY: 0.5 mV
MDC IDC STAT BRADY AP VP PERCENT: 1 %
MDC IDC STAT BRADY AS VP PERCENT: 1 %
MDC IDC STAT BRADY RV PERCENT PACED: 1 %
Pulse Gen Serial Number: 1041794

## 2018-06-07 ENCOUNTER — Encounter: Payer: Self-pay | Admitting: Cardiology

## 2018-06-07 NOTE — Progress Notes (Signed)
Remote ICD transmission.   

## 2018-06-26 ENCOUNTER — Ambulatory Visit: Payer: 59

## 2018-07-04 ENCOUNTER — Telehealth: Payer: Self-pay | Admitting: *Deleted

## 2018-07-04 NOTE — Telephone Encounter (Signed)
PA request sent to [facility] for [facility] 24/26 via cover my meds. Waiting for determination.

## 2018-07-04 NOTE — Telephone Encounter (Signed)
Received email from [facility] via cover my meds stating no PA required for [facility] 24/26

## 2018-07-04 NOTE — Telephone Encounter (Signed)
-----   Message from Stacy L Green, CMA sent at 07/03/2018  4:21 PM EDT ----- Regarding: PRIOR AUTH KEY:[id number] ENTRESTO 24-26  

## 2018-07-04 NOTE — Telephone Encounter (Signed)
-----   Message from Chana Bode, [place] sent at 07/03/2018  4:21 PM EDT ----- Regarding: PRIOR AUTH RCV:[id number] ENTRESTO 24-26

## 2018-07-07 MED ORDER — SACUBITRIL-VALSARTAN 24-26 MG PO TABS: 1.0000 | ORAL_TABLET | Freq: Two times a day (BID) | ORAL | 5 refills | Status: DC

## 2018-07-07 NOTE — Telephone Encounter (Signed)
Follow up:    Patient would like for medication to be sent to [facility].

## 2018-07-11 ENCOUNTER — Ambulatory Visit: Payer: Self-pay | Admitting: Physical Therapy

## 2018-07-26 ENCOUNTER — Other Ambulatory Visit: Payer: Self-pay | Admitting: Cardiology

## 2018-07-27 NOTE — Telephone Encounter (Signed)
Carvedilol 25 mg refilled. 

## 2018-08-29 ENCOUNTER — Ambulatory Visit (INDEPENDENT_AMBULATORY_CARE_PROVIDER_SITE_OTHER): Payer: 59 | Admitting: *Deleted

## 2018-08-29 DIAGNOSIS — I255 Ischemic cardiomyopathy: Secondary | ICD-10-CM

## 2018-08-29 DIAGNOSIS — I4729 Other ventricular tachycardia: Secondary | ICD-10-CM

## 2018-08-29 DIAGNOSIS — I5042 Chronic combined systolic (congestive) and diastolic (congestive) heart failure: Secondary | ICD-10-CM

## 2018-08-29 DIAGNOSIS — I472 Ventricular tachycardia: Secondary | ICD-10-CM

## 2018-08-29 LAB — CUP PACEART REMOTE DEVICE CHECK
Date Time Interrogation Session: 20200526135353
Implantable Lead Implant Date: 20130410
Implantable Lead Implant Date: 20130410
Implantable Lead Location: 753859
Implantable Lead Location: 753860
Implantable Pulse Generator Implant Date: 20130410
Pulse Gen Serial Number: 1041794

## 2018-09-06 ENCOUNTER — Other Ambulatory Visit: Payer: Self-pay | Admitting: Cardiology

## 2018-09-08 NOTE — Progress Notes (Signed)
Remote ICD transmission.   

## 2018-09-21 ENCOUNTER — Telehealth: Payer: Self-pay

## 2018-09-21 NOTE — Telephone Encounter (Signed)
Spoke with the patient and they have given verbal consent to file insurance and to do a mychart video visit. Mychart video link has been sent to the patient.   

## 2018-09-23 ENCOUNTER — Encounter: Payer: Self-pay | Admitting: Adult Health

## 2018-09-25 ENCOUNTER — Telehealth (INDEPENDENT_AMBULATORY_CARE_PROVIDER_SITE_OTHER): Payer: 59 | Admitting: Adult Health

## 2018-09-25 DIAGNOSIS — G4733 Obstructive sleep apnea (adult) (pediatric): Secondary | ICD-10-CM | POA: Diagnosis not present

## 2018-09-25 DIAGNOSIS — Z9989 Dependence on other enabling machines and devices: Secondary | ICD-10-CM

## 2018-09-25 NOTE — Progress Notes (Signed)
PATIENT: Brett CoffinChristopher Drake DOB: July 04, 1970  REASON FOR VISIT: follow up HISTORY FROM: patient  Virtual Visit via Video Note  I connected with Brett Coffinhristopher Brillhart on 09/25/18 at  3:00 PM EDT by a video enabled telemedicine application located remotely at [facility] and verified that I am speaking with the correct person using two identifiers who was located at their own home.   I discussed the limitations of evaluation and management by telemedicine and the availability of in person appointments. The patient expressed understanding and agreed to proceed.   PATIENT: Brett CoffinChristopher Sui DOB: July 04, 1970  REASON FOR VISIT: follow up HISTORY FROM: patient  HISTORY OF PRESENT ILLNESS: Today 09/25/18:  [name] is a 48 year old male with a history of obstructive sleep apnea on CPAP.  His download indicates that he uses machine 30 out of 30 days for compliance of 100%.  He uses machine greater than 4 hours each night.  On average he uses his machine 8 hours and 23 minutes.  His residual AHI is 2.1 on 11 cm of water with EPR of 3.  He does not have a significant leak.  Reports that he continues to notice the benefit with the CPAP.  HISTORY 09/19/2017: I reviewed his CPAP compliance data from 08/16/2017 through 09/14/2017 which is a total of 30 days, during which time he used his CPAP every night with percent used days greater than 4 hours at 100%, indicating superb compliance with an average usage of 8 hours and 35 minutes, residual AHI at goal at 1.7 per hour, leak acceptable with the 95th percentile at 7.2 L/m on a pressure of 11 cm with EPR of 3. He reports doing well, CPAP is going well. Had twisted his ankle a few months ago, was in a boot, no Fx. No longer take hydrocodone. His weight has been fluctuating. Tingling went away, after he stopped his skin lotion.  REVIEW OF SYSTEMS: Out of a complete 14 system review of symptoms, the patient complains only of the  following symptoms, and all other reviewed systems are negative.  See HPI  ALLERGIES: Allergies  Allergen Reactions  . Sulfa Antibiotics Swelling and Rash    SWELLING REACTION UNSPECIFIED   . Penicillins Rash    HOME MEDICATIONS: Outpatient Medications Prior to Visit  Medication Sig Dispense Refill  . aspirin 325 MG tablet Take 81 mg by mouth daily.     Marland Kitchen. buPROPion (WELLBUTRIN) 75 MG tablet Take 75 mg by mouth daily.    . carvedilol (COREG) 25 MG tablet TAKE ONE TABLET BY MOUTH TWICE A DAY WITH A MEAL 180 tablet 1  . clopidogrel (PLAVIX) 75 MG tablet TAKE ONE TABLET BY MOUTH DAILY 90 tablet 3  . esomeprazole (NEXIUM) 20 MG capsule Take 20 mg by mouth daily at 12 noon.    Marland Kitchen. FLUoxetine HCl 60 MG TABS Take 60 mg by mouth daily.    . furosemide (LASIX) 40 MG tablet Take 40 mg by mouth daily.   11  . HUMALOG KWIKPEN 200 UNIT/ML SOPN Inject 25-30 Units into the skin 3 (three) times daily before meals. Take 25 units with breakfast & lunch Take 30 units with dinner    . Insulin Glargine (BASAGLAR KWIKPEN) 100 UNIT/ML SOPN Inject 60 Units into the skin 2 (two) times daily.    . metFORMIN (GLUCOPHAGE) 500 MG tablet Take 1-2 tablets (500-1,000 mg total) by mouth 2 (two) times daily with a meal. Take 500 mg every morning  Take 1000 mg at  bedtime    . oxymetazoline (AFRIN) 0.05 % nasal spray Place 1 spray into both nostrils 2 (two) times daily.     . potassium chloride SA (K-DUR,KLOR-CON) 20 MEQ tablet Take 1 tablet (20 mEq total) by mouth daily. 90 tablet 2  . rosuvastatin (CRESTOR) 40 MG tablet Take 40 mg by mouth daily.    . sacubitril-valsartan (ENTRESTO) 24-26 MG Take 1 tablet by mouth 2 (two) times daily. 60 tablet 5  . sildenafil (REVATIO) 20 MG tablet May take  1 to 3 tablets as needed 20 tablet 11   No facility-administered medications prior to visit.     PAST MEDICAL HISTORY: Past Medical History:  Diagnosis Date  . AICD (automatic cardioverter/defibrillator) present 2013    Placed for EF of 35% with inducible VT in 2013 (in [place])  . Anxiety   . Chronic combined systolic and diastolic CHF, NYHA class 2 and ACA/AHA stage C ([place]) 2010  . Coronary artery disease involving native coronary artery of native heart    ? PRE 2017 (h/o PCI to RCA 100%); 11/2015 - Cath with MV CAD: 100% pRCA, 80% pLAD, 80-90% oCx & oRI, subTO Om1. --> CABG  . Depression   . 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 GERD (gastroesophageal reflux disease)   . Headache   . History of pneumonia 2016  . Hx of CABGx2    LIMA-LAD, SVG-RI - Native Cx & rPDA not amenable to PCI (extensive inferior scar)  . Hyperlipemia   . Hypertension    essential  . Ischemic cardiomyopathy    EF ~35%. Previously diagnosed, but referred for CABG 11/2015. --EF Nov 2018 up to ~40%. - s/p ICD  . Morbid obesity ([place])   . Sleep apnea    Epworth Score 13  . ST elevation myocardial infarction (STEMI) of inferior wall ([place]) 07/07/2008   [facility]) - 100% RCA - PCI.  Marland Kitchen Urinary frequency    "due to medication"  . Wears glasses     PAST SURGICAL HISTORY: Past Surgical History:  Procedure Laterality Date  . CARDIAC CATHETERIZATION N/A 11/28/2015   Procedure: Left Heart Cath and Coronary Angiography;  Surgeon: [name], MD;  Location: [facility] INVASIVE CV LAB: EF 35% with global hypokinesis/inferior akinesis. pRCA 100% CTO (extensive L-R collaterals). LM - mild Dz. LAD: prox 70-80% (b4 D1) - FFR 0.77. m-dLAD mild diffuse CAD, Small D1 ost 90% diffuse disease. ostRI 90% (small). ostCx - 80-90% (mod diffuse D),  Major OM1 subTO prox.   Marland Kitchen CARDIAC CATHETERIZATION N/A 11/28/2015   Procedure: Intravascular Pressure Wire/FFR Study;  Surgeon: [name], MD;  Location: [facility] CV LAB: pLAD 70-80%, FFR 0.77  . COLONOSCOPY    . CORONARY ARTERY BYPASS GRAFT Bilateral 12/19/2015   Procedure: CORONARY ARTERY BYPASS GRAFTING TIMES 2 (CABG X 2 LIMA-LAD, SVG-RI - Native Cx & RCA  not amenable to bypass) - using left internal mammary artery and endoscopic left saphenous vein harvest;  Surgeon: Gaye Pollack, MD;  Location: [place];  Service: Open Heart Surgery;  Laterality: Bilateral;  . CORONARY STENT INTERVENTION  2010   in [place] - 100% RCA - PCI  . EYE SURGERY Left    age 89,to correct lazy eye  . FINGER SURGERY Left    middle finger  . MASTECTOMY Bilateral 1985   gynecomastia  . PACEMAKER INSERTION  2013   St Jude,implantable defibrillator  . stents  07/08/2008   3   . TEE  WITHOUT CARDIOVERSION N/A 12/19/2015   Procedure: TRANSESOPHAGEAL ECHOCARDIOGRAM (TEE);  Surgeon: Alleen BorneBryan K Bartle, MD;  [facility];  Service: Open Heart Surgery: Pre-op EF ~35% inferior AK & global HK. -> EF improved post-op (EF not reported).  . TRANSTHORACIC ECHOCARDIOGRAM  02/2017   EF 40%.  For septal axis with inferior akinesis.  GR 1 DD.  Poor acoustic windows.  Mild RV dilation with slightly decreased function. -->  Slight improvement when compared to previous echo --> diastolic function now Gr1 (from 2) & EF up to 40% from 30-35%    FAMILY HISTORY: Family History  Problem Relation Age of Onset  . Heart attack Mother [id number]  . Kidney failure Father   . Dementia Father   . Heart attack Father [id number]       Multiple MIs --> severe ICM  . Heart failure Father        transplant at age 48  . Cancer Maternal Grandmother   . Heart disease Maternal Grandfather [id number]       Began in 6560s  . Heart disease Paternal Grandmother        Began in 2460s  . Heart disease Paternal Grandfather        Begin in 8460s    SOCIAL HISTORY: Social History   Socioeconomic History  . Marital status: Married    Spouse name: Maralyn SagoSarah  . Number of children: 1  . Years of education: college  . Highest education level: Not on file  Occupational History  . Occupation: 1  Social Needs  . Financial resource strain: Not on file  . Food insecurity    Worry: Not on file    Inability: Not on file  . Transportation  needs    Medical: Not on file    Non-medical: Not on file  Tobacco Use  . Smoking status: Former Smoker    Quit date: 07/07/2008    Years since quitting: 10.2  . Smokeless tobacco: Never Used  . Tobacco comment: quit in 2010  Substance and Sexual Activity  . Alcohol use: Yes    Alcohol/week: 0.0 standard drinks    Comment: rarely  . Drug use: No  . Sexual activity: Not on file  Lifestyle  . Physical activity    Days per week: Not on file    Minutes per session: Not on file  . Stress: Not on file  Relationships  . Social Musicianconnections    Talks on phone: Not on file    Gets together: Not on file    Attends religious service: Not on file    Active member of club or organization: Not on file    Attends meetings of clubs or organizations: Not on file    Relationship status: Not on file  . Intimate partner violence    Fear of current or ex partner: Not on file    Emotionally abused: Not on file    Physically abused: Not on file    Forced sexual activity: Not on file  Other Topics Concern  . Not on file  Social History Narrative  . Not on file      PHYSICAL EXAM Generalized: Well developed, in no acute distress   Neurological examination  Mentation: Alert oriented to time, place, history taking. Follows all commands speech and language fluent Cranial nerve II-XII:Extraocular movements were full. Facial symmetry noted. uvula tongue midline. Head turning and shoulder shrug  were normal and symmetric. Motor: Good strength throughout subjectively per patient Sensory: Sensory testing is  intact to soft touch on all 4 extremities subjectively per patient Reflexes: UTA  DIAGNOSTIC DATA (LABS, IMAGING, TESTING) - I reviewed patient records, labs, notes, testing and imaging myself where available.  Lab Results  Component Value Date   WBC 12.6 (H) 12/23/2015   HGB 7.9 (L) 12/23/2015   HCT 25.5 (L) 12/23/2015   MCV 81.0 12/23/2015   PLT 257 12/23/2015      Component Value  Date/Time   NA 140 07/18/2017 1110   K 4.6 07/18/2017 1110   CL 100 07/18/2017 1110   CO2 24 07/18/2017 1110   GLUCOSE 109 (H) 07/18/2017 1110   GLUCOSE 166 (H) 12/23/2015 0218   BUN 22 07/18/2017 1110   CREATININE 1.04 07/18/2017 1110   CALCIUM 9.6 07/18/2017 1110   PROT 6.8 07/18/2017 1110   ALBUMIN 4.3 07/18/2017 1110   AST 18 07/18/2017 1110   ALT 16 07/18/2017 1110   ALKPHOS 120 (H) 07/18/2017 1110   BILITOT 0.7 07/18/2017 1110   GFRNONAA 85 07/18/2017 1110   GFRAA 98 07/18/2017 1110   Lab Results  Component Value Date   CHOL 125 07/18/2017   HDL 35 (L) 07/18/2017   LDLCALC 65 07/18/2017   TRIG 124 07/18/2017   CHOLHDL 3.6 07/18/2017   Lab Results  Component Value Date   HGBA1C 8.3 (H) 12/17/2015   No results found for: VITAMINB12 No results found for: TSH    ASSESSMENT AND PLAN 48 y.o. year old male  has a past medical history of AICD (automatic cardioverter/defibrillator) present (2013), Anxiety, Chronic combined systolic and diastolic CHF, NYHA class 2 and ACA/AHA stage C ([facility]) (2010), Coronary artery disease involving native coronary artery of native heart, Depression, Diabetes mellitus without complication (H[facility]), GERD (gastroesophageal reflux disease), Headache, History of pneumonia (2016), Hx of CABGx2, Hyperlipemia, Hypertension, Ischemic cardiomyopathy, Morbid obesity (H[facility]), Sleep apnea, ST elevation myocardial infarction (STEMI) of inferior wall ([facility]) (07/07/2008), Urinary frequency, and Wears glasses. here with :  1.  Obstructive sleep apnea on CPAP  The patient's CPAP download shows excellent compliance and good treatment of his apnea.  He is encouraged to continue using CPAP nightly and greater than 4 hours each night.  He is advised that if his symptoms worsen or he develops new symptoms he should let us know.  He will follow-up in 1 year or sooner if needed    I spent 15 minutes with the patient this time was spent reviewing his CPAP download   Butch PennyMegan  Lateefah Mallery, MSN, NP-C 09/25/2018, 12:11 PM [facility] [address], [address] [place], [place] [address] [phone]

## 2018-09-25 NOTE — Progress Notes (Signed)
I have read the note, and I agree with the clinical assessment and plan.  Shelia Kingsberry K Christle Nolting   

## 2018-11-29 ENCOUNTER — Ambulatory Visit (INDEPENDENT_AMBULATORY_CARE_PROVIDER_SITE_OTHER): Payer: 59 | Admitting: *Deleted

## 2018-11-29 DIAGNOSIS — I255 Ischemic cardiomyopathy: Secondary | ICD-10-CM

## 2018-11-29 DIAGNOSIS — I5042 Chronic combined systolic (congestive) and diastolic (congestive) heart failure: Secondary | ICD-10-CM

## 2018-11-29 LAB — CUP PACEART REMOTE DEVICE CHECK
Battery Remaining Longevity: 29 mo
Battery Remaining Percentage: 28 %
Battery Voltage: 2.84 V
Brady Statistic AP VP Percent: 1 %
Brady Statistic AP VS Percent: 8.8 %
Brady Statistic AS VP Percent: 1 %
Brady Statistic AS VS Percent: 89 %
Brady Statistic RA Percent Paced: 6.5 %
Brady Statistic RV Percent Paced: 1 %
Date Time Interrogation Session: 20200824060015
HighPow Impedance: 100 Ohm
HighPow Impedance: 100 Ohm
Implantable Lead Implant Date: 20130410
Implantable Lead Implant Date: 20130410
Implantable Lead Location: 753859
Implantable Lead Location: 753860
Implantable Pulse Generator Implant Date: 20130410
Lead Channel Impedance Value: 440 Ohm
Lead Channel Impedance Value: 590 Ohm
Lead Channel Pacing Threshold Amplitude: 1 V
Lead Channel Pacing Threshold Amplitude: 1.25 V
Lead Channel Pacing Threshold Pulse Width: 0.5 ms
Lead Channel Pacing Threshold Pulse Width: 0.5 ms
Lead Channel Sensing Intrinsic Amplitude: 1.2 mV
Lead Channel Sensing Intrinsic Amplitude: 12 mV
Lead Channel Setting Pacing Amplitude: 2 V
Lead Channel Setting Pacing Amplitude: 2.5 V
Lead Channel Setting Pacing Pulse Width: 0.5 ms
Lead Channel Setting Sensing Sensitivity: 0.5 mV
Pulse Gen Serial Number: 1041794

## 2018-12-08 ENCOUNTER — Encounter: Payer: Self-pay | Admitting: Cardiology

## 2018-12-08 NOTE — Progress Notes (Signed)
Remote ICD transmission.   

## 2018-12-21 ENCOUNTER — Telehealth: Payer: Self-pay | Admitting: *Deleted

## 2018-12-21 DIAGNOSIS — I5042 Chronic combined systolic (congestive) and diastolic (congestive) heart failure: Secondary | ICD-10-CM

## 2018-12-21 DIAGNOSIS — E785 Hyperlipidemia, unspecified: Secondary | ICD-10-CM

## 2018-12-21 DIAGNOSIS — I2510 Atherosclerotic heart disease of native coronary artery without angina pectoris: Secondary | ICD-10-CM

## 2018-12-21 DIAGNOSIS — E1169 Type 2 diabetes mellitus with other specified complication: Secondary | ICD-10-CM

## 2018-12-21 NOTE — Telephone Encounter (Signed)
-----   Message from Corinna Lines sent at 12/18/2018  9:52 AM EDT ----- Regarding: labs   Brett Drake  This patient wants to know if he needs labs completed before his appointment with Dr. Ellyn Hack in October.  Thanks [facility]

## 2018-12-21 NOTE — Telephone Encounter (Signed)
PATIENT WILL NEED LABWORK PRIOR TO OFFICE VISIT   WILL MAIL LABSLIP - CMP LIPID

## 2018-12-22 NOTE — Telephone Encounter (Signed)
Left message - lab work is needed before upcoming appointment Lab slip will be mailed to patient with instructions. Any question may call back

## 2019-01-03 LAB — LIPID PANEL
Chol/HDL Ratio: 4.2 ratio (ref 0.0–5.0)
Cholesterol, Total: 123 mg/dL (ref 100–199)
HDL: 29 mg/dL — ABNORMAL LOW (ref >39)
LDL Chol Calc (NIH): 70 mg/dL (ref 0–99)
Triglycerides: 137 mg/dL (ref 0–149)
VLDL Cholesterol Cal: 24 mg/dL (ref 5–40)

## 2019-01-03 LAB — COMPREHENSIVE METABOLIC PANEL
ALT: 12 IU/L (ref 0–44)
AST: 11 IU/L (ref 0–40)
Albumin/Globulin Ratio: 1.6 (ref 1.2–2.2)
Albumin: 4.3 g/dL (ref 4.0–5.0)
Alkaline Phosphatase: 117 IU/L (ref 39–117)
BUN/Creatinine Ratio: 16 (ref 9–20)
BUN: 17 mg/dL (ref 6–24)
Bilirubin Total: 1.1 mg/dL (ref 0.0–1.2)
CO2: 25 mmol/L (ref 20–29)
Calcium: 9.9 mg/dL (ref 8.7–10.2)
Chloride: 99 mmol/L (ref 96–106)
Creatinine, Ser: 1.09 mg/dL (ref 0.76–1.27)
GFR calc Af Amer: 92 mL/min/{1.73_m2} (ref >59)
GFR calc non Af Amer: 80 mL/min/{1.73_m2} (ref >59)
Globulin, Total: 2.7 g/dL (ref 1.5–4.5)
Glucose: 169 mg/dL — ABNORMAL HIGH (ref 65–99)
Potassium: 5.2 mmol/L (ref 3.5–5.2)
Sodium: 139 mmol/L (ref 134–144)
Total Protein: 7 g/dL (ref 6.0–8.5)

## 2019-01-10 ENCOUNTER — Telehealth: Payer: Self-pay | Admitting: *Deleted

## 2019-01-10 DIAGNOSIS — E1169 Type 2 diabetes mellitus with other specified complication: Secondary | ICD-10-CM

## 2019-01-10 DIAGNOSIS — I255 Ischemic cardiomyopathy: Secondary | ICD-10-CM

## 2019-01-10 DIAGNOSIS — I5042 Chronic combined systolic (congestive) and diastolic (congestive) heart failure: Secondary | ICD-10-CM

## 2019-01-10 DIAGNOSIS — E785 Hyperlipidemia, unspecified: Secondary | ICD-10-CM

## 2019-01-10 NOTE — Telephone Encounter (Signed)
-----   Message from Leonie Man, MD sent at 01/08/2019 10:39 PM EDT ----- Cholesterol panel is pretty stable.  LDL is gone up to 70 from 65.  I like to recheck this in 6 months.  If it is trending up, we probably need to add more to the rosuvastatin.  Glenetta Hew, MD

## 2019-01-10 NOTE — Telephone Encounter (Signed)
ORDER PLACED FOR LABS IN 6 MONTHS   PATIENT REVIEWED RESULTS VIA MYCHART.

## 2019-01-23 ENCOUNTER — Ambulatory Visit: Payer: 59 | Admitting: Cardiology

## 2019-01-30 ENCOUNTER — Other Ambulatory Visit: Payer: Self-pay | Admitting: Cardiology

## 2019-02-02 NOTE — Telephone Encounter (Signed)
Refilled approved  Until next appointment - 02/15/19

## 2019-02-04 HISTORY — PX: TRANSTHORACIC ECHOCARDIOGRAM: SHX275

## 2019-02-15 ENCOUNTER — Other Ambulatory Visit: Payer: Self-pay

## 2019-02-15 ENCOUNTER — Encounter: Payer: Self-pay | Admitting: Cardiology

## 2019-02-15 ENCOUNTER — Ambulatory Visit (INDEPENDENT_AMBULATORY_CARE_PROVIDER_SITE_OTHER): Payer: 59 | Admitting: Cardiology

## 2019-02-15 VITALS — BP 134/78 | HR 89 | Ht 71.0 in | Wt 299.8 lb

## 2019-02-15 DIAGNOSIS — I4729 Other ventricular tachycardia: Secondary | ICD-10-CM

## 2019-02-15 DIAGNOSIS — I255 Ischemic cardiomyopathy: Secondary | ICD-10-CM | POA: Diagnosis not present

## 2019-02-15 DIAGNOSIS — Z6841 Body Mass Index (BMI) 40.0 and over, adult: Secondary | ICD-10-CM

## 2019-02-15 DIAGNOSIS — E1169 Type 2 diabetes mellitus with other specified complication: Secondary | ICD-10-CM

## 2019-02-15 DIAGNOSIS — E785 Hyperlipidemia, unspecified: Secondary | ICD-10-CM

## 2019-02-15 DIAGNOSIS — I5042 Chronic combined systolic (congestive) and diastolic (congestive) heart failure: Secondary | ICD-10-CM | POA: Diagnosis not present

## 2019-02-15 DIAGNOSIS — I10 Essential (primary) hypertension: Secondary | ICD-10-CM | POA: Diagnosis not present

## 2019-02-15 DIAGNOSIS — Z95810 Presence of automatic (implantable) cardiac defibrillator: Secondary | ICD-10-CM

## 2019-02-15 DIAGNOSIS — I472 Ventricular tachycardia: Secondary | ICD-10-CM

## 2019-02-15 DIAGNOSIS — N529 Male erectile dysfunction, unspecified: Secondary | ICD-10-CM

## 2019-02-15 DIAGNOSIS — I493 Ventricular premature depolarization: Secondary | ICD-10-CM

## 2019-02-15 DIAGNOSIS — I2510 Atherosclerotic heart disease of native coronary artery without angina pectoris: Secondary | ICD-10-CM

## 2019-02-15 DIAGNOSIS — R5383 Other fatigue: Secondary | ICD-10-CM

## 2019-02-15 MED ORDER — POTASSIUM CHLORIDE CRYS ER 20 MEQ PO TBCR: 20.0000 meq | EXTENDED_RELEASE_TABLET | Freq: Every day | ORAL | 2 refills | Status: DC

## 2019-02-15 MED ORDER — CARVEDILOL 25 MG PO TABS: ORAL_TABLET | ORAL | 3 refills | Status: DC

## 2019-02-15 MED ORDER — ENTRESTO 49-51 MG PO TABS: 1.0000 | ORAL_TABLET | Freq: Two times a day (BID) | ORAL | 6 refills | Status: DC

## 2019-02-15 NOTE — Patient Instructions (Signed)
Medication Instructions:  INCREASE ENTRESTO TO 49/51 MG TWICE DAILY  *If you need a refill on your cardiac medications before your next appointment, please call your pharmacy*  Lab Work: If you have labs (blood work) drawn today and your tests are completely normal, you will receive your results only by: Marland Kitchen MyChart Message (if you have MyChart) OR . A paper copy in the mail If you have any lab test that is abnormal or we need to change your treatment, we will call you to review the results.  Testing/Procedures: Your physician has requested that you have an echocardiogram. Echocardiography is a painless test that uses sound waves to create images of your heart. It provides your doctor with information about the size and shape of your heart and how well your heart's chambers and valves are working. This procedure takes approximately one hour. There are no restrictions for this procedure.Erma    Follow-Up: At [facility], you and your health needs are our priority.  As part of our continuing mission to provide you with exceptional heart care, we have created designated Provider Care Teams.  These Care Teams include your primary Cardiologist (physician) and Advanced Practice Providers (APPs -  Physician Assistants and Nurse Practitioners) who all work together to provide you with the care you need, when you need it.  Your next appointment:   3 months  The format for your next appointment:   In Person  Provider:   Glenetta Hew, MD

## 2019-02-15 NOTE — Progress Notes (Signed)
Primary Care Provider: System, Provider Not In Cardiologist: Bryan Lemma, MD Electrophysiologist: Lewayne Bunting, MD  Clinic Note: Chief Complaint  Patient presents with  . Follow-up    Delayed annual  . Coronary Artery Disease  . Cardiomyopathy    Ischemic  . Fatigue    With some exertional dyspnea    HPI:    Brett Drake is a 48 y.o. male with a PMH of longstanding CAD and ischemic cardiomyopathy below who presents today for delayed annual follow-up.  Long history of CAD  Acute inferior STEMI April 2010 ([place]):PCI to the occluded RCA   Ischemic cardiomyopathy (EF 35%)/inducible VT: ICD placed in 2013   10/2015: Abnormal thoracic impedance with his ICD, -> Myoview Nuc ST => HIGH RISK w/ very large severe defect in inferior inferolateral and inferior wall. EF was estimated 31%. Inferior akinesis.  8/20017 Cardiac catheterization: Severe CAD with CTO of RCA & OM1 with severe LAD, Cx  & RI disease -> CABG.  CABGx2: LIMA-LAD, SVG-RI); Cx & rPDA not amenable for bypass. Extensive inferior scar.  Echo post CABG - EF ~40%. Inferior Akinesis. Gr1 D. Mild RV dilation with decreased function (only modest increase in EF)  Most recent Echocardiogram November 2018: EF roughly 40%.  Inferior akinesis.  Inferoseptal hypokinesis.  GRII DD.  Technically difficult study  Also has PMH: Severe OSA on CPAP, DM-2 on insulin and Metformin, hypertension hyperlipidemia, morbid obesity (with recent weight loss)  Brett Drake was last seen in Sept 2019 -- Echo planned for after 6 month f/u on more optimal medications.  Unfortunate this was not done due to Covid.  Recent Hospitalizations: n/a  Reviewed  CV studies:    The following studies were reviewed today: (if available, images/films reviewed: From Epic Chart or Care Everywhere) . none   Interval History:   Brett Drake returns here today for delayed annual follow-up doing.  What he notes is that he is pretty  much tired all the time he does not have a lot of energy level near the end of the day.  He is pretty much worn out. He says that he is pretty much active all day at work - walking all day & lifting.  May get winded unloading a full pallet, but not routing activity.  No chest pain or pressure, but may get short of breath with overexertion.  He says that he has been focusing a lot on his diet really changing what he eats minimizing carbs and fatty foods.  He basely cut out his fast foods.  He is lost about 20pounds since his last visit.  ++ uses & loves CPAP-cannot live without it..  CV Review of Symptoms (Summary) positive for - dyspnea on exertion, palpitations, rapid heart rate and This is with vigorous exertion or low prolonged exertion not with routine activity.  Also notes end of day fatigue. negative for - chest pain, edema, orthopnea, paroxysmal nocturnal dyspnea, shortness of breath or Syncope/near syncope, TIA/amaurosis fugax, claudication.   He has not heard of any abnormal findings on ICD evaluation, however he has noted some irregular heartbeat skipping beats not prolonged just lasting maybe seconds at a time or so but sometimes can last as many as a couple minutes.  The patient does not have symptoms concerning for COVID-19 infection (fever, chills, cough, or new shortness of breath).  The patient is practicing social distancing.  Compliant with masking.  Routinely goes out for groceries/shopping, but is very careful to be safe with hand sanitizer mask.  Avoiding large groups.Marland Kitchen  His workplace has a plan for safe work with distancing and masking when necessary.  Feels safe at work.   REVIEWED OF SYSTEMS   A comprehensive ROS was performed. Review of Systems  Constitutional: Negative for malaise/fatigue (Not with routine activity, but with prolonged exertion).  HENT: Negative for congestion and nosebleeds.   Respiratory: Negative for cough, hemoptysis, shortness of breath (Only if  he does prolonged or significant exertion.) and wheezing.        100% compliant with CPAP-the only thing that bothers him is congestion  Gastrointestinal: Negative for blood in stool, heartburn, melena and nausea.  Genitourinary: Negative for hematuria.  Musculoskeletal: Positive for back pain. Negative for falls and joint pain.  Neurological: Positive for tingling (He has tingling in his left shoulder and arm on occasion.  Usually he notes this also in wrists and hands when he is driving or after sitting in an arm chair for long period time.). Negative for focal weakness and weakness.  Endo/Heme/Allergies: Negative for environmental allergies.  Psychiatric/Behavioral: Negative for depression and memory loss. The patient is not nervous/anxious and does not have insomnia.   All other systems reviewed and are negative.  I have reviewed and (if needed) personally updated the patient's problem list, medications, allergies, past medical and surgical history, social and family history.   PAST MEDICAL HISTORY   Past Medical History:  Diagnosis Date  . AICD (automatic cardioverter/defibrillator) present 2013   Placed for EF of 35% with inducible VT in 2013 (in [place])  . Anxiety   . Chronic combined systolic and diastolic CHF, NYHA class 2 and ACA/AHA stage C 2010  . Coronary artery disease involving native coronary artery of native heart    ? PRE 2017 (h/o PCI to RCA 100%); 11/2015 - Cath with MV CAD: 100% pRCA, 80% pLAD, 80-90% oCx & oRI, subTO Om1. --> CABG  . Depression   . Diabetes mellitus without complication ([place])    type ll,uncontrolled with renal complications  . GERD (gastroesophageal reflux disease)   . Headache   . History of pneumonia 2016  . Hx of CABGx2    LIMA-LAD, SVG-RI - Native Cx & rPDA not amenable to PCI (extensive inferior scar)  . Hyperlipemia   . Hypertension    essential  . Ischemic cardiomyopathy    EF ~35%. Previously diagnosed, but referred for CABG 11/2015.  --EF Nov 2018 up to ~40%. - s/p ICD  . Morbid obesity ([place])   . Sleep apnea    Epworth Score 13  . ST elevation myocardial infarction (STEMI) of inferior wall ([place]) 07/07/2008   [facility]) - 100% RCA - PCI.  Marland Kitchen Urinary frequency    "due to medication"  . Wears glasses      PAST SURGICAL HISTORY   Past Surgical History:  Procedure Laterality Date  . CARDIAC CATHETERIZATION N/A 11/28/2015   Procedure: Left Heart Cath and Coronary Angiography;  Surgeon: [name], MD;  Location: [facility] INVASIVE CV LAB: EF 35% with global hypokinesis/inferior akinesis. pRCA 100% CTO (extensive L-R collaterals). LM - mild Dz. LAD: prox 70-80% (b4 D1) - FFR 0.77. m-dLAD mild diffuse CAD, Small D1 ost 90% diffuse disease. ostRI 90% (small). ostCx - 80-90% (mod diffuse D),  Major OM1 subTO prox.   Marland Kitchen CARDIAC CATHETERIZATION N/A 11/28/2015   Procedure: Intravascular Pressure Wire/FFR Study;  Surgeon: [name], MD;  Location: [facility] CV LAB: pLAD 70-80%, FFR 0.77  . COLONOSCOPY    . CORONARY ARTERY BYPASS GRAFT  Bilateral 12/19/2015   Procedure: CORONARY ARTERY BYPASS GRAFTING TIMES 2 (CABG X 2 LIMA-LAD, SVG-RI - Native Cx & RCA not amenable to bypass) - using left internal mammary artery and endoscopic left saphenous vein harvest;  Surgeon: Alleen Borne, MD;  Location: [facility] OR;  Service: Open Heart Surgery;  Laterality: Bilateral;  . CORONARY STENT INTERVENTION  2010   in [place] - 100% RCA - PCI  . EYE SURGERY Left    age 76,to correct lazy eye  . FINGER SURGERY Left    middle finger  . MASTECTOMY Bilateral 1985   gynecomastia  . PACEMAKER INSERTION  2013   St Jude,implantable defibrillator  . stents  07/08/2008   3   . TEE WITHOUT CARDIOVERSION N/A 12/19/2015   Procedure: TRANSESOPHAGEAL ECHOCARDIOGRAM (TEE);  Surgeon: Alleen Borne, MD;  Location: [facility] OR;  Service: Open Heart Surgery: Pre-op EF ~35% inferior AK & global HK. -> EF improved post-op (EF not reported).  . TRANSTHORACIC ECHOCARDIOGRAM  02/2017    EF 40%.  For septal axis with inferior akinesis.  GR 1 DD.  Poor acoustic windows.  Mild RV dilation with slightly decreased function. -->  Slight improvement when compared to previous echo --> diastolic function now Gr1 (from 2) & EF up to 40% from 30-35%    Cardiac CATH 11/28/2015: (Dr. Jacinto Halim) EF 35% with global HK/ Inf AK. pRCA 100% CTO (extensive L-R collaterals). LM - mild Dz. LAD: prox 70-80% (b4 D1) - FFR 0.77. m-dLAD mild diffuse CAD, Small D1 ost 90%. ostRI 90% (small). ostCx - 80-90% (relative a small caliber, mod diffuse Dz),  Major OM1 subTO prox.   CABG x2 12/19/2015: (Dr. Laneta Simmers - LIMA-LAD, SVG-RI); LCx & rPDA not amenable for bypass. Extensive inferior scar     .  MEDICATIONS/ALLERGIES   Current Meds  Medication Sig  . aspirin 325 MG tablet Take 81 mg by mouth daily.   Marland Kitchen buPROPion (WELLBUTRIN) 75 MG tablet Take 75 mg by mouth daily.  . carvedilol (COREG) 25 MG tablet TAKE ONE TABLET BY MOUTH TWICE A DAY WITH A MEAL  . clopidogrel (PLAVIX) 75 MG tablet TAKE ONE TABLET BY MOUTH DAILY  . esomeprazole (NEXIUM) 20 MG capsule Take 20 mg by mouth daily at 12 noon.  Marland Kitchen FLUoxetine HCl 60 MG TABS Take 60 mg by mouth daily.  . furosemide (LASIX) 40 MG tablet Take 40 mg by mouth daily.   Marland Kitchen HUMALOG KWIKPEN 200 UNIT/ML SOPN Inject 34 Units into the skin 3 (three) times daily before meals. Take 25 units with breakfast & lunch Take 30 units with dinner  . Insulin Glargine (BASAGLAR KWIKPEN) 100 UNIT/ML SOPN Inject 44 Units into the skin 2 (two) times daily.   . metFORMIN (GLUCOPHAGE) 500 MG tablet Take 1-2 tablets (500-1,000 mg total) by mouth 2 (two) times daily with a meal. Take 500 mg every morning  Take 1000 mg at bedtime  . oxymetazoline (AFRIN) 0.05 % nasal spray Place 1 spray into both nostrils 2 (two) times daily.   . potassium chloride SA (KLOR-CON) 20 MEQ tablet Take 1 tablet (20 mEq total) by mouth daily.  . rosuvastatin (CRESTOR) 40 MG tablet Take 40 mg by mouth daily.  .  sildenafil (REVATIO) 20 MG tablet TAKE ONE TO THREE TABLETS BY MOUTH AS NEEDED  . testosterone cypionate (DEPOTESTOSTERONE CYPIONATE) 200 MG/ML injection   . [DISCONTINUED] carvedilol (COREG) 25 MG tablet TAKE ONE TABLET BY MOUTH TWICE A DAY WITH A MEAL  . [DISCONTINUED] potassium chloride  SA (K-DUR,KLOR-CON) 20 MEQ tablet Take 1 tablet (20 mEq total) by mouth daily.  . [DISCONTINUED] sacubitril-valsartan (ENTRESTO) 24-26 MG Take 1 tablet by mouth 2 (two) times daily.  He is actually also taking Jardiance  Allergies  Allergen Reactions  . Sulfa Antibiotics Swelling and Rash    SWELLING REACTION UNSPECIFIED   . Penicillins Rash     SOCIAL HISTORY/FAMILY HISTORY   Social History   Tobacco Use  . Smoking status: Former Smoker    Quit date: 07/07/2008    Years since quitting: 10.6  . Smokeless tobacco: Never Used  . Tobacco comment: quit in 2010  Substance Use Topics  . Alcohol use: Yes    Alcohol/week: 0.0 standard drinks    Comment: rarely  . Drug use: No   Social History   Social History Narrative  . Not on file    Family History family history includes Cancer in his maternal grandmother; Dementia in his father; Heart attack (age of onset: 1642) in his father; Heart attack (age of onset: 6376) in his mother; Heart disease in his paternal grandfather and paternal grandmother; Heart disease (age of onset: 2360) in his maternal grandfather; Heart failure in his father; Kidney failure in his father.   OBJCTIVE -PE, EKG, labs   Wt Readings from Last 3 Encounters:  02/15/19 299 lb 12.8 oz (136 kg)  12/23/17 (!) 313 lb 9.6 oz (142.2 kg)  09/19/17 (!) 310 lb (140.6 kg)    Physical Exam: BP 134/78   Pulse 89   Ht 5\' 11"  (1.803 m)   Wt 299 lb 12.8 oz (136 kg)   BMI 41.81 kg/m  Physical Exam  Constitutional: He is oriented to person, place, and time. He appears well-nourished.  HENT:  Head: Normocephalic and atraumatic.  Eyes:  R>L eye "lazy" - poor lateral tracking  Neck:  Normal range of motion. Neck supple. Hepatojugular reflux (minimal) and JVD (7-8 cm H20 - stable) present. Carotid bruit is not present.  Cardiovascular: Normal rate, regular rhythm, S1 normal and S2 normal. Frequent extrasystoles are present. PMI is not displaced (Unable to palpate). Exam reveals gallop, S4, distant heart sounds and decreased pulses (Mildly diminished pedal pulses but palpable.).  No murmur heard. Pulmonary/Chest: Effort normal. No respiratory distress. He has no wheezes. He has no rales.  Distant breath sounds - but clear  Abdominal: Soft. Bowel sounds are normal. He exhibits no distension. There is no abdominal tenderness. There is no rebound.  Truncal obesity - no HSM  Musculoskeletal: Normal range of motion.        General: Edema (Trivial) present.  Neurological: He is alert and oriented to person, place, and time.  Skin: Skin is warm and dry. No erythema.   bilateral varicose veins and spider nevi with venous stasis changes.   Psychiatric: He has a normal mood and affect. His behavior is normal. Judgment and thought content normal.     Adult ECG Report  Rate: 87;  Rhythm: normal sinus rhythm, premature ventricular contractions (PVC) and Borderline bigeminy pattern.  Anterior MI, age undetermined.;  Rightward axis (99 degrees)  Narrative Interpretation: Axis is shifted somewhat rightward.  PVCs more prominent.  Recent Labs:   Lab Results  Component Value Date   CHOL 123 01/03/2019   HDL 29 (L) 01/03/2019   LDLCALC 70 01/03/2019   TRIG 137 01/03/2019   CHOLHDL 4.2 01/03/2019   Lab Results  Component Value Date   CREATININE 1.09 01/03/2019   BUN 17 01/03/2019   NA  139 01/03/2019   K 5.2 01/03/2019   CL 99 01/03/2019   CO2 25 01/03/2019    ASSESSMENT/PLAN    Problem List Items Addressed This Visit    Ischemic cardiomyopathy - Primary (Chronic)    Longstanding finding however his EF did improve after CABG.  He has an ICD in place now.  He is currently on  carvedilol 25 mg twice daily along with Entresto at lowest dose.  He is on standing Lasix and has not required additional dosing based on sliding scale. He is not on spironolactone which should be another option.  He reportedly is on Jardiance, but also listed.  I will be concerned with increased PVCs and his fatigue that there could be some worsening function.  We will recheck an echocardiogram and reassess after titrating up his Entresto.      Relevant Medications   sacubitril-valsartan (ENTRESTO) 49-51 MG   carvedilol (COREG) 25 MG tablet   Other Relevant Orders   EKG 12/Charge capture (Completed)   ECHOCARDIOGRAM COMPLETE   Chronic combined systolic and diastolic heart failure, NYHA class 2 (HCC) (Chronic)    Initial post CABG echo showed EF improved.  Dense inferior scar so that area will likely not come back.  I would not expect EF to get better than 40%.  However now with fatigue and exertional dyspnea will need to exclude worsening EF.  Plan: Check 2D echo. Otherwise he is on an excellent regimen with exception of potentially adding spironolactone in the future).  Titrate Entresto to 49/51 mg  Continue current dose of Lasix with sliding scale.  He has not had take additional doses.  Thoracic impedance followed by ICD interrogation  I am a little bit worried with the increased PVCs that this could be a sign of worsening function.  Could also be the PVCs himself leading to worsening function.  Low threshold to consider amiodarone.  In that case I would reduce carvedilol, increase Entresto and potentially add spironolactone.      Relevant Medications   sacubitril-valsartan (ENTRESTO) 49-51 MG   carvedilol (COREG) 25 MG tablet   Presence of single chamber automatic cardioverter/defibrillator (AICD) (Chronic)    ICD in place.  Has not had any abnormal finding since placement for nonsustained VT.  Now with increased PVCs would like to see PVC burden on next check.  Would like this  really inpatient. Hopefully following the thoracic impedance, but have not seen any recommendation for titration of diuretic.      Essential hypertension (Chronic)    Blood pressures pretty well controlled but he does have blood pressure in increase Entresto and potentially carvedilol if necessary. Plan: Titrate the next dose of Entresto today.  Once we get him on stable doses of Entresto and carvedilol, would consider spironolactone      Relevant Medications   sacubitril-valsartan (ENTRESTO) 49-51 MG   carvedilol (COREG) 25 MG tablet   Hyperlipidemia associated with type 2 diabetes mellitus (HCC) target LDL<50, goal<70 (Chronic)    He just had lipids checked in September.  LDL is at 70.  Would like it to be less than 55.  This was a slight trend up.  Monitor closely with low threshold to consider addition of Zetia.      Morbid obesity with body mass index (BMI) of 40.0 to 44.9 in adult [facility]) (Chronic)    Pretty significant weight loss of about 14 pounds in the last year.  Not at this much as we would like it to be but  is definitely a step in the right direction. Continue to recommend full activity.  The difficulty here is that he has fatigue at the end of the day      NSVT (nonsustained ventricular tachycardia) (HCC) (Chronic)    This was a telephone no further episodes.  ICD did not show anything.  With him having some palpitations a little bit concerned based on the PVCs I am seeing.  Would like to have his next ICD interrogation actually be in person in order to determine PVC burden.  He is on high-dose carvedilol, and for the most part asymptomatic.  We do have room to titrate further.      Relevant Medications   sacubitril-valsartan (ENTRESTO) 49-51 MG   carvedilol (COREG) 25 MG tablet   Coronary artery disease involving native coronary artery of native heart without angina pectoris (Chronic)    Severe native CAD with essentially RCA and circumflex occlusion.  The main branch  of the circumflex was a major OM.  Both these vessels were not amenable to PCI.  He did have a graft to the ramus intermedius which perfuses part of the inferolateral wall however there is a dense inferior inferolateral scar on echo and Myoview.  Likely no benefit from attempted revascularization in that territory. His CABG was in September 2017, will be due for stress test relatively soon as of next year however if EF is down or if his symptoms of fatigue worsen, would consider earlier testing.  He is on excellent regimen currently taking aspirin and Plavix along with statin, carvedilol and Entresto.      Relevant Medications   sacubitril-valsartan (ENTRESTO) 49-51 MG   carvedilol (COREG) 25 MG tablet   Other Relevant Orders   EKG 12/Charge capture (Completed)   Erectile dysfunction of organic origin (Chronic)    Has as needed sildenafil.      Frequent PVCs    Almost bigeminy pattern PVCs on EKG.  Little worrisome, but likely scar related.  Need to determine PVC burden as increased amount of PVCs could potentially worsen his EF and make him more fatigue.  Next AICD interrogation need to interrogate in person to determine PVC burden. Continue carvedilol, but low threshold to consider amiodarone      Relevant Medications   sacubitril-valsartan (ENTRESTO) 49-51 MG   carvedilol (COREG) 25 MG tablet   Fatigue    Really hard to tell what this is related to.  I think is probably just that he overdoes it during the day and with his reduced cardiac output he gets tired easily.  However it does seem to be a relatively new thing for him to go there for reassess with an echocardiogram.  If there are changes in EF, would probably need to move up the timing for ischemic evaluation.        COVID-19 Education: The signs and symptoms of COVID-19 were discussed with the patient and how to seek care for testing (follow up with PCP or arrange E-visit).   The importance of social distancing was  discussed today.  I spent a total of with the patient and chart review. >  50% of the time was spent in direct patient consultation.  Additional time spent with chart review (studies, outside notes, etc): 8 Total Time: 28 min   Current medicines are reviewed at length with the patient today.  (+/- concerns) n/a   Patient Instructions / Medication Changes & Studies & Tests Ordered   Patient Instructions  Medication Instructions:  INCREASE ENTRESTO TO 49/51 MG TWICE DAILY  *If you need a refill on your cardiac medications before your next appointment, please call your pharmacy*  Lab Work: If you have labs (blood work) drawn today and your tests are completely normal, you will receive your results only by: Marland Kitchen MyChart Message (if you have MyChart) OR . A paper copy in the mail If you have any lab test that is abnormal or we need to change your treatment, we will call you to review the results.  Testing/Procedures: Your physician has requested that you have an echocardiogram. Echocardiography is a painless test that uses sound waves to create images of your heart. It provides your doctor with information about the size and shape of your heart and how well your heart's chambers and valves are working. This procedure takes approximately one hour. There are no restrictions for this procedure.[address]    Follow-Up: At [facility], you and your health needs are our priority.  As part of our continuing mission to provide you with exceptional heart care, we have created designated Provider Care Teams.  These Care Teams include your primary Cardiologist (physician) and Advanced Practice Providers (APPs -  Physician Assistants and Nurse Practitioners) who all work together to provide you with the care you need, when you need it.  Your next appointment:   3 months  The format for your next appointment:   In Person  Provider:   Bryan Lemma, MD      Studies  Ordered:   Orders Placed This Encounter  Procedures  . EKG 12/Charge capture  . ECHOCARDIOGRAM COMPLETE     Bryan Lemma, M.D., M.S. Interventional Cardiologist   Pager # [phone] Phone # [phone] [address] [place], [place] [address]   Thank you for choosing Heartcare at [facility]!!

## 2019-02-16 ENCOUNTER — Telehealth: Payer: Self-pay | Admitting: *Deleted

## 2019-02-16 NOTE — Telephone Encounter (Signed)
Left message for patient to call and schedule Echo ordered by Dr. Steva Colder also needs 3 month follow up with Dr. Ellyn Hack

## 2019-02-17 ENCOUNTER — Encounter: Payer: Self-pay | Admitting: Cardiology

## 2019-02-17 NOTE — Assessment & Plan Note (Signed)
ICD in place.  Has not had any abnormal finding since placement for nonsustained VT.  Now with increased PVCs would like to see PVC burden on next check.  Would like this really inpatient. Hopefully following the thoracic impedance, but have not seen any recommendation for titration of diuretic.

## 2019-02-17 NOTE — Assessment & Plan Note (Signed)
He just had lipids checked in September.  LDL is at 70.  Would like it to be less than 55.  This was a slight trend up.  Monitor closely with low threshold to consider addition of Zetia.

## 2019-02-17 NOTE — Assessment & Plan Note (Signed)
Really hard to tell what this is related to.  I think is probably just that he overdoes it during the day and with his reduced cardiac output he gets tired easily.  However it does seem to be a relatively new thing for him to go there for reassess with an echocardiogram.  If there are changes in EF, would probably need to move up the timing for ischemic evaluation.

## 2019-02-17 NOTE — Assessment & Plan Note (Signed)
Has as needed sildenafil.

## 2019-02-17 NOTE — Assessment & Plan Note (Signed)
Initial post CABG echo showed EF improved.  Dense inferior scar so that area will likely not come back.  I would not expect EF to get better than 40%.  However now with fatigue and exertional dyspnea will need to exclude worsening EF.  Plan: Check 2D echo. Otherwise he is on an excellent regimen with exception of potentially adding spironolactone in the future).  Titrate Entresto to 49/51 mg  Continue current dose of Lasix with sliding scale.  He has not had take additional doses.  Thoracic impedance followed by ICD interrogation  I am a little bit worried with the increased PVCs that this could be a sign of worsening function.  Could also be the PVCs himself leading to worsening function.  Low threshold to consider amiodarone.  In that case I would reduce carvedilol, increase Entresto and potentially add spironolactone.

## 2019-02-17 NOTE — Assessment & Plan Note (Signed)
Pretty significant weight loss of about 14 pounds in the last year.  Not at this much as we would like it to be but is definitely a step in the right direction. Continue to recommend full activity.  The difficulty here is that he has fatigue at the end of the day

## 2019-02-17 NOTE — Assessment & Plan Note (Addendum)
Longstanding finding however his EF did improve after CABG.  He has an ICD in place now.  He is currently on carvedilol 25 mg twice daily along with Entresto at lowest dose.  He is on standing Lasix and has not required additional dosing based on sliding scale. He is not on spironolactone which should be another option.  He reportedly is on Jardiance, but also listed.  I will be concerned with increased PVCs and his fatigue that there could be some worsening function.  We will recheck an echocardiogram and reassess after titrating up his Entresto.

## 2019-02-17 NOTE — Assessment & Plan Note (Signed)
Almost bigeminy pattern PVCs on EKG.  Little worrisome, but likely scar related.  Need to determine PVC burden as increased amount of PVCs could potentially worsen his EF and make him more fatigue.  Next AICD interrogation need to interrogate in person to determine PVC burden. Continue carvedilol, but low threshold to consider amiodarone

## 2019-02-17 NOTE — Assessment & Plan Note (Signed)
This was a telephone no further episodes.  ICD did not show anything.  With him having some palpitations a little bit concerned based on the PVCs I am seeing.  Would like to have his next ICD interrogation actually be in person in order to determine PVC burden.  He is on high-dose carvedilol, and for the most part asymptomatic.  We do have room to titrate further.

## 2019-02-17 NOTE — Assessment & Plan Note (Signed)
Severe native CAD with essentially RCA and circumflex occlusion.  The main branch of the circumflex was a major OM.  Both these vessels were not amenable to PCI.  He did have a graft to the ramus intermedius which perfuses part of the inferolateral wall however there is a dense inferior inferolateral scar on echo and Myoview.  Likely no benefit from attempted revascularization in that territory. His CABG was in September 2017, will be due for stress test relatively soon as of next year however if EF is down or if his symptoms of fatigue worsen, would consider earlier testing.  He is on excellent regimen currently taking aspirin and Plavix along with statin, carvedilol and Entresto.

## 2019-02-17 NOTE — Assessment & Plan Note (Signed)
Blood pressures pretty well controlled but he does have blood pressure in increase Entresto and potentially carvedilol if necessary. Plan: Titrate the next dose of Entresto today.  Once we get him on stable doses of Entresto and carvedilol, would consider spironolactone

## 2019-02-26 LAB — CUP PACEART REMOTE DEVICE CHECK
Battery Remaining Longevity: 27 mo
Battery Remaining Percentage: 27 %
Battery Voltage: 2.83 V
Brady Statistic AP VP Percent: 1 %
Brady Statistic AP VS Percent: 9.8 %
Brady Statistic AS VP Percent: 1 %
Brady Statistic AS VS Percent: 87 %
Brady Statistic RA Percent Paced: 6.8 %
Brady Statistic RV Percent Paced: 1 %
Date Time Interrogation Session: 20201123020017
HighPow Impedance: 97 Ohm
HighPow Impedance: 97 Ohm
Implantable Lead Implant Date: 20130410
Implantable Lead Implant Date: 20130410
Implantable Lead Location: 753859
Implantable Lead Location: 753860
Implantable Pulse Generator Implant Date: 20130410
Lead Channel Impedance Value: 440 Ohm
Lead Channel Impedance Value: 600 Ohm
Lead Channel Pacing Threshold Amplitude: 1 V
Lead Channel Pacing Threshold Amplitude: 1.25 V
Lead Channel Pacing Threshold Pulse Width: 0.5 ms
Lead Channel Pacing Threshold Pulse Width: 0.5 ms
Lead Channel Sensing Intrinsic Amplitude: 1.2 mV
Lead Channel Sensing Intrinsic Amplitude: 12 mV
Lead Channel Setting Pacing Amplitude: 2 V
Lead Channel Setting Pacing Amplitude: 2.5 V
Lead Channel Setting Pacing Pulse Width: 0.5 ms
Lead Channel Setting Sensing Sensitivity: 0.5 mV
Pulse Gen Serial Number: 1041794

## 2019-02-28 ENCOUNTER — Ambulatory Visit (HOSPITAL_COMMUNITY): Payer: 59 | Attending: Cardiovascular Disease

## 2019-02-28 ENCOUNTER — Ambulatory Visit (INDEPENDENT_AMBULATORY_CARE_PROVIDER_SITE_OTHER): Payer: 59 | Admitting: *Deleted

## 2019-02-28 ENCOUNTER — Other Ambulatory Visit: Payer: Self-pay

## 2019-02-28 DIAGNOSIS — I255 Ischemic cardiomyopathy: Secondary | ICD-10-CM | POA: Insufficient documentation

## 2019-02-28 DIAGNOSIS — I5042 Chronic combined systolic (congestive) and diastolic (congestive) heart failure: Secondary | ICD-10-CM

## 2019-02-28 MED ORDER — PERFLUTREN LIPID MICROSPHERE
1.0000 mL | INTRAVENOUS | Status: AC | PRN
  Administered 2019-02-28: 3 mL via INTRAVENOUS

## 2019-03-27 NOTE — Progress Notes (Signed)
ICD remote 

## 2019-05-13 ENCOUNTER — Other Ambulatory Visit: Payer: Self-pay | Admitting: Cardiology

## 2019-05-23 ENCOUNTER — Ambulatory Visit (INDEPENDENT_AMBULATORY_CARE_PROVIDER_SITE_OTHER): Payer: 59 | Admitting: Cardiology

## 2019-05-23 ENCOUNTER — Other Ambulatory Visit: Payer: Self-pay

## 2019-05-23 ENCOUNTER — Encounter: Payer: Self-pay | Admitting: Cardiology

## 2019-05-23 VITALS — BP 136/70 | HR 70 | Temp 97.6°F | Ht 71.0 in | Wt 299.0 lb

## 2019-05-23 DIAGNOSIS — E1169 Type 2 diabetes mellitus with other specified complication: Secondary | ICD-10-CM

## 2019-05-23 DIAGNOSIS — Z6841 Body Mass Index (BMI) 40.0 and over, adult: Secondary | ICD-10-CM

## 2019-05-23 DIAGNOSIS — I493 Ventricular premature depolarization: Secondary | ICD-10-CM

## 2019-05-23 DIAGNOSIS — E785 Hyperlipidemia, unspecified: Secondary | ICD-10-CM

## 2019-05-23 DIAGNOSIS — I5042 Chronic combined systolic (congestive) and diastolic (congestive) heart failure: Secondary | ICD-10-CM | POA: Diagnosis not present

## 2019-05-23 DIAGNOSIS — I255 Ischemic cardiomyopathy: Secondary | ICD-10-CM

## 2019-05-23 DIAGNOSIS — I10 Essential (primary) hypertension: Secondary | ICD-10-CM

## 2019-05-23 DIAGNOSIS — I2510 Atherosclerotic heart disease of native coronary artery without angina pectoris: Secondary | ICD-10-CM

## 2019-05-23 MED ORDER — FUROSEMIDE 40 MG PO TABS: 20.0000 mg | ORAL_TABLET | Freq: Every day | ORAL | 11 refills | Status: AC

## 2019-05-23 MED ORDER — ENTRESTO 97-103 MG PO TABS: 1.0000 | ORAL_TABLET | Freq: Two times a day (BID) | ORAL | 11 refills | Status: AC

## 2019-05-23 NOTE — Progress Notes (Signed)
Primary Care Provider: Tracey Harries, MD Cardiologist: Bryan Lemma, MD Electrophysiologist: Lewayne Bunting, MD  Clinic Note: Chief Complaint  Patient presents with  . Follow-up    Echo result, medication titration.  . Coronary Artery Disease    No angina  . Cardiomyopathy    Ischemic: No CHF symptoms    HPI:    Brett Drake is a 49 y.o. male with a PMH of longstanding CAD and ischemic cardiomyopathy below who presents today for 11-month follow-up to discuss results of echo and titration of medications.  Long history of CAD  Acute inferior STEMI April 2010 ([place]):PCI to the occluded RCA   Ischemic cardiomyopathy (EF 35%)/inducible VT: ICD placed in 2013   10/2015: Abnormal thoracic impedance with his ICD, -> Myoview Nuc ST => HIGH RISK w/ very large severe defect in inferior inferolateral and inferior wall. EF was estimated 31%. Inferior akinesis.  8/20017 Cardiac catheterization: Severe CAD with CTO of RCA & OM1 with severe LAD, Cx  & RI disease -> CABG.  CABGx2: LIMA-LAD, SVG-RI); Cx & rPDA not amenable for bypass. Extensive inferior scar.  TTE post CABG - EF ~40%. Inferior Akinesis. Gr1 D. Mild RV dilation with decreased function (only modest increase in EF)  Nov 2018 TTE : EF roughly 40%.  Inferior akinesis.  Inferoseptal hypokinesis.  GRII DD.  Technically difficult study  02/28/2019 - TTE: EF is 40%.  Moderate reduced EF with LVH.  Basal -mid inferior wall & inferolateral wall severely hypokinetic/akinetic.  Severely dilated LA and normal RA.  Reduced RV function with normal PA pressures.  Essentially normal valves.  Also has PMH: Severe OSA on CPAP, DM-2 on insulin and Metformin, hypertension hyperlipidemia, morbid obesity (with recent weight loss)  Brett Drake was last seen on February 14, 2019.  It was noted that his EF did not improve after CABG had ICD in place. --> We increased his Entresto to 49-51 mg, continue current dose of Lasix with  sliding scale along with carvedilol.  Next was can be to add spironolactone.  It was noted that he was supposed to be on Jardiance but was not on his list.  TTE ordered  Recent Hospitalizations: n/a  Reviewed  CV studies:    The following studies were reviewed today: (if available, images/films reviewed: From Epic Chart or Care Everywhere) . 02/28/2019 - TTE: EF is 40%.  Moderate reduced EF with LVH.  Basal and mid inferior wall with basal to mid inferolateral wall.  Severely dilated LA and normal RA.  Reduced RV function with normal PA pressures.  Essentially normal valves.   Interval History:   Brett Drake returns here today stating that he is doing fairly well overall from a cardiac standpoint.  Mostly activity limited by arthritis pains and fatigue and cold weather.  He still notes that he is usually tired and worn out at the end of the day.  Indicating that he runs out of energy.  It is not a matter of running out of the energy doing certain activities, just the accumulation of the day.  He is however still very active at work and is able to do plenty of lifting walking and OTHER exertion.  He does continue to mention that he will get short of breath with overexertion, no chest pain or pressure.  Is continue to maintain a healthy diet, but has not been able to lose any more weight.  He does state he is actually problem self or not gaining weight over the "eating season  timeframe" from November through January.  He really does not have any true heart failure symptoms other than some exertional dyspnea.  No signs or symptoms of arrhythmias.  Still using CPAP - Loves it.   CV Review of Symptoms (Summary) positive for - dyspnea on exertion and in colder weather negative for - chest pain, edema, irregular heartbeat, orthopnea, palpitations, paroxysmal nocturnal dyspnea, rapid heart rate, shortness of breath or Syncope/near syncope, TIA/amaurosis fugax, claudication.   He still has  some irregular heartbeat sensations off and on but nothing like it had before.  The patient DOES NOT have symptoms concerning for COVID-19 infection (fever, chills, cough, or new shortness of breath).  The patient is practicing social distancing & masking.  He feels safe and comfortable at his workplace and is very careful when he goes out to groceries and shopping.    REVIEWED OF SYSTEMS   A comprehensive ROS was performed. Review of Systems  Constitutional: Negative for malaise/fatigue (May be a little deconditioning related fatigue; end of the day fatigue).  HENT: Negative for congestion and nosebleeds.   Respiratory: Negative for cough, hemoptysis, shortness of breath (Only if he does prolonged or significant exertion.) and wheezing.        100% compliant with CPAP-the only thing that bothers him is congestion  Gastrointestinal: Negative for blood in stool, heartburn, melena and nausea.  Genitourinary: Negative for hematuria.  Musculoskeletal: Positive for back pain. Negative for falls and joint pain.  Neurological: Positive for tingling (He still has some tingling in his left arm more than the right associated with prolonged driving, or sitting on chair.). Negative for focal weakness and weakness.  Endo/Heme/Allergies: Negative for environmental allergies.  Psychiatric/Behavioral: Negative for depression and memory loss. The patient is not nervous/anxious and does not have insomnia.    I have reviewed and (if needed) personally updated the patient's problem list, medications, allergies, past medical and surgical history, social and family history.   PAST MEDICAL HISTORY   Past Medical History:  Diagnosis Date  . AICD (automatic cardioverter/defibrillator) present 2013   Placed for EF of 35% with inducible VT in 2013 (in [place]  . Anxiety   . Chronic combined systolic and diastolic CHF, NYHA class 2 and ACA/AHA stage C 2010  . Coronary artery disease involving native coronary  artery of native heart    ? PRE 2017 (h/o PCI to RCA 100%); 11/2015 - Cath with MV CAD: 100% pRCA, 80% pLAD, 80-90% oCx & oRI, subTO Om1. --> CABG  . Depression   . 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 GERD (gastroesophageal reflux disease)   . Headache   . History of pneumonia 2016  . Hx of CABGx2    LIMA-LAD, SVG-RI - Native Cx & rPDA not amenable to PCI (extensive inferior scar)  . Hyperlipemia   . Hypertension    essential  . Ischemic cardiomyopathy    EF ~35%. Previously diagnosed, but referred for CABG 11/2015. --EF Nov 2018 up to ~40%. - s/p ICD  . Morbid obesity (HCC)   . Sleep apnea    Epworth Score 13  . ST elevation myocardial infarction (STEMI) of inferior wall (HCC) 07/07/2008   [facility]([place]) - 100% RCA - PCI.  Marland Kitchen. Urinary frequency    "due to medication"  . Wears glasses     PAST SURGICAL HISTORY   Past Surgical History:  Procedure Laterality Date  . CARDIAC CATHETERIZATION N/A 11/28/2015   Procedure: Left Heart  Cath and Coronary Angiography;  Surgeon: Yates DecampJay Ganji, MD;  [facility]: EF 35% with global hypokinesis/inferior akinesis. pRCA 100% CTO (extensive L-R collaterals). LM - mild Dz. LAD: prox 70-80% (b4 D1) - FFR 0.77. m-dLAD mild diffuse CAD, Small D1 ost 90% diffuse disease. ostRI 90% (small). ostCx - 80-90% (mod diffuse D),  Major OM1 subTO prox.   Marland Kitchen. CARDIAC CATHETERIZATION N/A 11/28/2015   Procedure: Intravascular Pressure Wire/FFR Study;  Surgeon: Yates DecampJay Ganji, MD;  [facility]: pLAD 70-80%, FFR 0.77  . COLONOSCOPY    . CORONARY ARTERY BYPASS GRAFT Bilateral 12/19/2015   Procedure: CORONARY ARTERY BYPASS GRAFTING TIMES 2 (CABG X 2 LIMA-LAD, SVG-RI - Native Cx & RCA not amenable to bypass) - using left internal mammary artery and endoscopic left saphenous vein harvest;  Surgeon: Alleen BorneBryan K Bartle, MD;  Location: [facility] OR;  Service: Open Heart Surgery;  Laterality: Bilateral;  . CORONARY  STENT INTERVENTION  2010   in [place] - 100% RCA - PCI  . EYE SURGERY Left    age 72,to correct lazy eye  . FINGER SURGERY Left    middle finger  . MASTECTOMY Bilateral 1985   gynecomastia  . PACEMAKER INSERTION  2013   St Jude,implantable defibrillator  . stents  07/08/2008   3   . TEE WITHOUT CARDIOVERSION N/A 12/19/2015   Procedure: TRANSESOPHAGEAL ECHOCARDIOGRAM (TEE);  Surgeon: Alleen BorneBryan K Bartle, MD;  [facility];  Service: Open Heart Surgery: Pre-op EF ~35% inferior AK & global HK. -> EF improved post-op (EF not reported).  . TRANSTHORACIC ECHOCARDIOGRAM  02/2017   EF 40%.  For septal axis with inferior akinesis.  GR 1 DD.  Poor acoustic windows.  Mild RV dilation with slightly decreased function. -->  Slight improvement when compared to previous echo --> diastolic function now Gr1 (from 2) & EF up to 40% from 30-35%  . TRANSTHORACIC ECHOCARDIOGRAM  02/2019   EF stable at 40%.  Moderate reduced EF with LVH.  Basal and mid inferior wall with basal to mid inferolateral wall.  Severely dilated LA and normal RA.  Reduced RV function with normal PA pressures.  Essentially normal valves.    Cardiac CATH 11/28/2015: (Dr. Jacinto HalimGanji) EF 35% with global HK/ Inf AK. pRCA 100% CTO (extensive L-R collaterals). LM - mild Dz. LAD: prox 70-80% (b4 D1) - FFR 0.77. m-dLAD mild diffuse CAD, Small D1 ost 90%. ostRI 90% (small). ostCx - 80-90% (relative a small caliber, mod diffuse Dz),  Major OM1 subTO prox.   CABG x2 12/19/2015: (Dr. Laneta SimmersBartle - LIMA-LAD, SVG-RI); LCx & rPDA not amenable for bypass. Extensive inferior scar     .  MEDICATIONS/ALLERGIES   Current Meds  Medication Sig  . buPROPion (WELLBUTRIN) 75 MG tablet Take 75 mg by mouth daily.  . carvedilol (COREG) 25 MG tablet TAKE ONE TABLET BY MOUTH TWICE A DAY WITH A MEAL  . clopidogrel (PLAVIX) 75 MG tablet TAKE ONE TABLET BY MOUTH DAILY  . esomeprazole (NEXIUM) 20 MG capsule Take 20 mg by mouth daily at 12 noon.  Marland Kitchen. FLUoxetine HCl 60 MG TABS Take  60 mg by mouth daily.  . furosemide (LASIX) 40 MG tablet Take 0.5 tablets (20 mg total) by mouth daily.  Marland Kitchen. HUMALOG KWIKPEN 200 UNIT/ML SOPN Inject 34 Units into the skin 3 (three) times daily before meals.   . Insulin Glargine (BASAGLAR KWIKPEN) 100 UNIT/ML SOPN Inject 44 Units into the skin 2 (two) times daily.   . metFORMIN (  GLUCOPHAGE) 500 MG tablet Take 1-2 tablets (500-1,000 mg total) by mouth 2 (two) times daily with a meal. Take 500 mg every morning  Take 1000 mg at bedtime  . oxymetazoline (AFRIN) 0.05 % nasal spray Place 1 spray into both nostrils 2 (two) times daily.   . potassium chloride SA (KLOR-CON) 20 MEQ tablet Take 1 tablet (20 mEq total) by mouth daily.  . rosuvastatin (CRESTOR) 40 MG tablet Take 40 mg by mouth daily.  . sildenafil (REVATIO) 20 MG tablet TAKE ONE TO THREE TABLETS BY MOUTH AS NEEDED  . testosterone cypionate (DEPOTESTOSTERONE CYPIONATE) 200 MG/ML injection   . [DISCONTINUED] aspirin 325 MG tablet Take 81 mg by mouth daily.   . [DISCONTINUED] furosemide (LASIX) 40 MG tablet Take 40 mg by mouth daily.   . [DISCONTINUED] sacubitril-valsartan (ENTRESTO) 49-51 MG Take 1 tablet by mouth 2 (two) times daily.    Allergies  Allergen Reactions  . Sulfa Antibiotics Swelling and Rash    SWELLING REACTION UNSPECIFIED   . Penicillins Rash     SOCIAL HISTORY/FAMILY HISTORY   Social History   Tobacco Use  . Smoking status: Former Smoker    Quit date: 07/07/2008    Years since quitting: 10.8  . Smokeless tobacco: Never Used  . Tobacco comment: quit in 2010  Substance Use Topics  . Alcohol use: Yes    Alcohol/week: 0.0 standard drinks    Comment: rarely  . Drug use: No   Social History   Social History Narrative  . Not on file    Family History family history includes Cancer in his maternal grandmother; Dementia in his father; Heart attack (age of onset: 67) in his father; Heart attack (age of onset: 33) in his mother; Heart disease in his paternal  grandfather and paternal grandmother; Heart disease (age of onset: 8) in his maternal grandfather; Heart failure in his father; Kidney failure in his father.   OBJCTIVE -PE, EKG, labs   Wt Readings from Last 3 Encounters:  05/23/19 299 lb (135.6 kg)  02/15/19 299 lb 12.8 oz (136 kg)  12/23/17 (!) 313 lb 9.6 oz (142.2 kg)    Physical Exam: BP 136/70 (BP Location: Left Arm, Patient Position: Sitting, Cuff Size: Normal)   Pulse 70   Temp 97.6 F (36.4 C)   Ht 5\' 11"  (1.803 m)   Wt 299 lb (135.6 kg)   BMI 41.70 kg/m  Physical Exam  Constitutional: He is oriented to person, place, and time. He appears well-nourished.  Still morbidly obese.  Well-groomed  HENT:  Head: Normocephalic and atraumatic.  Eyes:  R>L eye "lazy" - poor lateral tracking  Neck: Hepatojugular reflux (minimal) present. No JVD (6-7 cm H20 - stable) present. Carotid bruit is not present.  Cardiovascular: Normal rate, regular rhythm, S1 normal and S2 normal. Frequent extrasystoles are present. PMI is not displaced (Unable to palpate). Exam reveals gallop, S4, distant heart sounds and decreased pulses (Mildly diminished pedal pulses but palpable.).  No murmur heard. Pulmonary/Chest: Effort normal. No respiratory distress. He has no wheezes. He has no rales.  Distant breath sounds - but clear  Abdominal: Soft. Bowel sounds are normal. He exhibits no distension. There is no abdominal tenderness.  Truncal obesity - no HSM  Musculoskeletal:        General: Edema (Trivial) present. Normal range of motion.     Cervical back: Normal range of motion and neck supple.  Neurological: He is alert and oriented to person, place, and time.  Skin:  bilateral varicose veins and spider nevi with venous stasis changes.   Psychiatric: He has a normal mood and affect. His behavior is normal. Judgment and thought content normal.     Adult ECG Report Not checked  Recent Labs: No new labs since last visit. Lab Results  Component  Value Date   CHOL 123 01/03/2019   HDL 29 (L) 01/03/2019   LDLCALC 70 01/03/2019   TRIG 137 01/03/2019   CHOLHDL 4.2 01/03/2019   Lab Results  Component Value Date   CREATININE 1.09 01/03/2019   BUN 17 01/03/2019   NA 139 01/03/2019   K 5.2 01/03/2019   CL 99 01/03/2019   CO2 25 01/03/2019    ASSESSMENT/PLAN    Problem List Items Addressed This Visit    Ischemic cardiomyopathy - Primary (Chronic)    Longstanding condition, but had notable improvement in EF post CABG.  Status post ICD.  No active heart failure symptoms.   Plan: We will continue to titrate Entresto with hopes to potentially cut back his Lasix.  Continue current dose of carvedilol.  Is on Jardiance as well.  As he has not required much diuretic, he is not spironolactone.  His ICD is being followed-up by EP      Relevant Medications   sacubitril-valsartan (ENTRESTO) 97-103 MG   furosemide (LASIX) 40 MG tablet   Other Relevant Orders   Lipid panel   Comprehensive metabolic panel   Chronic combined systolic and diastolic heart failure, NYHA class 2 (HCC) (Chronic)    Continues to be euvolemic without any additional use of Lasix.  I think we can potentially back down on his Lasix. He has blood pressure room to titrate up Entresto.  Plan:  Refill current prescription of Entresto with increased dose of 97/103 mg  With increased dose of Entresto, we will reduce standing dose of furosemide to 20 mg daily with additional 20 to 40 mg daily as needed dyspnea, weight gain more than 3 pounds or edema.  Continue carvedilol at current dose  Continue Jardiance  For now we will hold off on spironolactone      Relevant Medications   sacubitril-valsartan (ENTRESTO) 97-103 MG   furosemide (LASIX) 40 MG tablet   Other Relevant Orders   Comprehensive metabolic panel   Essential hypertension (Chronic)    Blood pressures pretty well controlled.  There is definitely room to titrate Entresto further. Continue  carvedilol.      Relevant Medications   sacubitril-valsartan (ENTRESTO) 97-103 MG   furosemide (LASIX) 40 MG tablet   Hyperlipidemia associated with type 2 diabetes mellitus (HCC) target LDL<50, goal<70 (Chronic)    Continue on continue current dose of rosuvastatin.  We will check lipid panel and April.  Pending on those results, may consider adding Zetia.      Relevant Orders   Lipid panel   Morbid obesity with body mass index (BMI) of 40.0 to 44.9 in adult [facility]) (Chronic)    Maintaining current weight, but still has room to lose.  He is still working on diet, but hoping that with the weather improving, he can get back out do more exercise.  Also is looking forward to the COVID-19 restrictions being somewhat lifted to allow him to return to gym.      Coronary artery disease involving native coronary artery of native heart without angina pectoris (Chronic)    Severe native CAD now status post CABG with modest improvement in EF.  He never really had angina throughout this, his presenting  symptoms are basically heart failure.  I am leery of checking Myoview on him for fear of it being simply read as abnormal because of low EF.  He would be due for surveillance Myoview this year or next.  We can discuss when I see him back in follow-up.  Plan: Continue current medications with the exception of increasing Entresto.    Continue beta-blocker, & clopidogrel without aspirin.  On max dose rosuvastatin, tolerating well.  On Jardiance.  Okay to hold Plavix 5-7days preop for any procedures or surgeries.      Relevant Medications   sacubitril-valsartan (ENTRESTO) 97-103 MG   furosemide (LASIX) 40 MG tablet   Other Relevant Orders   Lipid panel   Frequent PVCs (Chronic)    He still had some ectopy noted on exam today, but not as much as last time.  He had bigeminy last time.  He is currently on high-dose carvedilol and has not had any gross arrhythmias noted on ICD evaluation.  We will  forward note to Dr.Taylor and asked that with next ICD download, could we determine PVC burden.  May potentially benefit from AAD.      Relevant Medications   sacubitril-valsartan (ENTRESTO) 97-103 MG   furosemide (LASIX) 40 MG tablet     COVID-19 Education: The signs and symptoms of COVID-19 were discussed with the patient and how to seek care for testing (follow up with PCP or arrange E-visit).   The importance of social distancing was discussed today.  I spent a total of with the patient and chart review. >  50% of the time was spent in direct patient consultation.  Additional time spent with chart review (studies, outside notes, etc): 8 Total Time: 28 min   Current medicines are reviewed at length with the patient today.  (+/- concerns) n/a   Patient Instructions / Medication Changes & Studies & Tests Ordered   Patient Instructions  Medication Instructions:    Complete your current bottle of Entresto, the next week will be for the next higher dose (97/103 mg)  When she start the 97/103 mg Entresto dose, after the first day reduce your furosemide to 1/2 tablet daily  The other 1/2 tablet of furosemide will be used as needed for swelling or shortness of breath when you lie down flat or increasing shortness breath with exertion.  *If you need a refill on your cardiac medications before your next appointment, please call your pharmacy*  Lab Work:   CMP and lipid panel in late April   If you have labs (blood work) drawn today and your tests are completely normal, you will receive your results only by: Marland Kitchen MyChart Message (if you have MyChart) OR . A paper copy in the mail If you have any lab test that is abnormal or we need to change your treatment, we will call you to review the results.  Testing/Procedures: None  Follow-Up: At [facility], you and your health needs are our priority.  As part of our continuing mission to provide you with exceptional heart  care, we have created designated Provider Care Teams.  These Care Teams include your primary Cardiologist (physician) and Advanced Practice Providers (APPs -  Physician Assistants and Nurse Practitioners) who all work together to provide you with the care you need, when you need it.  Your next appointment:   6 month(s)  The format for your next appointment:   Either In Person or Virtual  Provider:   Bryan Lemma, MD  Other  Instructions Keep working stay active.     Studies Ordered:   Orders Placed This Encounter  Procedures  . Lipid panel  . Comprehensive metabolic panel     Glenetta Hew, M.D., M.S. Interventional Cardiologist   Pager # [phone] Phone # [phone] [address] [place], [place] [address]   Thank you for choosing Heartcare at [facility]!!

## 2019-05-23 NOTE — Patient Instructions (Signed)
Medication Instructions:    Complete your current bottle of Entresto, the next week will be for the next higher dose (97/103 mg)  When she start the 97/103 mg Entresto dose, after the first day reduce your furosemide to 1/2 tablet daily  The other 1/2 tablet of furosemide will be used as needed for swelling or shortness of breath when you lie down flat or increasing shortness breath with exertion.  *If you need a refill on your cardiac medications before your next appointment, please call your pharmacy*  Lab Work:   CMP and lipid panel in late April   If you have labs (blood work) drawn today and your tests are completely normal, you will receive your results only by: Marland Kitchen MyChart Message (if you have MyChart) OR . A paper copy in the mail If you have any lab test that is abnormal or we need to change your treatment, we will call you to review the results.  Testing/Procedures: None  Follow-Up: At [facility], you and your health needs are our priority.  As part of our continuing mission to provide you with exceptional heart care, we have created designated Provider Care Teams.  These Care Teams include your primary Cardiologist (physician) and Advanced Practice Providers (APPs -  Physician Assistants and Nurse Practitioners) who all work together to provide you with the care you need, when you need it.  Your next appointment:   6 month(s)  The format for your next appointment:   Either In Person or Virtual  Provider:   Bryan Lemma, MD  Other Instructions Keep working stay active.

## 2019-05-25 ENCOUNTER — Encounter: Payer: Self-pay | Admitting: Cardiology

## 2019-05-25 NOTE — Assessment & Plan Note (Signed)
Continues to be euvolemic without any additional use of Lasix.  I think we can potentially back down on his Lasix. He has blood pressure room to titrate up Entresto.  Plan:  Refill current prescription of Entresto with increased dose of 97/103 mg  With increased dose of Entresto, we will reduce standing dose of furosemide to 20 mg daily with additional 20 to 40 mg daily as needed dyspnea, weight gain more than 3 pounds or edema.  Continue carvedilol at current dose  Continue Jardiance  For now we will hold off on spironolactone

## 2019-05-25 NOTE — Assessment & Plan Note (Signed)
He still had some ectopy noted on exam today, but not as much as last time.  He had bigeminy last time.  He is currently on high-dose carvedilol and has not had any gross arrhythmias noted on ICD evaluation.  We will forward note to Dr.Taylor and asked that with next ICD download, could we determine PVC burden.  May potentially benefit from AAD.

## 2019-05-25 NOTE — Assessment & Plan Note (Signed)
Continue on continue current dose of rosuvastatin.  We will check lipid panel and April.  Pending on those results, may consider adding Zetia.

## 2019-05-25 NOTE — Assessment & Plan Note (Addendum)
Severe native CAD now status post CABG with modest improvement in EF.  He never really had angina throughout this, his presenting symptoms are basically heart failure.  I am leery of checking Myoview on him for fear of it being simply read as abnormal because of low EF.  He would be due for surveillance Myoview this year or next.  We can discuss when I see him back in follow-up.  Plan: Continue current medications with the exception of increasing Entresto.    Continue beta-blocker, & clopidogrel without aspirin.  On max dose rosuvastatin, tolerating well.  On Jardiance.  Okay to hold Plavix 5-7days preop for any procedures or surgeries.

## 2019-05-25 NOTE — Assessment & Plan Note (Signed)
Maintaining current weight, but still has room to lose.  He is still working on diet, but hoping that with the weather improving, he can get back out do more exercise.  Also is looking forward to the COVID-19 restrictions being somewhat lifted to allow him to return to gym.

## 2019-05-25 NOTE — Assessment & Plan Note (Addendum)
Longstanding condition, but had notable improvement in EF post CABG.  Status post ICD.  No active heart failure symptoms.   Plan: We will continue to titrate Entresto with hopes to potentially cut back his Lasix.  Continue current dose of carvedilol.  Is on Jardiance as well.  As he has not required much diuretic, he is not spironolactone.  His ICD is being followed-up by EP

## 2019-05-25 NOTE — Assessment & Plan Note (Signed)
Blood pressures pretty well controlled.  There is definitely room to titrate Entresto further. Continue carvedilol.

## 2019-05-30 ENCOUNTER — Ambulatory Visit (INDEPENDENT_AMBULATORY_CARE_PROVIDER_SITE_OTHER): Payer: 59 | Admitting: *Deleted

## 2019-05-30 DIAGNOSIS — I5042 Chronic combined systolic (congestive) and diastolic (congestive) heart failure: Secondary | ICD-10-CM

## 2019-05-30 LAB — CUP PACEART REMOTE DEVICE CHECK
Battery Remaining Longevity: 25 mo
Battery Remaining Percentage: 25 %
Battery Voltage: 2.81 V
Brady Statistic AP VP Percent: 1 %
Brady Statistic AP VS Percent: 10 %
Brady Statistic AS VP Percent: 1 %
Brady Statistic AS VS Percent: 86 %
Brady Statistic RA Percent Paced: 6.8 %
Brady Statistic RV Percent Paced: 1 %
Date Time Interrogation Session: 20210224024357
HighPow Impedance: 97 Ohm
HighPow Impedance: 97 Ohm
Implantable Lead Implant Date: 20130410
Implantable Lead Implant Date: 20130410
Implantable Lead Location: 753859
Implantable Lead Location: 753860
Implantable Pulse Generator Implant Date: 20130410
Lead Channel Impedance Value: 440 Ohm
Lead Channel Impedance Value: 600 Ohm
Lead Channel Pacing Threshold Amplitude: 1 V
Lead Channel Pacing Threshold Amplitude: 1.25 V
Lead Channel Pacing Threshold Pulse Width: 0.5 ms
Lead Channel Pacing Threshold Pulse Width: 0.5 ms
Lead Channel Sensing Intrinsic Amplitude: 1 mV
Lead Channel Sensing Intrinsic Amplitude: 12 mV
Lead Channel Setting Pacing Amplitude: 2 V
Lead Channel Setting Pacing Amplitude: 2.5 V
Lead Channel Setting Pacing Pulse Width: 0.5 ms
Lead Channel Setting Sensing Sensitivity: 0.5 mV
Pulse Gen Serial Number: 1041794

## 2019-05-31 NOTE — Progress Notes (Signed)
ICD Remote  

## 2019-06-08 ENCOUNTER — Other Ambulatory Visit: Payer: Self-pay | Admitting: Cardiology

## 2019-06-25 NOTE — Telephone Encounter (Signed)
My chart conversation  Good afternoon Dr. Herbie Baltimore,  I have joined the [employer] program at [facility]. My NP wanted me to reach out to you and make sure you were aware.  She also wanted to get you blessing on me exercising. Lastly she wanted me to make you aware that on my first visit (3 weeks ago) I had a pulse rate of 50 bpm, last week it was normal at 73 bpm, but this week was back down to 53 bpm.  This actually happened to me in younoffice as well. When she checked my hr it was around in the area of 55bpm, she checked it again 5 min later and it was up to normal.  I'm not sure what is causing this or if it of concern.  Once could be a fluke, but three times becomes a question.   Thank you for your time.  Respectfully  Brett Drake    Brett Drake,  I am peripherally happy with you exercising.  The heart rate change from 50s to 70s is unusual, but I have no clear way to explain it.  It is not unusual for your blood heart rate to be in the 50s on carvedilol.  Once you then are more engaged in talking, your heart rate may pick back up again.  It is difficult to see what happens to your heart rate when you exercise.  We will forward this note to you PCP.  I have listed with Dr. Clarene Essex, is that the same PCP?  I also see Devra Dopp, NP which looks like it may be more accurate.  We will send to both.  Bryan Lemma, MD  [place], Sadie Haber, NP

## 2019-08-29 ENCOUNTER — Ambulatory Visit (INDEPENDENT_AMBULATORY_CARE_PROVIDER_SITE_OTHER): Payer: 59 | Admitting: *Deleted

## 2019-08-29 DIAGNOSIS — I255 Ischemic cardiomyopathy: Secondary | ICD-10-CM | POA: Diagnosis not present

## 2019-08-29 LAB — CUP PACEART REMOTE DEVICE CHECK
Battery Remaining Longevity: 23 mo
Battery Remaining Percentage: 24 %
Battery Voltage: 2.8 V
Brady Statistic AP VP Percent: 1 %
Brady Statistic AP VS Percent: 11 %
Brady Statistic AS VP Percent: 1 %
Brady Statistic AS VS Percent: 85 %
Brady Statistic RA Percent Paced: 7.4 %
Brady Statistic RV Percent Paced: 1.2 %
Date Time Interrogation Session: 20210526021604
HighPow Impedance: 83 Ohm
HighPow Impedance: 83 Ohm
Implantable Lead Implant Date: 20130410
Implantable Lead Implant Date: 20130410
Implantable Lead Location: 753859
Implantable Lead Location: 753860
Implantable Pulse Generator Implant Date: 20130410
Lead Channel Impedance Value: 400 Ohm
Lead Channel Impedance Value: 580 Ohm
Lead Channel Pacing Threshold Amplitude: 1 V
Lead Channel Pacing Threshold Amplitude: 1.25 V
Lead Channel Pacing Threshold Pulse Width: 0.5 ms
Lead Channel Pacing Threshold Pulse Width: 0.5 ms
Lead Channel Sensing Intrinsic Amplitude: 1 mV
Lead Channel Sensing Intrinsic Amplitude: 12 mV
Lead Channel Setting Pacing Amplitude: 2 V
Lead Channel Setting Pacing Amplitude: 2.5 V
Lead Channel Setting Pacing Pulse Width: 0.5 ms
Lead Channel Setting Sensing Sensitivity: 0.5 mV
Pulse Gen Serial Number: 1041794

## 2019-08-30 NOTE — Progress Notes (Signed)
Remote ICD transmission.   

## 2019-09-09 ENCOUNTER — Other Ambulatory Visit: Payer: Self-pay | Admitting: Cardiology

## 2019-09-26 ENCOUNTER — Other Ambulatory Visit: Payer: Self-pay

## 2019-09-26 ENCOUNTER — Encounter: Payer: Self-pay | Admitting: Adult Health

## 2019-09-26 ENCOUNTER — Ambulatory Visit (INDEPENDENT_AMBULATORY_CARE_PROVIDER_SITE_OTHER): Payer: 59 | Admitting: Adult Health

## 2019-09-26 VITALS — BP 115/72 | HR 76 | Ht 71.0 in | Wt 293.0 lb

## 2019-09-26 DIAGNOSIS — G4733 Obstructive sleep apnea (adult) (pediatric): Secondary | ICD-10-CM

## 2019-09-26 DIAGNOSIS — Z9989 Dependence on other enabling machines and devices: Secondary | ICD-10-CM

## 2019-09-26 NOTE — Progress Notes (Signed)
PATIENT: Brett Drake DOB: [birth year]  REASON FOR VISIT: follow up HISTORY FROM: patient  HISTORY OF PRESENT ILLNESS: Today 09/26/19:  Brett Drake is a 49 year old male with a history of obstructive sleep apnea on CPAP.  His download indicates that he use his machine nightly for compliance of 100%.  He uses machine greater than 4 hours each night.  On average he uses his machine 8 hours and 24 minutes.  His residual AHI is 0.6 on 11 cm of water with EPR 3.  Reports that the CPAP continues to work well for him.  Reports that he has remained fatigued during the day.  States that he goes to bed around 8 or 9 PM and gets up around 4:30 AM.  He works from 5 to 10 PM.  Typically when he gets home from work he may take 1 to 2-hour nap.  States that he does minimal exercise with the exception of when he is at work.  Denies watching TV in bed.  No caffeine after lunch.  He is on vitamin D supplements.  Unsure if his PCP is ever check vitamin B12 levels.  He returns today for an evaluation.  HISTORY 09/25/18:  Brett Drake is a 49 year old male with a history of obstructive sleep apnea on CPAP.  His download indicates that he uses machine 30 out of 30 days for compliance of 100%.  He uses machine greater than 4 hours each night.  On average he uses his machine 8 hours and 23 minutes.  His residual AHI is 2.1 on 11 cm of water with EPR of 3.  He does not have a significant leak.  Reports that he continues to notice the benefit with the CPAP.  REVIEW OF SYSTEMS: Out of a complete 14 system review of symptoms, the patient complains only of the following symptoms, and all other reviewed systems are negative.  [id number]  ALLERGIES: Allergies  Allergen Reactions  . Sulfa Antibiotics Swelling and Rash    SWELLING REACTION UNSPECIFIED   . Penicillins Rash    HOME MEDICATIONS: Outpatient Medications Prior to Visit  Medication Sig Dispense Refill  . aspirin 325 MG tablet Take by mouth.      Marland Kitchen buPROPion (WELLBUTRIN) 75 MG tablet Take 75 mg by mouth daily.    . carvedilol (COREG) 25 MG tablet TAKE ONE TABLET BY MOUTH TWICE A DAY WITH A MEAL 180 tablet 3  . clopidogrel (PLAVIX) 75 MG tablet TAKE ONE TABLET BY MOUTH DAILY 90 tablet 2  . esomeprazole (NEXIUM) 20 MG capsule Take 20 mg by mouth daily at 12 noon.    Marland Kitchen FLUoxetine HCl 60 MG TABS Take 60 mg by mouth daily.    . furosemide (LASIX) 40 MG tablet Take 0.5 tablets (20 mg total) by mouth daily. 30 tablet 11  . HUMALOG KWIKPEN 200 UNIT/ML SOPN Inject 34 Units into the skin 3 (three) times daily before meals.     . Insulin Glargine (BASAGLAR KWIKPEN) 100 UNIT/ML SOPN Inject 44 Units into the skin 2 (two) times daily.     Marland Kitchen JARDIANCE 10 MG TABS tablet Take 10 mg by mouth daily.    Marland Kitchen KLOR-CON M20 20 MEQ tablet TAKE ONE TABLET BY MOUTH DAILY 90 tablet 1  . metFORMIN (GLUCOPHAGE) 500 MG tablet Take 1-2 tablets (500-1,000 mg total) by mouth 2 (two) times daily with a meal. Take 500 mg every morning  Take 1000 mg at bedtime    . oxymetazoline (AFRIN) 0.05 % nasal  spray Place 1 spray into both nostrils 2 (two) times daily.     . rosuvastatin (CRESTOR) 40 MG tablet Take 40 mg by mouth daily.    . sacubitril-valsartan (ENTRESTO) 97-103 MG Take 1 tablet by mouth 2 (two) times daily. 60 tablet 11  . sildenafil (REVATIO) 20 MG tablet TAKE ONE TO THREE TABLETS BY MOUTH AS NEEDED 20 tablet 1  . testosterone cypionate (DEPOTESTOSTERONE CYPIONATE) 200 MG/ML injection      No facility-administered medications prior to visit.    PAST MEDICAL HISTORY: Past Medical History:  Diagnosis Date  . AICD (automatic cardioverter/defibrillator) present 2013   Placed for EF of 35% with inducible VT in 2013 (in [place])  . Anxiety   . Chronic combined systolic and diastolic CHF, NYHA class 2 and ACA/AHA stage C 2010  . Coronary artery disease involving native coronary artery of native heart    ? PRE 2017 (h/o PCI to RCA 100%); 11/2015 - Cath with MV CAD:  100% pRCA, 80% pLAD, 80-90% oCx & oRI, subTO Om1. --> CABG  . Depression   . 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 GERD (gastroesophageal reflux disease)   . Headache   . History of pneumonia 2016  . Hx of CABGx2    LIMA-LAD, SVG-RI - Native Cx & rPDA not amenable to PCI (extensive inferior scar)  . Hyperlipemia   . Hypertension    essential  . Ischemic cardiomyopathy    EF ~35%. Previously diagnosed, but referred for CABG 11/2015. --EF Nov 2018 up to ~40%. - s/p ICD  . Morbid obesity (HCC)   . Sleep apnea    Epworth Score 13  . ST elevation myocardial infarction (STEMI) of inferior wall (HCC) 07/07/2008   [facility]) - 100% RCA - PCI.  Marland Kitchen Urinary frequency    "due to medication"  . Wears glasses     PAST SURGICAL HISTORY: Past Surgical History:  Procedure Laterality Date  . CARDIAC CATHETERIZATION N/A 11/28/2015   Procedure: Left Heart Cath and Coronary Angiography;  Surgeon: [name], MD;  Location: [facility] INVASIVE CV LAB: EF 35% with global hypokinesis/inferior akinesis. pRCA 100% CTO (extensive L-R collaterals). LM - mild Dz. LAD: prox 70-80% (b4 D1) - FFR 0.77. m-dLAD mild diffuse CAD, Small D1 ost 90% diffuse disease. ostRI 90% (small). ostCx - 80-90% (mod diffuse D),  Major OM1 subTO prox.   Marland Kitchen CARDIAC CATHETERIZATION N/A 11/28/2015   Procedure: Intravascular Pressure Wire/FFR Study;  Surgeon: [name], MD;  Location: [facility] INVASIVE CV LAB: pLAD 70-80%, FFR 0.77  . COLONOSCOPY    . CORONARY ARTERY BYPASS GRAFT Bilateral 12/19/2015   Procedure: CORONARY ARTERY BYPASS GRAFTING TIMES 2 (CABG X 2 LIMA-LAD, SVG-RI - Native Cx & RCA not amenable to bypass) - using left internal mammary artery and endoscopic left saphenous vein harvest;  Surgeon: Alleen Borne, MD;  Location: [facility] OR;  Service: Open Heart Surgery;  Laterality: Bilateral;  . CORONARY STENT INTERVENTION  2010   in [place] - 100% RCA - PCI  . EYE SURGERY Left    age 23,to  correct lazy eye  . FINGER SURGERY Left    middle finger  . MASTECTOMY Bilateral 1985   gynecomastia  . PACEMAKER INSERTION  2013   St Jude,implantable defibrillator  . stents  07/08/2008   3   . TEE WITHOUT CARDIOVERSION N/A 12/19/2015   Procedure: TRANSESOPHAGEAL ECHOCARDIOGRAM (TEE);  Surgeon: Alleen Borne, MD;  Location: [facility] OR;  Service: Open Heart Surgery: Pre-op EF ~35% inferior AK & global HK. -> EF improved post-op (EF not reported).  . TRANSTHORACIC ECHOCARDIOGRAM  02/2017   EF 40%.  For septal axis with inferior akinesis.  GR 1 DD.  Poor acoustic windows.  Mild RV dilation with slightly decreased function. -->  Slight improvement when compared to previous echo --> diastolic function now Gr1 (from 2) & EF up to 40% from 30-35%  . TRANSTHORACIC ECHOCARDIOGRAM  02/2019   EF stable at 40%.  Moderate reduced EF with LVH.  Basal and mid inferior wall with basal to mid inferolateral wall.  Severely dilated LA and normal RA.  Reduced RV function with normal PA pressures.  Essentially normal valves.    FAMILY HISTORY: Family History  Problem Relation Age of Onset  . Heart attack Mother 76  . Kidney failure Father   . Dementia Father   . Heart attack Father 57       Multiple MIs --> severe ICM  . Heart failure Father        transplant at age 90  . Cancer Maternal Grandmother   . Heart disease Maternal Grandfather 53       Began in 12s  . Heart disease Paternal Grandmother        Began in 45s  . Heart disease Paternal Grandfather        Begin in 45s    SOCIAL HISTORY: Social History   Socioeconomic History  . Marital status: Married    Spouse name: Judson Roch  . Number of children: 1  . Years of education: college  . Highest education level: Not on file  Occupational History  . Occupation: 1  Tobacco Use  . Smoking status: Former Smoker    Quit date: 07/07/2008    Years since quitting: 11.2  . Smokeless tobacco: Never Used  . Tobacco comment: quit in 2010  Vaping Use  .  Vaping Use: Never used  Substance and Sexual Activity  . Alcohol use: Yes    Alcohol/week: 0.0 standard drinks    Comment: rarely  . Drug use: No  . Sexual activity: Not on file  Other Topics Concern  . Not on file  Social History Narrative  . Not on file   Social Determinants of Health   Financial Resource Strain:   . Difficulty of Paying Living Expenses:   Food Insecurity:   . Worried About Charity fundraiser in the Last Year:   . Arboriculturist in the Last Year:   Transportation Needs:   . Film/video editor (Medical):   Marland Kitchen Lack of Transportation (Non-Medical):   Physical Activity:   . Days of Exercise per Week:   . Minutes of Exercise per Session:   Stress:   . Feeling of Stress :   Social Connections:   . Frequency of Communication with Friends and Family:   . Frequency of Social Gatherings with Friends and Family:   . Attends Religious Services:   . Active Member of Clubs or Organizations:   . Attends Archivist Meetings:   Marland Kitchen Marital Status:   Intimate Partner Violence:   . Fear of Current or Ex-Partner:   . Emotionally Abused:   Marland Kitchen Physically Abused:   . Sexually Abused:       PHYSICAL EXAM  Vitals:   09/26/19 1420  BP: 115/72  Pulse: 76  Weight: 293 lb (132.9 kg)  Height: 5\' 11"  (1.803 m)   Body mass index  is 40.87 kg/m.  Generalized: Well developed, in no acute distress  Chest: Lungs clear to auscultation bilaterally  Neurological examination  Mentation: Alert oriented to time, place, history taking. Follows all commands speech and language fluent Cranial nerve II-XII: Extraocular movements were full, visual field were full on confrontational test Head turning and shoulder shrug  were normal and symmetric. Motor: The motor testing reveals 5 over 5 strength of all 4 extremities. Good symmetric motor tone is noted throughout.  Sensory: Sensory testing is intact to soft touch on all 4 extremities. No evidence of extinction is noted.    Gait and station: Gait is normal.    DIAGNOSTIC DATA (LABS, IMAGING, TESTING) - I reviewed patient records, labs, notes, testing and imaging myself where available.  Lab Results  Component Value Date   WBC 12.6 (H) 12/23/2015   HGB 7.9 (L) 12/23/2015   HCT 25.5 (L) 12/23/2015   MCV 81.0 12/23/2015   PLT 257 12/23/2015      Component Value Date/Time   NA 139 01/03/2019 1001   K 5.2 01/03/2019 1001   CL 99 01/03/2019 1001   CO2 25 01/03/2019 1001   GLUCOSE 169 (H) 01/03/2019 1001   GLUCOSE 166 (H) 12/23/2015 0218   BUN 17 01/03/2019 1001   CREATININE 1.09 01/03/2019 1001   CALCIUM 9.9 01/03/2019 1001   PROT 7.0 01/03/2019 1001   ALBUMIN 4.3 01/03/2019 1001   AST 11 01/03/2019 1001   ALT 12 01/03/2019 1001   ALKPHOS 117 01/03/2019 1001   BILITOT 1.1 01/03/2019 1001   GFRNONAA 80 01/03/2019 1001   GFRAA 92 01/03/2019 1001   Lab Results  Component Value Date   CHOL 123 01/03/2019   HDL 29 (L) 01/03/2019   LDLCALC 70 01/03/2019   TRIG 137 01/03/2019   CHOLHDL 4.2 01/03/2019   Lab Results  Component Value Date   HGBA1C 8.3 (H) 12/17/2015   No results found for: VITAMINB12 No results found for: TSH    ASSESSMENT AND PLAN 49 y.o. year old male  has a past medical history of AICD (automatic cardioverter/defibrillator) present (2013), Anxiety, Chronic combined systolic and diastolic CHF, NYHA class 2 and ACA/AHA stage C (2010), Coronary artery disease involving native coronary artery of native heart, Depression, Diabetes mellitus without complication (HCC), GERD (gastroesophageal reflux disease), Headache, History of pneumonia (2016), Hx of CABGx2, Hyperlipemia, Hypertension, Ischemic cardiomyopathy, Morbid obesity (HCC), Sleep apnea, ST elevation myocardial infarction (STEMI) of inferior wall (HCC) (07/07/2008), Urinary frequency, and Wears glasses. here with:  1. OSA on CPAP  - CPAP compliance excellent - Good treatment of AHI  - Encourage patient to use CPAP  nightly and > 4 hours each night - F/U in 1 year or sooner if needed   I spent 20 minutes of face-to-face and non-face-to-face time with patient.  This included previsit chart review, lab review, study review, order entry, electronic health record documentation, patient education.  Butch Penny, MSN, NP-C 09/26/2019, 2:34 PM [facility] [address], [address] [place], [place] [address] [phone]

## 2019-09-26 NOTE — Patient Instructions (Signed)
Continue using CPAP nightly and greater than 4 hours each night °If your symptoms worsen or you develop new symptoms please let us know.  ° °

## 2019-11-20 ENCOUNTER — Telehealth: Payer: Self-pay | Admitting: Cardiology

## 2019-11-20 NOTE — Telephone Encounter (Signed)
LVM for patient to return call to get follow scheduled with Herbie Baltimore from recall list

## 2019-11-28 ENCOUNTER — Ambulatory Visit (INDEPENDENT_AMBULATORY_CARE_PROVIDER_SITE_OTHER): Payer: 59 | Admitting: *Deleted

## 2019-11-28 DIAGNOSIS — I255 Ischemic cardiomyopathy: Secondary | ICD-10-CM | POA: Diagnosis not present

## 2019-11-30 LAB — CUP PACEART REMOTE DEVICE CHECK
Battery Remaining Longevity: 21 mo
Battery Remaining Percentage: 21 %
Battery Voltage: 2.78 V
Brady Statistic AP VP Percent: 1 %
Brady Statistic AP VS Percent: 11 %
Brady Statistic AS VP Percent: 1 %
Brady Statistic AS VS Percent: 86 %
Brady Statistic RA Percent Paced: 7.8 %
Brady Statistic RV Percent Paced: 1.1 %
Date Time Interrogation Session: 20210825020017
HighPow Impedance: 93 Ohm
HighPow Impedance: 93 Ohm
Implantable Lead Implant Date: 20130410
Implantable Lead Implant Date: 20130410
Implantable Lead Location: 753859
Implantable Lead Location: 753860
Implantable Pulse Generator Implant Date: 20130410
Lead Channel Impedance Value: 440 Ohm
Lead Channel Impedance Value: 610 Ohm
Lead Channel Pacing Threshold Amplitude: 1 V
Lead Channel Pacing Threshold Amplitude: 1.25 V
Lead Channel Pacing Threshold Pulse Width: 0.5 ms
Lead Channel Pacing Threshold Pulse Width: 0.5 ms
Lead Channel Sensing Intrinsic Amplitude: 1 mV
Lead Channel Sensing Intrinsic Amplitude: 12 mV
Lead Channel Setting Pacing Amplitude: 2 V
Lead Channel Setting Pacing Amplitude: 2.5 V
Lead Channel Setting Pacing Pulse Width: 0.5 ms
Lead Channel Setting Sensing Sensitivity: 0.5 mV
Pulse Gen Serial Number: 1041794

## 2019-12-04 NOTE — Progress Notes (Signed)
Remote ICD transmission.   

## 2019-12-05 ENCOUNTER — Other Ambulatory Visit: Payer: Self-pay

## 2019-12-05 DIAGNOSIS — I255 Ischemic cardiomyopathy: Secondary | ICD-10-CM

## 2019-12-05 DIAGNOSIS — E785 Hyperlipidemia, unspecified: Secondary | ICD-10-CM

## 2019-12-05 DIAGNOSIS — E1169 Type 2 diabetes mellitus with other specified complication: Secondary | ICD-10-CM

## 2019-12-05 DIAGNOSIS — I5042 Chronic combined systolic (congestive) and diastolic (congestive) heart failure: Secondary | ICD-10-CM

## 2019-12-05 LAB — HEPATIC FUNCTION PANEL
ALT: 13 [IU]/L (ref 0–44)
AST: 15 [IU]/L (ref 0–40)
Albumin: 4.4 g/dL (ref 4.0–5.0)
Alkaline Phosphatase: 91 [IU]/L (ref 48–121)
Bilirubin Total: 0.8 mg/dL (ref 0.0–1.2)
Bilirubin, Direct: 0.18 mg/dL (ref 0.00–0.40)
Total Protein: 6.8 g/dL (ref 6.0–8.5)

## 2019-12-05 LAB — LIPID PANEL
Chol/HDL Ratio: 6.2 ratio — ABNORMAL HIGH (ref 0.0–5.0)
Cholesterol, Total: 181 mg/dL (ref 100–199)
HDL: 29 mg/dL — ABNORMAL LOW (ref >39)
LDL Chol Calc (NIH): 119 mg/dL — ABNORMAL HIGH (ref 0–99)
Triglycerides: 188 mg/dL — ABNORMAL HIGH (ref 0–149)
VLDL Cholesterol Cal: 33 mg/dL (ref 5–40)

## 2019-12-12 ENCOUNTER — Telehealth: Payer: Self-pay | Admitting: *Deleted

## 2019-12-12 DIAGNOSIS — E785 Hyperlipidemia, unspecified: Secondary | ICD-10-CM

## 2019-12-12 DIAGNOSIS — I5042 Chronic combined systolic (congestive) and diastolic (congestive) heart failure: Secondary | ICD-10-CM

## 2019-12-12 DIAGNOSIS — E1169 Type 2 diabetes mellitus with other specified complication: Secondary | ICD-10-CM

## 2019-12-12 DIAGNOSIS — I2510 Atherosclerotic heart disease of native coronary artery without angina pectoris: Secondary | ICD-10-CM

## 2019-12-12 NOTE — Addendum Note (Signed)
Addended by: Tobin Chad on: 12/12/2019 03:52 PM   Modules accepted: Orders

## 2019-12-12 NOTE — Telephone Encounter (Signed)
Left message for patient to call back - would like to schedule an appointment with [facility]  - lipid clinic. Next appointment will be in Oct 2021 at present .   Result release via MyChart.   Need to know if patient is still taking  Rosuvastatin 40 mg daily

## 2019-12-12 NOTE — Telephone Encounter (Deleted)
-----   Message from Marykay Lex, MD sent at 12/06/2019 12:08 PM EDT ----- Unfortunately, lipid panel looks much worse than it did last year the year before.  Total cholesterol has gone from 1 23-1 81.  Triglycerides have gone up to 188 from 137.  LDL is gone from 70-1 19.  Need to make sure that that he is actually taking his medications including rosuvastatin 40 mg daily.  Would like to see if he can come in to be seen by [facility] to discuss other options including adamant on 70 over Nexletol but potentially PCSK9 inhibitor.  Bryan Lemma, MD

## 2019-12-12 NOTE — Telephone Encounter (Signed)
Patient return call . Lab results given.  patient states he is still taking Rosuvastatin 40 mg  appointment  Schedule  For [facility] pharmacist to discuss lipids options. 01/07/20 at 2 pm . Patient verbalized understanding .

## 2019-12-12 NOTE — Telephone Encounter (Signed)
-----   Message from Pearletha Furl, RPH-CPP sent at 12/06/2019  1:57 PM EDT ----- Please schedule him for Lipid Clinic (pharmacist).  Raquel ----- Message ----- From: Marykay Lex, MD Sent: 12/06/2019  12:08 PM EDT To: Tobin Chad, RN, #  Unfortunately, lipid panel looks much worse than it did last year the year before.  Total cholesterol has gone from 1 23-1 81.  Triglycerides have gone up to 188 from 137.  LDL is gone from 70-1 19.  Need to make sure that that he is actually taking his medications including rosuvastatin 40 mg daily.  Would like to see if he can come in to be seen by [facility] to discuss other options including adamant on 70 over Nexletol but potentially PCSK9 inhibitor.  Bryan Lemma, MD

## 2019-12-31 ENCOUNTER — Ambulatory Visit (INDEPENDENT_AMBULATORY_CARE_PROVIDER_SITE_OTHER): Payer: 59 | Admitting: Cardiology

## 2019-12-31 ENCOUNTER — Encounter: Payer: Self-pay | Admitting: Cardiology

## 2019-12-31 ENCOUNTER — Other Ambulatory Visit: Payer: Self-pay

## 2019-12-31 VITALS — BP 124/86 | HR 69 | Temp 96.8°F | Ht 71.0 in | Wt 297.0 lb

## 2019-12-31 DIAGNOSIS — I2510 Atherosclerotic heart disease of native coronary artery without angina pectoris: Secondary | ICD-10-CM

## 2019-12-31 DIAGNOSIS — I255 Ischemic cardiomyopathy: Secondary | ICD-10-CM | POA: Diagnosis not present

## 2019-12-31 DIAGNOSIS — E1169 Type 2 diabetes mellitus with other specified complication: Secondary | ICD-10-CM

## 2019-12-31 DIAGNOSIS — I5042 Chronic combined systolic (congestive) and diastolic (congestive) heart failure: Secondary | ICD-10-CM | POA: Diagnosis not present

## 2019-12-31 DIAGNOSIS — Z6841 Body Mass Index (BMI) 40.0 and over, adult: Secondary | ICD-10-CM

## 2019-12-31 DIAGNOSIS — I10 Essential (primary) hypertension: Secondary | ICD-10-CM

## 2019-12-31 DIAGNOSIS — I4729 Other ventricular tachycardia: Secondary | ICD-10-CM

## 2019-12-31 DIAGNOSIS — I472 Ventricular tachycardia: Secondary | ICD-10-CM

## 2019-12-31 DIAGNOSIS — E785 Hyperlipidemia, unspecified: Secondary | ICD-10-CM

## 2019-12-31 NOTE — Progress Notes (Signed)
Primary Care Provider: Tracey Harries, MD Cardiologist: Bryan Lemma, MD Electrophysiologist: None  Clinic Note: Chief Complaint  Patient presents with  . Follow-up    6 months.  . Coronary Artery Disease    No angina  . Cardiomyopathy    Minimal CHF symptoms   HPI:    Brett Drake is a 49 y.o. male with a PMH of longstanding CAD and ischemic cardiomyopathy who presents today for delayed 24-month follow-up..  Long history ofCAD  Acute inferior STEMI April 2010 ([place]):PCI to the occluded RCA   Ischemic cardiomyopathy (EF 35%)/inducible VT: ICD placed in 2013   10/2015: Abnormal thoracic impedance with his ICD,->Myoview Nuc ST =>HIGH RISK w/very large severe defect in inferior inferolateral and inferior wall. EF was estimated 31%. Inferior akinesis. ? [id number] catheterization: Severe CAD with CTO of RCA &OM1 with severe LAD, Cx &RI disease ->CABG. ? CABGx2:LIMA-LAD, SVG-RI); Cx & rPDA not amenable for bypass. Extensive inferior scar.  TTE post CABG - EF ~40%. Inferior Akinesis. Gr1 D. Mild RV dilation with decreased function (only modest increase in EF)  Nov 2018 TTE : EF roughly 40%.  Inferior akinesis.  Inferoseptal hypokinesis.  GRII DD.  Technically difficult study  02/28/2019 - TTE: EF is 40%.  Moderate reduced EF with LVH.  Basal -mid inferior wall & inferolateral wall severely hypokinetic/akinetic.  Severely dilated LA and normal RA.  Reduced RV function with normal PA pressures.  Essentially normal valves.  Also has PMH: Severe OSA on CPAP, DM-2 on insulin and Metformin, hypertension hyperlipidemia, morbid obesity (with recent weight loss)  Brett Drake was last seen on on May 23, 2019 -> noted he was doing fairly well from a cardiac standpoint. Limited mostly by arthritis pains and fatigue complicated by cold weather. Feels worn out by the end of the day. Runs out of energy. Not doing certain activities just the accumulation of the  day. Still very active and able to do plenty of lifting and other activities in her work. Does get short of breath if he overexerts but no chest pain or pressure.  --> Trying to adjust into a healthy diet, but not able to lose weight. --> Using CPAP religiously   Labs were just checked, and before I realized that he was being seen today, we have scheduled him to see CV[facility] on Monday, October 4th.  He does indicate that he had been started on testosterone replacement because of his fatigue. Was concerned that maybe this could have affected his lipids.  Recent Hospitalizations: none  Reviewed  CV studies:    The following studies were reviewed today: (if available, images/films reviewed: From Epic Chart or Care Everywhere) . n/a:   Interval History:   Brett Drake presents here for follow-up overall doing fairly well. He is not really having too much the way they have any chest tightness or pressure with rest or exertion. Still has a little bit of energy issues. He is on testosterone replacement and that has made a big difference. He is little bit concerned though because his lipids were on the wrong direction.  He has not had any further V. tach episodes. Maybe a few skipped beats here and there but nothing prolonged.  No chest pain or pressure pressure exertion. No heart failure symptoms of PND, orthopnea with only trivial edema.  CV Review of Symptoms (Summary): positive for - irregular heartbeat, palpitations and Exertional dyspnea if he overdoes it. Not with routine activity. Trivial edema. negative for - paroxysmal nocturnal dyspnea, rapid  heart rate, shortness of breath or Syncope/near syncope, TIA/amaurosis fugax, claudication  The patient does not have symptoms concerning for COVID-19 infection (fever, chills, cough, or new shortness of breath).   REVIEWED OF SYSTEMS   Review of Systems  Constitutional: Negative for malaise/fatigue (Doing better with testosterone  injections and CPAP.) and weight loss.  HENT: Positive for congestion (Can make wearing his CPAP difficult).   Respiratory: Positive for shortness of breath (Only with vigorous exertion).   Cardiovascular: Positive for leg swelling (Trivial).  Gastrointestinal: Negative for abdominal pain, blood in stool and melena.  Genitourinary: Negative for hematuria.  Musculoskeletal: Positive for back pain and joint pain. Negative for myalgias.  Neurological: Negative for dizziness, focal weakness and headaches.  Psychiatric/Behavioral: Negative for depression and memory loss. The patient is not nervous/anxious and does not have insomnia.     I have reviewed and (if needed) personally updated the patient's problem list, medications, allergies, past medical and surgical history, social and family history.   PAST MEDICAL HISTORY   Past Medical History:  Diagnosis Date  . AICD (automatic cardioverter/defibrillator) present 2013   Placed for EF of 35% with inducible VT [place]  . Anxiety   . Chronic combined systolic and diastolic CHF, NYHA class 2 and ACA/AHA stage C 2010  . Coronary artery disease involving native coronary artery of native heart    ? PRE 2017 (h/o PCI to RCA 100%); 11/2015 - Cath with MV CAD: 100% pRCA, 80% pLAD, 80-90% oCx & oRI, subTO Om1. --> CABG  . Depression   . 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 GERD (gastroesophageal reflux disease)   . Headache   . History of pneumonia 2016  . Hx of CABGx2    LIMA-LAD, SVG-RI - Native Cx & rPDA not amenable to PCI (extensive inferior scar)  . Hyperlipemia   . Hypertension    essential  . Ischemic cardiomyopathy    EF ~35%. Previously diagnosed, but referred for CABG 11/2015. --EF Nov 2018 up to ~40%. - s/p ICD  . Morbid obesity (HCC)   . Sleep apnea    Epworth Score 13  . ST elevation myocardial infarction (STEMI) of inferior wall (HCC) 07/07/2008   [facility]([place])  - 100% RCA - PCI.  Marland Kitchen. Urinary frequency    "due to medication"  . Wears glasses     PAST SURGICAL HISTORY   Past Surgical History:  Procedure Laterality Date  . CARDIAC CATHETERIZATION N/A 11/28/2015   Procedure: Left Heart Cath and Coronary Angiography;  Surgeon: Yates DecampJay Ganji, MD;  [facility]: EF 35% with global hypokinesis/inferior akinesis. pRCA 100% CTO (extensive L-R collaterals). LM - mild Dz. LAD: prox 70-80% (b4 D1) - FFR 0.77. m-dLAD mild diffuse CAD, Small D1 ost 90% diffuse disease. ostRI 90% (small). ostCx - 80-90% (mod diffuse D),  Major OM1 subTO prox.   Marland Kitchen. CARDIAC CATHETERIZATION N/A 11/28/2015   Procedure: Intravascular Pressure Wire/FFR Study;  Surgeon: Yates DecampJay Ganji, MD;  [facility]: pLAD 70-80%, FFR 0.77  . COLONOSCOPY    . CORONARY ARTERY BYPASS GRAFT Bilateral 12/19/2015   Procedure: CORONARY ARTERY BYPASS GRAFTING TIMES 2 (CABG X 2 LIMA-LAD, SVG-RI - Native Cx & RCA not amenable to bypass) - using left internal mammary artery and endoscopic left saphenous vein harvest;  Surgeon: Alleen BorneBryan K Bartle, MD;  Location: [facility] OR;  Service: Open Heart Surgery;  Laterality: Bilateral;  . CORONARY STENT INTERVENTION  2010   in [place] - 100% RCA - PCI  . EYE SURGERY Left    age 87,to correct lazy eye  . FINGER SURGERY Left    middle finger  . MASTECTOMY Bilateral 1985   gynecomastia  . PACEMAKER INSERTION  2013   St Jude,implantable defibrillator  . stents  07/08/2008   3   . TEE WITHOUT CARDIOVERSION N/A 12/19/2015   Procedure: TRANSESOPHAGEAL ECHOCARDIOGRAM (TEE);  Surgeon: Alleen Borne, MD;  Location: [facility] OR;  Service: Open Heart Surgery: Pre-op EF ~35% inferior AK & global HK. -> EF improved post-op (EF not reported).  . TRANSTHORACIC ECHOCARDIOGRAM  02/2017   EF 40%.  For septal axis with inferior akinesis.  GR 1 DD.  Poor acoustic windows.  Mild RV dilation with slightly decreased function. -->  Slight improvement when compared to previous echo -->  diastolic function now Gr1 (from 2) & EF up to 40% from 30-35%  . TRANSTHORACIC ECHOCARDIOGRAM  02/2019   EF stable at 40%.  Moderate reduced EF with LVH.  Basal and mid inferior wall with basal to mid inferolateral wall.  Severely dilated LA and normal RA.  Reduced RV function with normal PA pressures.  Essentially normal valves.   .  Immunization History  Administered Date(s) Administered  . PFIZER SARS-COV-2 Vaccination 09/02/2019, 09/29/2019     MEDICATIONS/ALLERGIES   Current Meds  Medication Sig  . buPROPion (WELLBUTRIN) 75 MG tablet Take 75 mg by mouth daily.  . carvedilol (COREG) 25 MG tablet TAKE ONE TABLET BY MOUTH TWICE A DAY WITH A MEAL  . Cholecalciferol 25 MCG (1000 UT) tablet Take by mouth.  . clopidogrel (PLAVIX) 75 MG tablet TAKE ONE TABLET BY MOUTH DAILY  . cyanocobalamin 100 MCG tablet Take by mouth.  . esomeprazole (NEXIUM) 20 MG capsule Take 20 mg by mouth daily at 12 noon.  Marland Kitchen FLUoxetine (PROZAC) 20 MG capsule Take 60 mg by mouth every morning.  . furosemide (LASIX) 40 MG tablet Take 0.5 tablets (20 mg total) by mouth daily.  Marland Kitchen HUMALOG KWIKPEN 200 UNIT/ML SOPN Inject 34 Units into the skin 3 (three) times daily before meals.   . Insulin Glargine (BASAGLAR KWIKPEN) 100 UNIT/ML SOPN Inject 44 Units into the skin 2 (two) times daily.   Marland Kitchen JARDIANCE 25 MG TABS tablet Take 25 mg by mouth daily.  Marland Kitchen KLOR-CON M20 20 MEQ tablet TAKE ONE TABLET BY MOUTH DAILY  . Lancets (ONETOUCH ULTRASOFT) lancets   . metFORMIN (GLUCOPHAGE-XR) 500 MG 24 hr tablet Take 500 mg by mouth 3 (three) times daily.  . Multiple Vitamins-Minerals (CENTRUM MEN) TABS Take 1 tablet by mouth daily.  Marland Kitchen oxymetazoline (AFRIN) 0.05 % nasal spray Place 1 spray into both nostrils 2 (two) times daily.   . rosuvastatin (CRESTOR) 40 MG tablet Take 40 mg by mouth daily.  . sacubitril-valsartan (ENTRESTO) 97-103 MG Take 1 tablet by mouth 2 (two) times daily.  Marland Kitchen testosterone cypionate (DEPOTESTOSTERONE CYPIONATE)  200 MG/ML injection   . [DISCONTINUED] aspirin 325 MG tablet Take by mouth.    Allergies  Allergen Reactions  . Sulfa Antibiotics Swelling and Rash    SWELLING REACTION UNSPECIFIED   . Penicillins Rash    SOCIAL HISTORY/FAMILY HISTORY   Reviewed in Epic:  Pertinent findings: None  OBJCTIVE -PE, EKG, labs   Wt Readings from Last 3 Encounters:  12/31/19 297 lb (134.7 kg)  09/26/19 293 lb (132.9 kg)  05/23/19 299 lb (135.6 kg)    Physical Exam: BP 124/86  Pulse 69   Temp (!) 96.8 F (36 C)   Ht 5\' 11"  (1.803 m)   Wt 297 lb (134.7 kg)   SpO2 97%   BMI 41.42 kg/m  Physical Exam Vitals reviewed.  Constitutional:      General: He is not in acute distress.    Appearance: He is not ill-appearing or toxic-appearing.     Comments: Morbidly obese. well-groomed.  HENT:     Head: Normocephalic and atraumatic.  Eyes:     Comments: Left eye has poor lateral tracking--"lazy "  Neck:     Vascular: Carotid bruit and JVD (7-8 cmH2O) present. No hepatojugular reflux (Minimal).  Cardiovascular:     Rate and Rhythm: Normal rate and regular rhythm. Occasional extrasystoles are present.    Chest Wall: PMI is not displaced (Difficult to palpate due to body habitus).     Pulses: Decreased pulses (Diminished with palpable pedal pulses).     Heart sounds: S1 normal and S2 normal. Heart sounds are distant. No murmur heard.  No friction rub. Gallop present. S4 sounds present.   Pulmonary:     Effort: Pulmonary effort is normal.     Breath sounds: Normal breath sounds.  Chest:     Chest wall: Tenderness present.  Abdominal:     General: Abdomen is flat. Bowel sounds are normal. There is no distension.     Palpations: Abdomen is soft. There is no mass.     Comments: Truncal obesity. Unable to palpate HSM.  Musculoskeletal:        General: Swelling (Trivial) present. Normal range of motion.     Cervical back: Normal range of motion.  Skin:    Comments: Bilateral mild venous stasis  changes with varicose veins and spider nevi.  Neurological:     General: No focal deficit present.     Mental Status: He is alert and oriented to person, place, and time. Mental status is at baseline.  Psychiatric:        Mood and Affect: Mood normal.        Behavior: Behavior normal.        Thought Content: Thought content normal.        Judgment: Judgment normal.       Adult ECG Report  Rate: 69 ;  Rhythm: normal sinus rhythm and Cannot rule out anterior MI, age undetermined. No PVCs.;   Narrative Interpretation: Otherwise stable EKG.  Recent Labs: Had just restarted atorvastatin. Lab Results  Component Value Date   CHOL 181 12/05/2019   HDL 29 (L) 12/05/2019   LDLCALC 119 (H) 12/05/2019   TRIG 188 (H) 12/05/2019   CHOLHDL 6.2 (H) 12/05/2019   Lab Results  Component Value Date   CREATININE 1.09 01/03/2019   BUN 17 01/03/2019   NA 139 01/03/2019   K 5.2 01/03/2019   CL 99 01/03/2019   CO2 25 01/03/2019   No results found for: TSH  ASSESSMENT/PLAN    Problem List Items Addressed This Visit    Ischemic cardiomyopathy (Chronic)    This is been a longstanding issue. Unfortunately, his EF did not improve dramatically post CABG. Despite this he has no active heart failure symptoms.  Plan: On max dose Entresto and carvedilol along with empagliflozin. Minimal furosemide use, not requiring as needed dosing. NYHA class II symptoms  ICD being followed by PCP.      Chronic combined systolic and diastolic heart failure, NYHA class 2 (HCC) (Chronic)    NYHA class II symptoms. We  backed down on his Lasix to 20 mg daily with the final titration of Entresto. Not requiring any additional Lasix with the addition of Jardiance.  On max dose Entresto plus carvedilol.  Since he remains euvolemic, I would hold off on spironolactone.      Essential hypertension (Chronic)    Blood pressure looks great on high-dose carvedilol and Entresto. On minimal diuretic.      Hyperlipidemia  associated with type 2 diabetes mellitus (HCC) target LDL<50, goal<70 (Chronic)    Lipids look like they went in the wrong direction. Not sure if this is because of his testosterone injections and him not being on rosuvastatin.  He is already scheduled to be seen in CV[facility] lipid clinic later next week.  We will recheck lipids per CVRR plan      Relevant Medications   metFORMIN (GLUCOPHAGE-XR) 500 MG 24 hr tablet   JARDIANCE 25 MG TABS tablet   Morbid obesity with body mass index (BMI) of 40.0 to 44.9 in adult [facility]) (Chronic)    Weight has been going up and down. Part of the importance of adjusting her diet and try to increase exercise.  He has been moaning the fact these has a hard time getting back to the [facility]. Trying to set up a home gym.      Relevant Medications   metFORMIN (GLUCOPHAGE-XR) 500 MG 24 hr tablet   JARDIANCE 25 MG TABS tablet   NSVT (nonsustained ventricular tachycardia) (HCC) (Chronic)    No further symptoms noted. He feels rare skipped beats but nothing prolonged. Continue high-dose beta-blocker. ICD in place.      Coronary artery disease involving native coronary artery of native heart without angina pectoris - Primary (Chronic)    Severe native CAD now status post CABG with minimal EF improvement. Despite this, his symptoms are notably improved as far as dyspnea and angina. Class I angina class II CHF.  Can consider reevaluation for ischemia with stress test at next follow-up..  Plan: Continue current dose of Entresto and carvedilol along with statin.  Continue Jardiance  On max dose rosuvastatin, but just restarted -> not sure how this is being affected by him being on testosterone. (CVRR consult already in).  DC aspirin, continue Plavix. Okay to hold Plavix 507 days preop for any surgeries or procedures.          COVID-19 Education: The signs and symptoms of COVID-19 were discussed with the patient and how to seek care for testing (follow up with PCP  or arrange E-visit).   The importance of social distancing and COVID-19 vaccination was discussed today.  The patient is practicing social distancing & Masking.   I spent a total of with the patient spent in direct patient consultation.  Additional time spent with chart review  / charting (studies, outside notes, etc): 10 Total Time: 36 min   Current medicines are reviewed at length with the patient today.  (+/- concerns) concerned about the effects of testosterone on his lipids.  Notice: This dictation was prepared with Dragon dictation along with smaller phrase technology. Any transcriptional errors that result from this process are unintentional and may not be corrected upon review.  Patient Instructions / Medication Changes & Studies & Tests Ordered   Patient Instructions  Medication Instructions:  Stop Aspirin  *If you need a refill on your cardiac medications before your next appointment, please call your pharmacy*   Lab Work: None If you have labs (blood work) drawn today and your  tests are completely normal, you will receive your results only by: Marland Kitchen MyChart Message (if you have MyChart) OR . A paper copy in the mail If you have any lab test that is abnormal or we need to change your treatment, we will call you to review the results.   Testing/Procedures: None   Follow-Up: At [facility], you and your health needs are our priority.  As part of our continuing mission to provide you with exceptional heart care, we have created designated Provider Care Teams.  These Care Teams include your primary Cardiologist (physician) and Advanced Practice Providers (APPs -  Physician Assistants and Nurse Practitioners) who all work together to provide you with the care you need, when you need it.  We recommend signing up for the patient portal called "MyChart".  Sign up information is provided on this After Visit Summary.  MyChart is used to connect with patients for Virtual  Visits (Telemedicine).  Patients are able to view lab/test results, encounter notes, upcoming appointments, etc.  Non-urgent messages can be sent to your provider as well.   To learn more about what you can do with MyChart, go to [email or web address].    Your next appointment:   12 month(s)  The format for your next appointment:   In Person  Provider:   Bryan Lemma, MD   Other Instructions Keep your appointment with our Pharmacist on 01/07/20     Studies Ordered:   No orders of the defined types were placed in this encounter.    Bryan Lemma, M.D., M.S. Interventional Cardiologist   Pager # [phone] Phone # [phone] [address] [place], [place] [address]   Thank you for choosing Heartcare at [facility]!!

## 2019-12-31 NOTE — Patient Instructions (Signed)
Medication Instructions:  Stop Aspirin  *If you need a refill on your cardiac medications before your next appointment, please call your pharmacy*   Lab Work: None If you have labs (blood work) drawn today and your tests are completely normal, you will receive your results only by: Marland Kitchen MyChart Message (if you have MyChart) OR . A paper copy in the mail If you have any lab test that is abnormal or we need to change your treatment, we will call you to review the results.   Testing/Procedures: None   Follow-Up: At [facility], you and your health needs are our priority.  As part of our continuing mission to provide you with exceptional heart care, we have created designated Provider Care Teams.  These Care Teams include your primary Cardiologist (physician) and Advanced Practice Providers (APPs -  Physician Assistants and Nurse Practitioners) who all work together to provide you with the care you need, when you need it.  We recommend signing up for the patient portal called "MyChart".  Sign up information is provided on this After Visit Summary.  MyChart is used to connect with patients for Virtual Visits (Telemedicine).  Patients are able to view lab/test results, encounter notes, upcoming appointments, etc.  Non-urgent messages can be sent to your provider as well.   To learn more about what you can do with MyChart, go to [email or web address].    Your next appointment:   12 month(s)  The format for your next appointment:   In Person  Provider:   Bryan Lemma, MD   Other Instructions Keep your appointment with our Pharmacist on 01/07/20

## 2020-01-04 ENCOUNTER — Telehealth: Payer: Self-pay | Admitting: Emergency Medicine

## 2020-01-04 ENCOUNTER — Encounter: Payer: Self-pay | Admitting: Cardiology

## 2020-01-04 NOTE — Assessment & Plan Note (Signed)
Weight has been going up and down. Part of the importance of adjusting her diet and try to increase exercise.  He has been moaning the fact these has a hard time getting back to the [facility]. Trying to set up a home gym.

## 2020-01-04 NOTE — Assessment & Plan Note (Signed)
This is been a longstanding issue. Unfortunately, his EF did not improve dramatically post CABG. Despite this he has no active heart failure symptoms.  Plan: On max dose Entresto and carvedilol along with empagliflozin. Minimal furosemide use, not requiring as needed dosing. NYHA class II symptoms  ICD being followed by PCP.

## 2020-01-04 NOTE — Assessment & Plan Note (Signed)
No further symptoms noted. He feels rare skipped beats but nothing prolonged. Continue high-dose beta-blocker. ICD in place.

## 2020-01-04 NOTE — Assessment & Plan Note (Signed)
NYHA class II symptoms. We backed down on his Lasix to 20 mg daily with the final titration of Entresto. Not requiring any additional Lasix with the addition of Jardiance.  On max dose Entresto plus carvedilol.  Since he remains euvolemic, I would hold off on spironolactone.

## 2020-01-04 NOTE — Telephone Encounter (Signed)
LMOM to call DC , # and office hours provided. Received alert that 9/29/21at 0904 patient had episode of VF that was treated successfully by ATP x 1 successfully. Need to assess if symptomatic. Coreg 25 mg BID .

## 2020-01-04 NOTE — Assessment & Plan Note (Signed)
Lipids look like they went in the wrong direction. Not sure if this is because of his testosterone injections and him not being on rosuvastatin.  He is already scheduled to be seen in [facility] lipid clinic later next week.  We will recheck lipids per [facility] plan

## 2020-01-04 NOTE — Assessment & Plan Note (Addendum)
Severe native CAD now status post CABG with minimal EF improvement. Despite this, his symptoms are notably improved as far as dyspnea and angina. Class I angina class II CHF.  Can consider reevaluation for ischemia with stress test at next follow-up..  Plan: Continue current dose of Entresto and carvedilol along with statin.  Continue Jardiance  On max dose rosuvastatin, but just restarted -> not sure how this is being affected by him being on testosterone. (CV[facility] consult already in).  DC aspirin, continue Plavix. Okay to hold Plavix 507 days preop for any surgeries or procedures.

## 2020-01-04 NOTE — Assessment & Plan Note (Signed)
Blood pressure looks great on high-dose carvedilol and Entresto. On minimal diuretic.

## 2020-01-07 ENCOUNTER — Ambulatory Visit (INDEPENDENT_AMBULATORY_CARE_PROVIDER_SITE_OTHER): Payer: 59 | Admitting: Pharmacist Clinician (PhC)/ Clinical Pharmacy Specialist

## 2020-01-07 ENCOUNTER — Other Ambulatory Visit: Payer: Self-pay

## 2020-01-07 DIAGNOSIS — E1169 Type 2 diabetes mellitus with other specified complication: Secondary | ICD-10-CM

## 2020-01-07 DIAGNOSIS — E785 Hyperlipidemia, unspecified: Secondary | ICD-10-CM | POA: Diagnosis not present

## 2020-01-07 NOTE — Addendum Note (Signed)
Addended by: Myna Hidalgo A on: 01/07/2020 03:32 PM   Modules accepted: Orders

## 2020-01-07 NOTE — Progress Notes (Signed)
01/07/2020 Brett Drake 1970-06-03 [id number]   HPI:  Brett Drake is a 49 y.o. male patient of Dr Herbie Baltimore, who presents today for a lipid clinic evaluation.  See pertinent past medical history below.  He was seen by Dr. Herbie Baltimore last month and noted to be limited by arthritis pains and fatigue, which is complicated by cold weather.  He was recently started on testosterone replacement and notes this has made a big difference in his energy levels.  Is concerned that this might be the cause of his increased lipid readings.  (there is no direct correlation)    Past Medical History: CAD Anterior STEMI 07/2008 w/ PCI to RCA, CABG x 2  CHF Chronic combined - on max dose Entresto, carvedilol, furosemide 60 mg  hypertension Controlled with carvedilol, Entresto,   DM2 12/2019 A1c 7.1; on metformin, Humalog, Basaglar, Jaridance  OSA On CPAP   Current Medications: rosuvastatin 40 mg qd  Cholesterol Goals: LDL < 70   Intolerant/previously tried: none  Family history:  Notes that most males in his father's family did not live past 45 yrs; father died at 30 from kidney disease; mgf had heart disease; mother living mostly healthy, 22, no issues; no siblings, no children  Diet: mix of home/out - mostly chicken, not so much pork/beef; no fried foods at home, but occasional when eating out; tries to eat more veggies, admits could be more;  Cookies after dinner   Exercise:  No regular exercise  Labs: 12/05/19: TC 181, TG 188, HDL 29, LDL 119 (not sure, thinks ran out for a couple of weeks prior)  12/2018:  TC 123, TG 137, HDL 29, LDL 70 (on rosuvastatin)  Current Outpatient Medications  Medication Sig Dispense Refill   buPROPion (WELLBUTRIN) 75 MG tablet Take 75 mg by mouth daily.     carvedilol (COREG) 25 MG tablet TAKE ONE TABLET BY MOUTH TWICE A DAY WITH A MEAL 180 tablet 3   Cholecalciferol 25 MCG (1000 UT) tablet Take by mouth.     clopidogrel (PLAVIX) 75 MG tablet TAKE ONE TABLET BY  MOUTH DAILY 90 tablet 2   cyanocobalamin 100 MCG tablet Take by mouth.     esomeprazole (NEXIUM) 20 MG capsule Take 20 mg by mouth daily at 12 noon.     FLUoxetine (PROZAC) 20 MG capsule Take 60 mg by mouth every morning.     furosemide (LASIX) 40 MG tablet Take 0.5 tablets (20 mg total) by mouth daily. 30 tablet 11   HUMALOG KWIKPEN 200 UNIT/ML SOPN Inject 34 Units into the skin 3 (three) times daily before meals.      Insulin Glargine (BASAGLAR KWIKPEN) 100 UNIT/ML SOPN Inject 44 Units into the skin 2 (two) times daily.      JARDIANCE 25 MG TABS tablet Take 25 mg by mouth daily.     KLOR-CON M20 20 MEQ tablet TAKE ONE TABLET BY MOUTH DAILY 90 tablet 1   Lancets (ONETOUCH ULTRASOFT) lancets      metFORMIN (GLUCOPHAGE-XR) 500 MG 24 hr tablet Take 500 mg by mouth 3 (three) times daily.     Multiple Vitamins-Minerals (CENTRUM MEN) TABS Take 1 tablet by mouth daily.     oxymetazoline (AFRIN) 0.05 % nasal spray Place 1 spray into both nostrils 2 (two) times daily.      rosuvastatin (CRESTOR) 40 MG tablet Take 40 mg by mouth daily.     sacubitril-valsartan (ENTRESTO) 97-103 MG Take 1 tablet by mouth 2 (two) times daily. 60 tablet 11  testosterone cypionate (DEPOTESTOSTERONE CYPIONATE) 200 MG/ML injection      No current facility-administered medications for this visit.    Allergies  Allergen Reactions   Sulfa Antibiotics Swelling and Rash    SWELLING REACTION UNSPECIFIED    Penicillins Rash    Past Medical History:  Diagnosis Date   AICD (automatic cardioverter/defibrillator) present 2013   Placed for EF of 35% with inducible VT in 2013 (in [place])   Anxiety    Chronic combined systolic and diastolic CHF, NYHA class 2 and ACA/AHA stage C 2010   Coronary artery disease involving native coronary artery of native heart    ? PRE 2017 (h/o PCI to RCA 100%); 11/2015 - Cath with MV CAD: 100% pRCA, 80% pLAD, 80-90% oCx & oRI, subTO Om1. --> CABG   Depression    Diabetes  mellitus without complication (HCC)    type ll,uncontrolled with renal complications   GERD (gastroesophageal reflux disease)    Headache    History of pneumonia 2016   Hx of CABGx2    LIMA-LAD, SVG-RI - Native Cx & rPDA not amenable to PCI (extensive inferior scar)   Hyperlipemia    Hypertension    essential   Ischemic cardiomyopathy    EF ~35%. Previously diagnosed, but referred for CABG 11/2015. --EF Nov 2018 up to ~40%. - s/p ICD   Morbid obesity (HCC)    Sleep apnea    Epworth Score 13   ST elevation myocardial infarction (STEMI) of inferior wall (HCC) 07/07/2008   ([place]) - 100% RCA - PCI.   Urinary frequency    "due to medication"   Wears glasses     Blood pressure 100/60, pulse 83, resp. rate 17, height 5\' 11"  (1.803 m), weight 299 lb 6.4 oz (135.8 kg), SpO2 96 %.   Hyperlipidemia associated with type 2 diabetes mellitus (HCC) target LDL<50, goal<70 Patient with mixed hyperlipidemia currently not at goal on rosuvastatin 40 mg daily.  Reviewed options for lowering LDL cholesterol - PCSK-9 inhibitor.  Discussed mechanisms of action, dosing, side effects and potential decreases in LDL cholesterol.  Answered all patient questions.  Based on this information, patient agreeable to start Repatha 140 mg SureClick every 14 days.  Will start paperwork to get approval from [facility] and notify patient once approved.  He will need a repeat of lipid labs in about 2-3 months.      [facility] PharmD CPP [facility] [address] [place], [place] [phone]

## 2020-01-07 NOTE — Assessment & Plan Note (Signed)
Patient with mixed hyperlipidemia currently not at goal on rosuvastatin 40 mg daily.  Reviewed options for lowering LDL cholesterol - PCSK-9 inhibitor.  Discussed mechanisms of action, dosing, side effects and potential decreases in LDL cholesterol.  Answered all patient questions.  Based on this information, patient agreeable to start Repatha 140 mg SureClick every 14 days.  Will start paperwork to get approval from [facility] and notify patient once approved.  He will need a repeat of lipid labs in about 2-3 months.

## 2020-01-07 NOTE — Progress Notes (Signed)
Thanks [employer].  I try to keep you guys busy with Lipids & HTN-- helps me out tremendously.  DH

## 2020-01-07 NOTE — Patient Instructions (Addendum)
Your Results:             Your most recent labs Goal  Total Cholesterol 181 <200  Triglycerides 188 < 150  HDL (happy/good cholesterol) 29 > 40  LDL (lousy/bad cholesterol) 119 < 70     Medication changes:  We will start the prior authorization process for Repatha (or Praluent).  [place] will call you once approved.    Lab orders:  We will mail a lab order to repeat cholesterol labs (fasting) around the first week of December)   Thank you for choosing [facility]    High Triglycerides Eating Plan Triglycerides are a type of fat in the blood. High levels of triglycerides can increase your risk of heart disease and stroke. If your triglyceride levels are high, choosing the right foods can help lower your triglycerides and keep your heart healthy. Work with your health care provider or a diet and nutrition specialist (dietitian) to develop an eating plan that is right for you. What are tips for following this plan? General guidelines   Lose weight, if you are overweight. For most people, losing 5-10 lbs (2-5 kg) helps lower triglyceride levels. A weight-loss plan may include. ? 30 minutes of exercise at least 5 days a week. ? Reducing the amount of calories, sugar, and fat you eat.  Eat a wide variety of fresh fruits, vegetables, and whole grains. These foods are high in fiber.  Eat foods that contain healthy fats, such as fatty fish, nuts, seeds, and olive oil.  Avoid foods that are high in added sugar, added salt (sodium), saturated fat, and trans fat.  Avoid low-fiber, refined carbohydrates such as white bread, crackers, noodles, and white rice.  Avoid foods with partially hydrogenated oils (trans fats), such as fried foods or stick margarine.  Limit alcohol intake to no more than 1 drink a day for nonpregnant women and 2 drinks a day for men. One drink equals 12 oz of beer, 5 oz of wine, or 1 oz of hard liquor. Your health care provider may recommend that you drink less  depending on your overall health. Reading food labels  Check food labels for the amount of saturated fat. Choose foods with no or very little saturated fat.  Check food labels for the amount of trans fat. Choose foods with no trans fat.  Check food labels for the amount of cholesterol. Choose foods low in cholesterol. Ask your dietitian how much cholesterol you should have each day.  Check food labels for the amount of sodium. Choose foods with less than 140 milligrams (mg) per serving. Shopping  Buy dairy products labeled as nonfat (skim) or low-fat (1%).  Avoid buying processed or prepackaged foods. These are often high in added sugar, sodium, and fat. Cooking  Choose healthy fats when cooking, such as olive oil or canola oil.  Cook foods using lower fat methods, such as baking, broiling, boiling, or grilling.  Make your own sauces, dressings, and marinades when possible, instead of buying them. Store-bought sauces, dressings, and marinades are often high in sodium and sugar. Meal planning  Eat more home-cooked food and less restaurant, buffet, and fast food.  Eat fatty fish at least 2 times each week. Examples of fatty fish include salmon, trout, mackerel, tuna, and herring.  If you eat whole eggs, do not eat more than 3 egg yolks per week. What foods are recommended? The items listed may not be a complete list. Talk with your dietitian about what dietary choices  are best for you. Grains Whole wheat or whole grain breads, crackers, cereals, and pasta. Unsweetened oatmeal. Bulgur. Barley. Quinoa. Brown rice. Whole wheat flour tortillas. Vegetables Fresh or frozen vegetables. Low-sodium canned vegetables. Fruits All fresh, canned (in natural juice), or frozen fruits. Meats and other protein foods Skinless chicken or [place]. Ground chicken or [place]. Lean cuts of pork, trimmed of fat. Fish and seafood, especially salmon, trout, and herring. Egg whites. Dried beans, peas, or  lentils. Unsalted nuts or seeds. Unsalted canned beans. Natural peanut or almond butter. Dairy Low-fat dairy products. Skim or low-fat (1%) milk. Reduced fat (2%) and low-sodium cheese. Low-fat ricotta cheese. Low-fat cottage cheese. Plain, low-fat yogurt. Fats and oils Tub margarine without trans fats. Light or reduced-fat mayonnaise. Light or reduced-fat salad dressings. Avocado. Safflower, olive, sunflower, soybean, and canola oils. What foods are not recommended? The items listed may not be a complete list. Talk with your dietitian about what dietary choices are best for you. Grains White bread. White (regular) pasta. White rice. Cornbread. Bagels. Pastries. Crackers that contain trans fat. Vegetables Creamed or fried vegetables. Vegetables in a cheese sauce. Fruits Sweetened dried fruit. Canned fruit in syrup. Fruit juice. Meats and other protein foods Fatty cuts of meat. Ribs. Chicken wings. Tomasa Blase. Sausage. Bologna. Salami. Chitterlings. Fatback. Hot dogs. Bratwurst. Packaged lunch meats. Dairy Whole or reduced-fat (2%) milk. Half-and-half. Cream cheese. Full-fat or sweetened yogurt. Full-fat cheese. Nondairy creamers. Whipped toppings. Processed cheese or cheese spreads. Cheese curds. Beverages Alcohol. Sweetened drinks, such as soda, lemonade, fruit drinks, or punches. Fats and oils Butter. Stick margarine. Lard. Shortening. Ghee. Bacon fat. Tropical oils, such as coconut, palm kernel, or palm oils. Sweets and desserts Corn syrup. Sugars. Honey. Molasses. Candy. Jam and jelly. Syrup. Sweetened cereals. Cookies. Pies. Cakes. Donuts. Muffins. Ice cream. Condiments Store-bought sauces, dressings, and marinades that are high in sugar, such as ketchup and barbecue sauce. Summary  High levels of triglycerides can increase the risk of heart disease and stroke. Choosing the right foods can help lower your triglycerides.  Eat plenty of fresh fruits, vegetables, and whole grains. Choose  low-fat dairy and lean meats. Eat fatty fish at least twice a week.  Avoid processed and prepackaged foods with added sugar, sodium, saturated fat, and trans fat.  If you need suggestions or have questions about what types of food are good for you, talk with your health care provider or a dietitian. This information is not intended to replace advice given to you by your health care provider. Make sure you discuss any questions you have with your health care provider. Document Revised: 03/04/2017 Document Reviewed: 05/25/2016 Elsevier Patient Education  2020 [employer].

## 2020-01-08 NOTE — Telephone Encounter (Signed)
No change. Continue coreg.

## 2020-02-12 ENCOUNTER — Other Ambulatory Visit: Payer: Self-pay | Admitting: Cardiology

## 2020-02-27 ENCOUNTER — Ambulatory Visit (INDEPENDENT_AMBULATORY_CARE_PROVIDER_SITE_OTHER): Payer: 59

## 2020-02-27 DIAGNOSIS — I255 Ischemic cardiomyopathy: Secondary | ICD-10-CM

## 2020-02-27 DIAGNOSIS — I5042 Chronic combined systolic (congestive) and diastolic (congestive) heart failure: Secondary | ICD-10-CM

## 2020-02-27 LAB — CUP PACEART REMOTE DEVICE CHECK
Battery Remaining Longevity: 16 mo
Battery Remaining Percentage: 16 %
Battery Voltage: 2.74 V
Brady Statistic AP VP Percent: 1 %
Brady Statistic AP VS Percent: 11 %
Brady Statistic AS VP Percent: 1 %
Brady Statistic AS VS Percent: 86 %
Brady Statistic RA Percent Paced: 8.3 %
Brady Statistic RV Percent Paced: 1 %
Date Time Interrogation Session: 20211124041622
HighPow Impedance: 99 Ohm
HighPow Impedance: 99 Ohm
Implantable Lead Implant Date: 20130410
Implantable Lead Implant Date: 20130410
Implantable Lead Location: 753859
Implantable Lead Location: 753860
Implantable Pulse Generator Implant Date: 20130410
Lead Channel Impedance Value: 440 Ohm
Lead Channel Impedance Value: 590 Ohm
Lead Channel Pacing Threshold Amplitude: 1 V
Lead Channel Pacing Threshold Amplitude: 1.25 V
Lead Channel Pacing Threshold Pulse Width: 0.5 ms
Lead Channel Pacing Threshold Pulse Width: 0.5 ms
Lead Channel Sensing Intrinsic Amplitude: 0.8 mV
Lead Channel Sensing Intrinsic Amplitude: 12 mV
Lead Channel Setting Pacing Amplitude: 2 V
Lead Channel Setting Pacing Amplitude: 2.5 V
Lead Channel Setting Pacing Pulse Width: 0.5 ms
Lead Channel Setting Sensing Sensitivity: 0.5 mV
Pulse Gen Serial Number: 1041794

## 2020-03-06 NOTE — Progress Notes (Signed)
Remote ICD transmission.   

## 2020-05-28 ENCOUNTER — Ambulatory Visit (INDEPENDENT_AMBULATORY_CARE_PROVIDER_SITE_OTHER): Payer: 59

## 2020-05-28 DIAGNOSIS — I255 Ischemic cardiomyopathy: Secondary | ICD-10-CM

## 2020-05-29 LAB — CUP PACEART REMOTE DEVICE CHECK
Battery Remaining Longevity: 12 mo
Battery Remaining Percentage: 13 %
Battery Voltage: 2.71 V
Brady Statistic AP VP Percent: 1 %
Brady Statistic AP VS Percent: 12 %
Brady Statistic AS VP Percent: 1 %
Brady Statistic AS VS Percent: 85 %
Brady Statistic RA Percent Paced: 8.8 %
Brady Statistic RV Percent Paced: 1.2 %
Date Time Interrogation Session: 20220223020026
HighPow Impedance: 93 Ohm
HighPow Impedance: 93 Ohm
Implantable Lead Implant Date: 20130410
Implantable Lead Implant Date: 20130410
Implantable Lead Location: 753859
Implantable Lead Location: 753860
Implantable Pulse Generator Implant Date: 20130410
Lead Channel Impedance Value: 400 Ohm
Lead Channel Impedance Value: 600 Ohm
Lead Channel Pacing Threshold Amplitude: 1 V
Lead Channel Pacing Threshold Amplitude: 1.25 V
Lead Channel Pacing Threshold Pulse Width: 0.5 ms
Lead Channel Pacing Threshold Pulse Width: 0.5 ms
Lead Channel Sensing Intrinsic Amplitude: 0.9 mV
Lead Channel Sensing Intrinsic Amplitude: 12 mV
Lead Channel Setting Pacing Amplitude: 2 V
Lead Channel Setting Pacing Amplitude: 2.5 V
Lead Channel Setting Pacing Pulse Width: 0.5 ms
Lead Channel Setting Sensing Sensitivity: 0.5 mV
Pulse Gen Serial Number: 1041794

## 2020-06-06 NOTE — Progress Notes (Signed)
Remote ICD transmission.   

## 2020-06-07 ENCOUNTER — Other Ambulatory Visit: Payer: Self-pay | Admitting: Cardiology

## 2020-08-27 ENCOUNTER — Ambulatory Visit (INDEPENDENT_AMBULATORY_CARE_PROVIDER_SITE_OTHER): Payer: 59

## 2020-08-27 DIAGNOSIS — I255 Ischemic cardiomyopathy: Secondary | ICD-10-CM

## 2020-08-28 LAB — CUP PACEART REMOTE DEVICE CHECK
Battery Remaining Longevity: 8 mo
Battery Remaining Percentage: 9 %
Battery Voltage: 2.66 V
Brady Statistic AP VP Percent: 1 %
Brady Statistic AP VS Percent: 12 %
Brady Statistic AS VP Percent: 1 %
Brady Statistic AS VS Percent: 85 %
Brady Statistic RA Percent Paced: 9.2 %
Brady Statistic RV Percent Paced: 1.3 %
Date Time Interrogation Session: 20220525020018
HighPow Impedance: 100 Ohm
HighPow Impedance: 100 Ohm
Implantable Lead Implant Date: 20130410
Implantable Lead Implant Date: 20130410
Implantable Lead Location: 753859
Implantable Lead Location: 753860
Implantable Pulse Generator Implant Date: 20130410
Lead Channel Impedance Value: 430 Ohm
Lead Channel Impedance Value: 610 Ohm
Lead Channel Pacing Threshold Amplitude: 1 V
Lead Channel Pacing Threshold Amplitude: 1.25 V
Lead Channel Pacing Threshold Pulse Width: 0.5 ms
Lead Channel Pacing Threshold Pulse Width: 0.5 ms
Lead Channel Sensing Intrinsic Amplitude: 1 mV
Lead Channel Sensing Intrinsic Amplitude: 12 mV
Lead Channel Setting Pacing Amplitude: 2 V
Lead Channel Setting Pacing Amplitude: 2.5 V
Lead Channel Setting Pacing Pulse Width: 0.5 ms
Lead Channel Setting Sensing Sensitivity: 0.5 mV
Pulse Gen Serial Number: 1041794

## 2020-09-22 NOTE — Progress Notes (Signed)
Remote ICD transmission.   

## 2020-09-29 ENCOUNTER — Encounter: Payer: Self-pay | Admitting: Adult Health

## 2020-09-29 ENCOUNTER — Ambulatory Visit: Payer: 59 | Admitting: Adult Health

## 2020-11-26 ENCOUNTER — Telehealth: Payer: Self-pay

## 2020-11-26 LAB — CUP PACEART REMOTE DEVICE CHECK
Battery Remaining Longevity: 6 mo
Battery Remaining Percentage: 6 %
Battery Voltage: 2.65 V
Brady Statistic AP VP Percent: 1 %
Brady Statistic AP VS Percent: 12 %
Brady Statistic AS VP Percent: 1 %
Brady Statistic AS VS Percent: 85 %
Brady Statistic RA Percent Paced: 9.2 %
Brady Statistic RV Percent Paced: 1.3 %
Date Time Interrogation Session: 20220824020026
HighPow Impedance: 87 Ohm
HighPow Impedance: 87 Ohm
Implantable Lead Implant Date: 20130410
Implantable Lead Implant Date: 20130410
Implantable Lead Location: 753859
Implantable Lead Location: 753860
Implantable Pulse Generator Implant Date: 20130410
Lead Channel Impedance Value: 390 Ohm
Lead Channel Impedance Value: 580 Ohm
Lead Channel Pacing Threshold Amplitude: 1 V
Lead Channel Pacing Threshold Amplitude: 1.25 V
Lead Channel Pacing Threshold Pulse Width: 0.5 ms
Lead Channel Pacing Threshold Pulse Width: 0.5 ms
Lead Channel Sensing Intrinsic Amplitude: 0.7 mV
Lead Channel Sensing Intrinsic Amplitude: 12 mV
Lead Channel Setting Pacing Amplitude: 2 V
Lead Channel Setting Pacing Amplitude: 2.5 V
Lead Channel Setting Pacing Pulse Width: 0.5 ms
Lead Channel Setting Sensing Sensitivity: 0.5 mV
Pulse Gen Serial Number: 1041794

## 2020-11-26 NOTE — Telephone Encounter (Signed)
Attempted to contact patient to increase monthly battery checks. No answer, LMTCB.

## 2020-11-28 NOTE — Telephone Encounter (Signed)
Attempted to contact patient and wife who is on DPR about monthly battery checks. No answer, LMTCB

## 2020-11-28 NOTE — Telephone Encounter (Signed)
Monthly battery checks scheudled in Epic & Merlin. Patient will just need to be notified.

## 2020-12-05 NOTE — Telephone Encounter (Signed)
Certified Letter sent 12-05-2020

## 2020-12-05 NOTE — Telephone Encounter (Signed)
LMOVM for patient to return device clinic call. 

## 2020-12-12 ENCOUNTER — Telehealth: Payer: Self-pay

## 2020-12-12 NOTE — Telephone Encounter (Signed)
Pt transferred to the [facility] in [place].
